# Patient Record
Sex: Male | Born: 1960 | Race: Black or African American | Hispanic: No | Smoking: Never smoker
Health system: Southern US, Community
[De-identification: ages and names within clinical notes are randomized; demographics above are authoritative.]

## PROBLEM LIST (undated history)

## (undated) DIAGNOSIS — N5082 Scrotal pain: Secondary | ICD-10-CM

## (undated) DIAGNOSIS — I891 Lymphangitis: Secondary | ICD-10-CM

## (undated) DIAGNOSIS — M109 Gout, unspecified: Secondary | ICD-10-CM

## (undated) DIAGNOSIS — R9431 Abnormal electrocardiogram [ECG] [EKG]: Secondary | ICD-10-CM

## (undated) DIAGNOSIS — N529 Male erectile dysfunction, unspecified: Secondary | ICD-10-CM

## (undated) DIAGNOSIS — R339 Retention of urine, unspecified: Secondary | ICD-10-CM

## (undated) DIAGNOSIS — I10 Essential (primary) hypertension: Secondary | ICD-10-CM

## (undated) DIAGNOSIS — E860 Dehydration: Secondary | ICD-10-CM

## (undated) DIAGNOSIS — E119 Type 2 diabetes mellitus without complications: Secondary | ICD-10-CM

## (undated) DIAGNOSIS — S63502A Unspecified sprain of left wrist, initial encounter: Secondary | ICD-10-CM

## (undated) DIAGNOSIS — R42 Dizziness and giddiness: Secondary | ICD-10-CM

## (undated) DIAGNOSIS — E785 Hyperlipidemia, unspecified: Secondary | ICD-10-CM

## (undated) HISTORY — DX: Type 2 diabetes mellitus without complications: E11.9

## (undated) HISTORY — PX: APPENDECTOMY: SHX54

## (undated) HISTORY — DX: Scrotal pain: N50.82

## (undated) HISTORY — DX: Abnormal electrocardiogram (ECG) (EKG): R94.31

## (undated) HISTORY — DX: Retention of urine, unspecified: R33.9

## (undated) HISTORY — DX: Unspecified sprain of left wrist, initial encounter: S63.502A

## (undated) HISTORY — DX: Hyperlipidemia, unspecified: E78.5

## (undated) HISTORY — DX: Dehydration: E86.0

## (undated) HISTORY — PX: COLONOSCOPY: SHX174

## (undated) HISTORY — DX: Lymphangitis: I89.1

## (undated) HISTORY — DX: Male erectile dysfunction, unspecified: N52.9

---

## 1988-12-22 HISTORY — PX: INGUINAL HERNIA REPAIR: SUR1180

## 2013-11-09 ENCOUNTER — Emergency Department (HOSPITAL_COMMUNITY)
Admission: EM | Admit: 2013-11-09 | Discharge: 2013-11-09 | Disposition: A | Discharge status: Home / Self Care | Payer: Self-pay | Attending: Emergency Medicine | Admitting: Emergency Medicine

## 2013-11-09 ENCOUNTER — Emergency Department (INDEPENDENT_AMBULATORY_CARE_PROVIDER_SITE_OTHER): Payer: Self-pay

## 2013-11-09 ENCOUNTER — Emergency Department (HOSPITAL_COMMUNITY)
Admission: EM | Admit: 2013-11-09 | Discharge: 2013-11-09 | Disposition: A | Discharge status: Nursing Home (Includes ICF) | Payer: Self-pay | Source: Home / Self Care | Attending: Emergency Medicine | Admitting: Emergency Medicine

## 2013-11-09 ENCOUNTER — Emergency Department (HOSPITAL_COMMUNITY): Payer: Self-pay

## 2013-11-09 ENCOUNTER — Encounter (HOSPITAL_COMMUNITY): Payer: Self-pay | Admitting: Emergency Medicine

## 2013-11-09 DIAGNOSIS — M542 Cervicalgia: Secondary | ICD-10-CM | POA: Insufficient documentation

## 2013-11-09 DIAGNOSIS — R42 Dizziness and giddiness: Secondary | ICD-10-CM

## 2013-11-09 DIAGNOSIS — I10 Essential (primary) hypertension: Secondary | ICD-10-CM | POA: Insufficient documentation

## 2013-11-09 DIAGNOSIS — M436 Torticollis: Secondary | ICD-10-CM

## 2013-11-09 DIAGNOSIS — M109 Gout, unspecified: Secondary | ICD-10-CM | POA: Insufficient documentation

## 2013-11-09 DIAGNOSIS — Z79899 Other long term (current) drug therapy: Secondary | ICD-10-CM | POA: Insufficient documentation

## 2013-11-09 HISTORY — DX: Essential (primary) hypertension: I10

## 2013-11-09 HISTORY — DX: Gout, unspecified: M10.9

## 2013-11-09 HISTORY — DX: Dizziness and giddiness: R42

## 2013-11-09 LAB — CBC WITH DIFFERENTIAL/PLATELET
BASOS ABS: 0 10*3/uL (ref 0.0–0.1)
BASOS PCT: 0 % (ref 0–1)
Basophils Absolute: 0 10*3/uL (ref 0.0–0.1)
Basophils Relative: 0 % (ref 0–1)
EOS ABS: 0.1 10*3/uL (ref 0.0–0.7)
EOS ABS: 0 10*3/uL (ref 0.0–0.7)
EOS PCT: 1 % (ref 0–5)
Eosinophils Relative: 0 % (ref 0–5)
HCT: 41.4 % (ref 39.0–52.0)
HEMATOCRIT: 39.6 % (ref 39.0–52.0)
Hemoglobin: 13.9 g/dL (ref 13.0–17.0)
Hemoglobin: 13.2 g/dL (ref 13.0–17.0)
Lymphocytes Relative: 24 % (ref 12–46)
Lymphocytes Relative: 17 % (ref 12–46)
Lymphs Abs: 2.5 10*3/uL (ref 0.7–4.0)
Lymphs Abs: 1.6 10*3/uL (ref 0.7–4.0)
MCH: 30 pg (ref 26.0–34.0)
MCH: 29.8 pg (ref 26.0–34.0)
MCHC: 33.6 g/dL (ref 30.0–36.0)
MCHC: 33.3 g/dL (ref 30.0–36.0)
MCV: 89.2 fL (ref 78.0–100.0)
MCV: 89.4 fL (ref 78.0–100.0)
MONO ABS: 0.6 10*3/uL (ref 0.1–1.0)
Monocytes Absolute: 0.6 10*3/uL (ref 0.1–1.0)
Monocytes Relative: 6 % (ref 3–12)
Monocytes Relative: 7 % (ref 3–12)
NEUTROS PCT: 76 % (ref 43–77)
Neutro Abs: 7.2 10*3/uL (ref 1.7–7.7)
Neutro Abs: 7.2 10*3/uL (ref 1.7–7.7)
Neutrophils Relative %: 69 % (ref 43–77)
PLATELETS: 319 10*3/uL (ref 150–400)
Platelets: 292 10*3/uL (ref 150–400)
RBC: 4.64 MIL/uL (ref 4.22–5.81)
RBC: 4.43 MIL/uL (ref 4.22–5.81)
RDW: 13.2 % (ref 11.5–15.5)
RDW: 13.3 % (ref 11.5–15.5)
WBC: 10.3 10*3/uL (ref 4.0–10.5)
WBC: 9.5 10*3/uL (ref 4.0–10.5)

## 2013-11-09 LAB — BASIC METABOLIC PANEL
Anion gap: 15 (ref 5–15)
BUN: 18 mg/dL (ref 6–23)
CHLORIDE: 102 meq/L (ref 96–112)
CO2: 25 mEq/L (ref 19–32)
CREATININE: 1.52 mg/dL — AB (ref 0.50–1.35)
Calcium: 9.5 mg/dL (ref 8.4–10.5)
GFR, EST AFRICAN AMERICAN: 59 mL/min — AB (ref >90)
GFR, EST NON AFRICAN AMERICAN: 51 mL/min — AB (ref >90)
Glucose, Bld: 113 mg/dL — ABNORMAL HIGH (ref 70–99)
Potassium: 4.4 mEq/L (ref 3.7–5.3)
Sodium: 142 mEq/L (ref 137–147)

## 2013-11-09 LAB — I-STAT TROPONIN, ED: Troponin i, poc: 0.01 ng/mL (ref 0.00–0.08)

## 2013-11-09 LAB — URIC ACID: URIC ACID, SERUM: 7.9 mg/dL — AB (ref 4.0–7.8)

## 2013-11-09 MED ORDER — SODIUM CHLORIDE 0.9 % IV SOLN
Freq: Once | INTRAVENOUS | Status: AC
  Administered 2013-11-09: 10:00:00 via INTRAVENOUS

## 2013-11-09 MED ORDER — DIAZEPAM 5 MG/ML IJ SOLN
5.0000 mg | Freq: Once | INTRAMUSCULAR | Status: AC
  Administered 2013-11-09: 5 mg via INTRAVENOUS
  Filled 2013-11-09: qty 2

## 2013-11-09 MED ORDER — DIAZEPAM 5 MG PO TABS: 5.0000 mg | ORAL_TABLET | Freq: Two times a day (BID) | ORAL | Status: DC

## 2013-11-09 MED ORDER — LISINOPRIL 20 MG PO TABS
20.0000 mg | ORAL_TABLET | Freq: Every day | ORAL | Status: DC
  Administered 2013-11-09: 20 mg via ORAL
  Filled 2013-11-09: qty 1

## 2013-11-09 MED ORDER — AMLODIPINE BESYLATE 5 MG PO TABS
10.0000 mg | ORAL_TABLET | Freq: Once | ORAL | Status: AC
  Administered 2013-11-09: 10 mg via ORAL
  Filled 2013-11-09: qty 2

## 2013-11-09 MED ORDER — KETOROLAC TROMETHAMINE 30 MG/ML IJ SOLN
30.0000 mg | Freq: Once | INTRAMUSCULAR | Status: AC
  Administered 2013-11-09: 30 mg via INTRAVENOUS
  Filled 2013-11-09: qty 1

## 2013-11-09 MED ORDER — OXYCODONE-ACETAMINOPHEN 5-325 MG PO TABS: 1.0000 | ORAL_TABLET | ORAL | Status: DC | PRN

## 2013-11-09 NOTE — Discharge Instructions (Signed)
Take the prescribed medication as directed. Follow-up with a primary care physician in the area-- resource guide attached to help with this. Return to the ED for new or worsening symptoms-- increasing pain, severe headache, high fever, chills, confusion, disorientation, changes in speech, difficulty ambulating, unilateral weakness, etc.   Emergency Department Resource Guide 1) Find a Doctor and Pay Out of Pocket Although you won't have to find out who is covered by your insurance plan, it is a good idea to ask around and get recommendations. You will then need to call the office and see if the doctor you have chosen will accept you as a new patient and what types of options they offer for patients who are self-pay. Some doctors offer discounts or will set up payment plans for their patients who do not have insurance, but you will need to ask so you aren't surprised when you get to your appointment.  2) Contact Your Local Health Department Not all health departments have doctors that can see patients for sick visits, but many do, so it is worth a call to see if yours does. If you don't know where your local health department is, you can check in your phone book. The CDC also has a tool to help you locate your state's health department, and many state websites also have listings of all of their local health departments.  3) Find a Walk-in Clinic If your illness is not likely to be very severe or complicated, you may want to try a walk in clinic. These are popping up all over the country in pharmacies, drugstores, and shopping centers. They're usually staffed by nurse practitioners or physician assistants that have been trained to treat common illnesses and complaints. They're usually fairly quick and inexpensive. However, if you have serious medical issues or chronic medical problems, these are probably not your best option.  No Primary Care Doctor: - Call Health Connect at  [phone] - they can help  you locate a primary care doctor that  accepts your insurance, provides certain services, etc. - Physician Referral Service- [phone]  Chronic Pain Problems: Organization         Address  Phone   Notes  [facility]  [phone] Patients need to be referred by their primary care doctor.   Medication Assistance: Organization         Address  Phone   Notes  [facility] [address] [place], [place] [address] [phone]) [phone] --Must be a resident of [facility] [place] -- Must have NO insurance coverage whatsoever (no Medicaid/ Medicare, etc.) -- The pt. MUST have a primary care doctor that directs their care regularly and follows them in the community   [facility]ist  [phone]   [employer]  [phone]    Agencies that provide inexpensive medical care: Organization         Address  Phone   Notes  [facility]  [phone]   [facility]    [phone]   [facility] [address] [place], [place] [address] [phone]) [phone]   [facility] [address] [place], [place] [phone]   [facility]    [phone]) [phone]   [facility]    [phone](336) [phone]   [facility]  [address], [place] Phone:  [phone], Fax:  [phone]) [phone] Hours of Operation:  9 am -  6 pm, M-F.  Also accepts Medicaid/Medicare and self-pay.  [facility]  [address], [place] Phone: [phone], Fax: [phone]. Hours of Operation:  8:30 am - 5:30 pm, M-F.  Also accepts Medicaid and self-pay.  [facility] [place] [address], [place] Phone: [phone]   [facility] [address] Natasha Bence [place], [place] [phone], Ext. [phone] Mondays & Thursdays: 7-9 AM.  First 15 patients are seen on a first come, first serve basis.    Medicaid-accepting [facility] Providers:  Organization         Address  Phone   Notes  [facility] [address], [place] [phone] Also accepts self-pay patients.  [facility] [address] Laurell Josephs [place], [place]  [phone]   [facility] [address], [place] [phone]   [facility] [address] [place] [phone]   Renaye Rakers [address], [place]   [phone] Only accepts [place] Access [place] patients after they have their name applied to their card.   Self-Pay (no insurance) in [facility]:  Organization         Address  Phone   Notes  Sickle Cell Patients, [facility] Internal Medicine [address] [place], [place] [phone]   [facility] [address] [place], [place] [phone]   [facility] [place]  [address], [place] [phone]   [facility]/Dr. Osei-Bonsu  [address], [place] or [address], [place] [phone] Phone number for both [place] and [place] locations is the same.  [facility] [address], [place] [phone]   [facility] [address], [place] or [address] [phone]   [facility] [address] [place] [phone], phone; [phone], fax Sees patients 1st and 3rd Saturday of every month.  Must not qualify for public or private insurance (i.e. Medicaid, Medicare, [place] Health Choice, Veterans' Benefits)  Household income should be no more than 200% of the poverty level The clinic cannot treat you if you are pregnant or think you are pregnant  Sexually transmitted diseases are not treated at the clinic.    Dental Care: Organization         Address  Phone  Notes  [facility]  [facility] [address] [place], [place] [phone] Accepts children up to age 87 who are enrolled in [place] or [place] Health Choice; pregnant women with a Medicaid card; and children who have applied for Medicaid or [place] Health Choice, but were declined, whose parents can pay a reduced fee at time of service.  [facility] Department of [facility]  [address], [place] [phone] Accepts children up to age 56 who are enrolled in [place] or [place] Health Choice; pregnant women with a Medicaid card; and children who have applied for Medicaid or [place] Health Choice, but were declined, whose parents can pay a reduced fee at time of service.  [facility]  [address] [place], [place] [phone] Patients are seen by appointment only. Walk-ins are not accepted. [facility] will see patients 74 years of age and  older. Monday - Tuesday (8am-5pm) Most Wednesdays (8:30-5pm) $30 per visit, cash only  [facility] Adult [employer] PROGRAM  [address], [facility] [phone] Patients are seen by appointment only. Walk-ins are not accepted. [facility] will see patients 58 years of age and older. One Wednesday Evening (Monthly: Volunteer Based).  $30 per visit, cash only  [employer]  [phone] for adults; Children under age 21, call Graduate Pediatric Dentistry at [phone]. Children aged 41-14, please call [phone] to request a pediatric application.  Dental services are provided in all areas of dental care including fillings, crowns and bridges, complete and partial dentures, implants, gum treatment, root canals, and extractions. Preventive care is also provided. Treatment is provided to both adults and children. Patients are selected via a lottery and there is often a waiting list.   [facility] [address], [place]  [phone] [email or web address]   [facility] [address] [place], [place] [phone], Ext. [phone] Second and Fourth Thursday of each month, opens at 6:30 AM; Clinic ends at 9 AM.  Patients are seen on a first-come first-served basis, and a limited number are seen during each clinic.   [facility]  [address] Ether Griffins [place], [place] [phone]   Eligibility Requirements You must have lived in [place], [place], or [place] counties for at least the last three months.   You cannot be eligible for state or federal sponsored [place], including [employer], [place], or [employer].   You generally cannot be eligible for healthcare insurance through your employer.    How to apply: Eligibility screenings are held every Tuesday and Wednesday afternoon from 1:00 pm until 4:00 pm. You do not need an appointment for the interview!  [facility] [address] [place], [place] [phone]   [facility] Health Department  [phone]   [facility] Health Department  [phone]   [facility] Health Department  [phone]    Behavioral Health Resources in the Community: Intensive Outpatient Programs Organization         Address  Phone  Notes  [facility] Services [address] [place], [place] [phone]   [facility] Outpatient [address] [place], [place] [phone]   [facility] [address] [place], [place]  [phone]   [facility] Mental Health [address],  [place], [place] [phone] or [phone]   Substance Abuse Resources Organization         Address  Phone  Notes  [facility] Drug Services  [phone]   [facility]  [phone]   [facility] [place]  [phone]   Floydene Flock  [phone]   Residential & Outpatient Substance Abuse Program  [phone]   Psychological Services Organization         Address  Phone  Notes  [facility]  [phone]   [facility] Services  [phone]   [facility] Mental Health [address], [place] [phone] or [phone]    Mobile Crisis Teams Organization         Address  Phone  Notes  [facility], Mobile Crisis Care Unit  [phone]   [facility]  [address] [place], [place] [phone]   [facility] [address] [place] [phone]    Self-Help/Support Groups Organization         Address  Phone             Notes  [facility] [place] - variety of support groups  336- I7437963 Call for more information  Narcotics Anonymous (NA), [facility] [address], [employer] [place]  2 meetings at this location   Statistician         Address  Phone  Notes  [facility] [address],    [place]  [phone]   [facility]  [address] [place] [address], [place], [place] [phone]   [facility] [address] [place], [place] [phone] Admissions: 8am-3pm M-F  [facility] [address],    [place], [place] [phone]   [facility] [address] [place], [place], [place] [phone]   [facility] [address]  [place], [place] [phone]   [facility] - Intensive Outpatient [address] Laurell Josephs [address], [place], [place] [phone]   [facility] ([facility] [address]  [place], [place] [phone] or [phone]   [facility] ([facility]) [address], [place], [place] [phone] Accepts Medicaid  [facility] [address]  [place] [phone] Substance Abuse/Addiction Treatment   [facility] Organization         Address  Phone  Notes  [facility]  [phone]   Angie Fava, PhD [address] Ervin Knack [place], [place]   (617) [phone] or  9298588434   [facility] Behavioral   [address] [place], [place] [phone]   [facility] [address] [place], [place] [phone] Insurance/Medicaid/sponsorship through [facility] [address]                                    [place], [place] [phone] Therapy/tele-psych/case  [facility] [address], [place] [phone]    Dr. Lolly Mustache  [phone]   [facility]  [employer] [facility] Dept. 1) [address] [place] 2) [address] [place] 3)  [address], [place] [phone]  [phone]   [facility] Child Abuse Hotline 580-562-8822 or [phone] (After Hours)

## 2013-11-09 NOTE — ED Provider Notes (Signed)
Medical screening examination/treatment/procedure(s) were performed by non-physician practitioner and as supervising physician I was immediately available for consultation/collaboration.   EKG Interpretation None      Pt sent for UC to r/o meningitis due to neck pain/stiffness. Pt has recently changed pillow an mattress, has had pain for about 1 week intermittently. He has BL stiffness in SCM's, non meningismus.  No fevers. Negative kernig's and Brudzinski's.  Pt does not clinically have concern for meningitis and does not need LP.   Shanna CiscoMegan E Docherty, MD 11/09/13 [phone]

## 2013-11-09 NOTE — ED Provider Notes (Signed)
Medical screening examination/treatment/procedure(s) were performed by resident physician or non-physician practitioner and as supervising physician I was immediately available for consultation/collaboration.  Randal BubaErin Lacosta Hargan, MD     Charm RingsErin J Jamy Whyte, MD 11/09/13 1556

## 2013-11-09 NOTE — ED Provider Notes (Signed)
CSN: [id number]     Arrival date & time 11/09/13  [id number] History   First MD Initiated Contact with Patient 11/09/13 0915     Chief Complaint  Patient presents with  . Torticollis  . Medication Refill   (Consider location/radiation/quality/duration/timing/severity/associated sxs/prior Treatment) HPI Comments: 53 year old male presents complaining of neck stiffness and pain, and also having run out of his blood pressure medications. He has had neck stiffness and pain for about 2 weeks. It has been constant, not worsening nor improving. He has pain with any attempted movement of his neck. Nothing makes it any better or worse. He does admit to getting a new bed and pillows about one month ago. He denies any trauma to his neck. He thinks that he is having gout in his neck, he has a long history of gout and in the past month has had a flare in his left elbow and right knee. He also admits to some very mild lightheadedness when he first sits up in bed. He denies any headache, back pain, nausea, vomiting, rash, fever, chest pain, shortness of breath, or any other symptoms.   53 year old male presents for evaluation  Past Medical History  Diagnosis Date  . Hypertension   . Gout    History reviewed. No pertinent past surgical history. No family history on file. History  Substance Use Topics  . Smoking status: Never Smoker   . Smokeless tobacco: Not on file  . Alcohol Use: No    Review of Systems  Musculoskeletal: Positive for neck pain and neck stiffness.  Neurological: Positive for dizziness.  All other systems reviewed and are negative.   Allergies  Review of patient's allergies indicates no known allergies.  Home Medications   Prior to Admission medications   Medication Sig Start Date End Date Taking? Authorizing Provider  amLODipine (NORVASC) 10 MG tablet Take 10 mg by mouth daily.   Yes Historical Provider, MD  lisinopril (PRINIVIL,ZESTRIL) 20 MG tablet Take 20 mg by mouth daily.    Yes Historical Provider, MD  tamsulosin (FLOMAX) 0.4 MG CAPS capsule Take 0.4 mg by mouth.   Yes Historical Provider, MD   Filed Vitals:   11/09/13 0901 11/09/13 0940  BP: 144/111 145/85  Pulse: 120 116  Temp: 99.2 F (37.3 C) 99 F (37.2 C)  TempSrc: Oral Oral  Resp: 18 16  Height: 5\' 10"  (1.778 m)   Weight: 230 lb (104.327 kg)   SpO2: 94% 95%     BP 145/85  Pulse 116  Temp(Src) 99 F (37.2 C) (Oral)  Resp 16  Ht 5\' 10"  (1.778 m)  Wt 230 lb (104.327 kg)  BMI 33.00 kg/m2  SpO2 95% Physical Exam  Nursing note and vitals reviewed. Constitutional: He is oriented to person, place, and time. He appears well-developed and well-nourished. No distress.  HENT:  Head: Normocephalic.  Neck: Trachea normal. No JVD present. Spinous process tenderness and muscular tenderness present. Rigidity present. Decreased range of motion (Entire range of motion between 15 and 60 flexion) present. No edema present. No Brudzinski's sign and no Kernig's sign noted.  Cardiovascular: Regular rhythm and normal heart sounds.  Tachycardia present.   Pulses:      Radial pulses are 1+ on the left side.  Pulmonary/Chest: Effort normal and breath sounds normal. No respiratory distress.  Neurological: He is alert and oriented to person, place, and time. Coordination normal.  Skin: Skin is warm and dry. No rash noted. He is not diaphoretic.  Psychiatric: He has a  normal mood and affect. Judgment normal.    ED Course  ED EKG  Date/Time: 11/09/2013 9:58 AM Performed by: Autumn Messing, H Authorized by: Autumn Messing, H Rhythm: sinus rhythm Rate: normal QRS axis: normal Conduction: conduction normal ST Segments: ST segments normal T Waves: T waves normal Other findings: prolonged QTc interval Clinical impression: abnormal ECG   (including critical care time) Labs Review Labs Reviewed  CBC WITH DIFFERENTIAL  URIC ACID    Imaging Review No results found.   MDM   1. Neck stiffness   2.  Dizziness    While attempting to get x-rays of the neck, patient became extremely lightheaded. Radial pulse was faint. He was laid down into November and continues to feel very faint with a thready pulse for a period of 5 minutes until the decision was made to transfer to the emergency department for further evaluation. An EKG that was obtained after this episode of near syncope revealed a heart rate of 78, we did not get an EKG while the patient was having a heart rate of 115 to 120. Given his neck stiffness with abnormal vital signs, this patient needs more workup, possibly needs to have meningitis excluded, and needs further cardiac monitoring to rule out intermittent dysrhythmia. He is being transferred to the emergency department via [facility]. IV started, started 1L bolus, cardiac monitoring. 2 L oxygen via nasal cannula for hypoxemia.      Graylon Good, PA-C 11/09/13 1052

## 2013-11-09 NOTE — ED Notes (Addendum)
During x-ray patient became dizzy and diaphoretic.  Provider notified.  Patient taken to PR2, placed in trendelenburg, VS and EKG were performed

## 2013-11-09 NOTE — ED Notes (Signed)
Report given to [facility]. 

## 2013-11-09 NOTE — ED Notes (Signed)
Per [facility]- [facility] for [facility] of neck pain and stiffness. Pt states that this has been ongoing for 2 weeks and denies any known injury. Pt is also out of BP medications for approx 2 weeks as well. [place] and has not been able to fill medication.

## 2013-11-09 NOTE — ED Provider Notes (Signed)
CSN: [id number]     Arrival date & time 11/09/13  1051 History   First MD Initiated Contact with Patient 11/09/13 1106     Chief Complaint  Patient presents with  . Torticollis     (Consider location/radiation/quality/duration/timing/severity/associated sxs/prior Treatment) The history is provided by the patient and medical records.   This is a 53 year old male with past medical history significant for hypertension, gout, presenting to the ED from urgent care for further evaluation of neck pain. Patient reports that this has been intermittent over the past week. He states pain is worse along his posterior neck, equally painful on both sides. He endorses an intermittent, low-grade headache over the past week as well that resolves with over-the-counter medications.  Denies current headache.  No fever or chills.  Denies any photophobia, phonophobia, aura, changes in speech, difficulty in relating, tinnitus, or confusion. Patient does admit to getting a new bed and pillows about one month ago since moving here from [place].  States he has hx of gout in his right foot and pain in his neck feels the same way.  Denies recent injuries, trauma, or falls. No prior neck injuries or surgeries.  No epidural injections.  Pt also notes he has been out of his blood pressure medications, amlodipine and lisinopril, for the past 2 weeks. States blood pressure is usually well controlled when he takes these medications.  While getting x-ray of [facility] spine at [facility] patient became dizzy while standing, but did not have full syncope.  He was sent to the ED for further evaluation.  VS stable on arrival.  Past Medical History  Diagnosis Date  . Hypertension   . Gout   . Dizziness    Past Surgical History  Procedure Laterality Date  . Appendectomy     No family history on file. History  Substance Use Topics  . Smoking status: Never Smoker   . Smokeless tobacco: Not on file  . Alcohol Use: Yes    Review of  Systems  Musculoskeletal: Positive for neck pain.  Neurological: Positive for dizziness.  All other systems reviewed and are negative.     Allergies  Review of patient's allergies indicates no known allergies.  Home Medications   Prior to Admission medications   Medication Sig Start Date End Date Taking? Authorizing Provider  amLODipine (NORVASC) 10 MG tablet Take 10 mg by mouth daily.   Yes Historical Provider, MD  lisinopril (PRINIVIL,ZESTRIL) 20 MG tablet Take 20 mg by mouth daily.   Yes Historical Provider, MD  tamsulosin (FLOMAX) 0.4 MG CAPS capsule Take 0.4 mg by mouth daily.    Yes Historical Provider, MD   BP 150/119  Pulse 105  Temp(Src) 97.7 F (36.5 C) (Oral)  Resp 22  SpO2 95%  Physical Exam  Nursing note and vitals reviewed. Constitutional: He is oriented to person, place, and time. He appears well-developed and well-nourished.  Non-toxic appearance. No distress.  Awake, alert, non-toxic appearing  HENT:  Head: Normocephalic and atraumatic.  Mouth/Throat: Oropharynx is clear and moist.  Eyes: Conjunctivae and EOM are normal. Pupils are equal, round, and reactive to light.  Neck: Trachea normal and phonation normal. Spinous process tenderness and muscular tenderness present. No rigidity. No Brudzinski's sign and no Kernig's sign noted.  No nuchal rigidity or meningeal signs; tenderness along cervical spine and paraspinal muscles with significant spasm present along SCM's bilaterally; fully able to place chin on chest  Cardiovascular: Normal rate, regular rhythm and normal heart sounds.  Pulmonary/Chest: Effort normal and breath sounds normal. No respiratory distress. He has no wheezes.  Abdominal: Soft. Bowel sounds are normal. There is no tenderness. There is no guarding.  Musculoskeletal: Normal range of motion. He exhibits no edema.  Neurological: He is alert and oriented to person, place, and time.  AAOx3, answering questions appropriately; equal strength UE  and LE bilaterally; CN grossly intact; moves all extremities appropriately without ataxia; no focal neuro deficits or facial asymmetry appreciated  Skin: Skin is warm and dry. He is not diaphoretic.  Psychiatric: He has a normal mood and affect.    ED Course  Procedures (including critical care time) Labs Review Labs Reviewed  BASIC METABOLIC PANEL - Abnormal; Notable for the following:    Glucose, Bld 113 (*)    Creatinine, Ser 1.52 (*)    GFR calc non Af Amer 51 (*)    GFR calc Af Amer 59 (*)    All other components within normal limits  CBC WITH DIFFERENTIAL  I-STAT TROPOININ, ED    Imaging Review Dg Cervical Spine 2 Or 3 Views  11/09/2013   CLINICAL DATA:  Neck pain for 2 weeks  EXAM: CERVICAL SPINE - 2-3 VIEW  COMPARISON:  None.  FINDINGS: The cervicothoracic junction is not optimally visualized due to obscuration by soft tissue structures. Alignment is normal. No precervical soft tissue widening. No fracture or dislocation identified. Neural foramina are patent.  IMPRESSION: No acute osseous abnormality of the cervical spine. The patient experienced diaphoresis and dizziness during the examination and was [facility] for further evaluation in stable condition.   Electronically Signed   By: Christiana PellantGretchen  Green M.D.   On: 11/09/2013 10:09     EKG Interpretation None      MDM   Final diagnoses:  Neck pain  Essential hypertension   53 year old male sent to the ED from [facility] for further evaluation of neck pain.  [facility], no syncope or falls.  On arrival to ED, pt states he is feeling fine but felt that he may have stood up too quickly which caused his dizziness.  He denies current headache, fever, sweats, chills, chest pain, SOB, palpitations, dizziness, visual disturbance, confusion, or weakness.  On exam, his neck feels tense with significant spasm present along his SCM muscles  bilaterally but he is able to fully place his chin on his chest and does not have any appreciable nuchal rigidity.  His neurologic exam is non-focal.  Have ordered IV toradol and valium.  Will obtain EKG, basic labs, and cxr.  C-spine films from UC reviewed-- no acute findings.  EKG sinus rhythm without ischemic change. Troponin is negative.  CXR is clear.  Labs with mild renal insufficiency, unknown baseline as no prior labs for comparison.  After medications, the patient feels that his neck pain has improved significantly. He is now able to range his neck more freely.  He remains afebrile and non-toxic appearing.  Neurologic exam remains intact.  At this time i have low suspicion for acute intracranial pathology including, but not limited to, TIA, stroke, ICH, SAH, or meningitis.  Will plan to discharge home with pain meds and supportive care.  Encouraged to monitor for worsening sx-- severe headache, intense pain, visual changes, weakness, high fever, chills, sweats, etc and will return to the ED if any of these sx occur.  Discussed plan with patient, he/she acknowledged understanding and agreed with plan  of care.  Return precautions given for new or worsening symptoms.  Case discussed with Dr. Micheline Maze who personally evaluated pt and agrees with assessment and plan of care.  Garlon Hatchet, PA-C 11/09/13 1511

## 2013-11-09 NOTE — ED Notes (Signed)
Pt c/o stiff neck x2 weeks Denies inj/trauma... Hx of gout Also needing refill on BP meds; BP today is 144/111 Last had BP meds yest Denies SOB, CP, weakness, v/n, diaphoresis Talking in complete sentences w/no signs of acute distress.

## 2013-11-11 NOTE — Discharge Planning (Signed)
[facility] Liaison was not able to see pt, [facility] orange card information will be sent to the address listed. °

## 2015-01-03 ENCOUNTER — Encounter: Payer: Self-pay | Admitting: Internal Medicine

## 2015-01-28 ENCOUNTER — Ambulatory Visit (AMBULATORY_SURGERY_CENTER): Payer: Self-pay

## 2015-01-28 VITALS — Ht 70.0 in | Wt 240.2 lb

## 2015-01-28 DIAGNOSIS — Z1211 Encounter for screening for malignant neoplasm of colon: Secondary | ICD-10-CM

## 2015-01-28 MED ORDER — SUPREP BOWEL PREP KIT 17.5-3.13-1.6 GM/177ML PO SOLN: 1.0000 | Freq: Once | ORAL | Status: DC

## 2015-01-28 NOTE — Progress Notes (Signed)
No allergies to eggs or soy No diet/weight loss meds No home oxygen No past problems to anesthesia  Refused emmi; wont watch it

## 2015-02-09 ENCOUNTER — Ambulatory Visit (AMBULATORY_SURGERY_CENTER): Payer: 59 | Admitting: Internal Medicine

## 2015-02-09 ENCOUNTER — Encounter: Payer: Self-pay | Admitting: Internal Medicine

## 2015-02-09 VITALS — BP 141/108 | HR 91 | Temp 97.8°F | Resp 30 | Wt 240.0 lb

## 2015-02-09 DIAGNOSIS — Z1211 Encounter for screening for malignant neoplasm of colon: Secondary | ICD-10-CM

## 2015-02-09 MED ORDER — SODIUM CHLORIDE 0.9 % IV SOLN: 500.0000 mL | INTRAVENOUS | Status: DC

## 2015-02-09 NOTE — Op Note (Signed)
[facility] [address] [place], [id number]   COLONOSCOPY PROCEDURE REPORT  PATIENT: Tony Dean, Tony Dean  MR#: [id number] BIRTHDATE: [birth year] , 54  yrs. old GENDER: male ENDOSCOPIST: Beverley FiedlerJay M Ceri Mayer, MD REFERRED AO:ZHYQMBY:Edwin Concepcion ElkAvbuere, M.D. PROCEDURE DATE:  02/09/2015 PROCEDURE:   Colonoscopy, screening First Screening Colonoscopy - Avg.  risk and is 50 yrs.  old or older Yes.  Prior Negative Screening - Now for repeat screening. N/A  History of Adenoma - Now for follow-up colonoscopy & has been > or = to 3 yrs.  N/A  Polyps removed today? No Recommend repeat exam, <10 yrs? Yes poor prep ASA CLASS:   Class II INDICATIONS:Screening for colonic neoplasia, Colorectal Neoplasm Risk Assessment for this procedure is average risk, and 1st colonoscopy. MEDICATIONS: Monitored anesthesia care and Propofol 250 mg IV  DESCRIPTION OF PROCEDURE:   After the risks benefits and alternatives of the procedure were thoroughly explained, informed consent was obtained.  The digital rectal exam revealed no rectal mass.   The LB [id number]  endoscope was introduced through the anus and advanced to the cecum, which was identified by both the appendix and ileocecal valve. No adverse events experienced. The quality of the prep was fair.  (Suprep was used)  The instrument was then slowly withdrawn as the colon was fully examined. Estimated blood loss is zero unless otherwise noted in this procedure report.   COLON FINDINGS: Fair bowel preparation requiring copious irrigation and lavage. Despite irrigation and lavage there was adherent stool which can limit detection of small lesions.  A normal appearing cecum, ileocecal valve, and appendiceal orifice were identified. The ascending, transverse, descending, sigmoid colon, and rectum appeared unremarkable.  Retroflexed views revealed internal hemorrhoids. The time to cecum = 1.8 Withdrawal time = 12.9   The scope was withdrawn and the  procedure completed.  COMPLICATIONS: There were no immediate complications.  ENDOSCOPIC IMPRESSION: Normal colonoscopy with fair preparation  RECOMMENDATIONS: Given bowel preparation today, limiting the detection of small lesions, would recommend repeat colonoscopy in 3 years versus Cologuard at that time  eSigned:  Beverley FiedlerJay M Cythnia Osmun, MD 02/09/2015 2:01 PM   cc: Fleet ContrasEdwin Avbuere, MD and The Patient

## 2015-02-09 NOTE — Patient Instructions (Signed)
Discharge instructions given. Normal exam. Resume previous medications. YOU HAD AN ENDOSCOPIC PROCEDURE TODAY AT THE [facility]:   Refer to the procedure report that was given to you for any specific questions about what was found during the examination.  If the procedure report does not answer your questions, please call your gastroenterologist to clarify.  If you requested that your care partner not be given the details of your procedure findings, then the procedure report has been included in a sealed envelope for you to review at your convenience later.  YOU SHOULD EXPECT: Some feelings of bloating in the abdomen. Passage of more gas than usual.  Walking can help get rid of the air that was put into your GI tract during the procedure and reduce the bloating. If you had a lower endoscopy (such as a colonoscopy or flexible sigmoidoscopy) you may notice spotting of blood in your stool or on the toilet paper. If you underwent a bowel prep for your procedure, you may not have a normal bowel movement for a few days.  Please Note:  You might notice some irritation and congestion in your nose or some drainage.  This is from the oxygen used during your procedure.  There is no need for concern and it should clear up in a day or so.  SYMPTOMS TO REPORT IMMEDIATELY:   Following lower endoscopy (colonoscopy or flexible sigmoidoscopy):  Excessive amounts of blood in the stool  Significant tenderness or worsening of abdominal pains  Swelling of the abdomen that is new, acute  Fever of 100F or higher   For urgent or emergent issues, a gastroenterologist can be reached at any hour by calling [phone].   DIET: Your first meal following the procedure should be a small meal and then it is ok to progress to your normal diet. Heavy or fried foods are harder to digest and may make you feel nauseous or bloated.  Likewise, meals heavy in dairy and vegetables can increase bloating.  Drink plenty  of fluids but you should avoid alcoholic beverages for 24 hours.  ACTIVITY:  You should plan to take it easy for the rest of today and you should NOT DRIVE or use heavy machinery until tomorrow (because of the sedation medicines used during the test).    FOLLOW UP: Our staff will call the number listed on your records the next business day following your procedure to check on you and address any questions or concerns that you may have regarding the information given to you following your procedure. If we do not reach you, we will leave a message.  However, if you are feeling well and you are not experiencing any problems, there is no need to return our call.  We will assume that you have returned to your regular daily activities without incident.  If any biopsies were taken you will be contacted by phone or by letter within the next 1-3 weeks.  Please call us at [phone] if you have not heard about the biopsies in 3 weeks.    SIGNATURES/CONFIDENTIALITY: You and/or your care partner have signed paperwork which will be entered into your electronic medical record.  These signatures attest to the fact that that the information above on your After Visit Summary has been reviewed and is understood.  Full responsibility of the confidentiality of this discharge information lies with you and/or your care-partner. 

## 2015-02-09 NOTE — Progress Notes (Signed)
A/ox3 pleased with MAC, report to Celia RN 

## 2015-02-10 ENCOUNTER — Telehealth: Payer: Self-pay | Admitting: *Deleted

## 2015-02-10 NOTE — Telephone Encounter (Signed)
#   left by pt for callback said this number can not be reached at this time

## 2015-10-19 ENCOUNTER — Emergency Department (HOSPITAL_COMMUNITY)
Admission: EM | Admit: 2015-10-19 | Discharge: 2015-10-19 | Disposition: A | Discharge status: Home / Self Care | Payer: 59 | Attending: Emergency Medicine | Admitting: Emergency Medicine

## 2015-10-19 ENCOUNTER — Encounter (HOSPITAL_COMMUNITY): Payer: Self-pay | Admitting: Emergency Medicine

## 2015-10-19 DIAGNOSIS — R000 Tachycardia, unspecified: Secondary | ICD-10-CM | POA: Insufficient documentation

## 2015-10-19 DIAGNOSIS — I10 Essential (primary) hypertension: Secondary | ICD-10-CM

## 2015-10-19 DIAGNOSIS — M10031 Idiopathic gout, right wrist: Secondary | ICD-10-CM | POA: Insufficient documentation

## 2015-10-19 DIAGNOSIS — M10071 Idiopathic gout, right ankle and foot: Secondary | ICD-10-CM | POA: Insufficient documentation

## 2015-10-19 DIAGNOSIS — M1009 Idiopathic gout, multiple sites: Secondary | ICD-10-CM

## 2015-10-19 DIAGNOSIS — Z79899 Other long term (current) drug therapy: Secondary | ICD-10-CM | POA: Insufficient documentation

## 2015-10-19 MED ORDER — PREDNISONE 20 MG PO TABS
60.0000 mg | ORAL_TABLET | Freq: Once | ORAL | Status: AC
  Administered 2015-10-19: 60 mg via ORAL
  Filled 2015-10-19: qty 3

## 2015-10-19 MED ORDER — IBUPROFEN 400 MG PO TABS: 400.0000 mg | ORAL_TABLET | Freq: Three times a day (TID) | ORAL | Status: DC | PRN

## 2015-10-19 MED ORDER — HYDROCODONE-ACETAMINOPHEN 5-325 MG PO TABS
1.0000 | ORAL_TABLET | Freq: Four times a day (QID) | ORAL | Status: DC | PRN
Start: 2015-10-19 — End: 2017-07-01

## 2015-10-19 MED ORDER — PREDNISONE 20 MG PO TABS: ORAL_TABLET | ORAL | Status: DC

## 2015-10-19 MED ORDER — HYDROCODONE-ACETAMINOPHEN 5-325 MG PO TABS
1.0000 | ORAL_TABLET | Freq: Once | ORAL | Status: AC
  Administered 2015-10-19: 1 via ORAL
  Filled 2015-10-19: qty 1

## 2015-10-19 NOTE — Discharge Instructions (Signed)
Ice and elevate ankle and wrist throughout the day, using ice pack for no more than 20 minutes every hour.  Alternate between ibuprofen and norco for pain relief. Do not drive or operate machinery with pain medication use. Take prednisone as directed, beginning tomorrow since you already got today's dose here in the ER; this will help with gout flare. AVOID ALCOHOL! Avoid red meat and seafood consumption. Follow up with your regular doctor in 1 week for recheck and ongoing management of your gout. Return to the ER for changes or worsening symptoms.   Gout Gout is when your joints become red, sore, and swell (inflamed). This is caused by the buildup of uric acid crystals in the joints. Uric acid is a chemical that is normally in the blood. If the level of uric acid gets too high in the blood, these crystals form in your joints and tissues. Over time, these crystals can form into masses near the joints and tissues. These masses can destroy bone and cause the bone to look misshapen (deformed). HOME CARE   Do not take aspirin for pain.  Only take medicine as told by your doctor.  Rest the joint as much as you can. When in bed, keep sheets and blankets off painful areas.  Keep the sore joints raised (elevated).  Put warm or cold packs on painful joints. Use of warm or cold packs depends on which works best for you.  Use crutches if the painful joint is in your leg.  Drink enough fluids to keep your pee (urine) clear or pale yellow. Limit alcohol, sugary drinks, and drinks with fructose in them.  Follow your diet instructions. Pay careful attention to how much protein you eat. Include fruits, vegetables, whole grains, and fat-free or low-fat milk products in your daily diet. Talk to your doctor or dietitian about the use of coffee, vitamin C, and cherries. These may help lower uric acid levels.  Keep a healthy body weight. GET HELP RIGHT AWAY IF:   You have watery poop (diarrhea), throw up  (vomit), or have any side effects from medicines.  You do not feel better in 24 hours, or you are getting worse.  Your joint becomes suddenly more tender, and you have chills or a fever. MAKE SURE YOU:   Understand these instructions.  Will watch your condition.  Will get help right away if you are not doing well or get worse.   This information is not intended to replace advice given to you by your health care provider. Make sure you discuss any questions you have with your health care provider.   Document Released: 01/17/2008 Document Revised: 04/30/2014 Document Reviewed: 11/21/2011 Elsevier Interactive Patient Education 2016 Elsevier Inc.  Low-Purine Diet Purines are compounds that affect the level of uric acid in your body. A low-purine diet is a diet that is low in purines. Eating a low-purine diet can prevent the level of uric acid in your body from getting too high and causing gout or kidney stones or both. WHAT DO I NEED TO KNOW ABOUT THIS DIET?  Choose low-purine foods. Examples of low-purine foods are listed in the next section.  Drink plenty of fluids, especially water. Fluids can help remove uric acid from your body. Try to drink 8-16 cups (1.9-3.8 L) a day.  Limit foods high in fat, especially saturated fat, as fat makes it harder for the body to get rid of uric acid. Foods high in saturated fat include pizza, cheese, ice cream, whole  milk, fried foods, and gravies. Choose foods that are lower in fat and lean sources of protein. Use olive oil when cooking as it contains healthy fats that are not high in saturated fat.  Limit alcohol. Alcohol interferes with the elimination of uric acid from your body. If you are having a gout attack, avoid all alcohol.  Keep in mind that different people's bodies react differently to different foods. You will probably learn over time which foods do or do not affect you. If you discover that a food tends to cause your gout to flare up,  avoid eating that food. You can more freely enjoy foods that do not cause problems. If you have any questions about a food item, talk to your dietitian or health care provider. WHICH FOODS ARE LOW, MODERATE, AND HIGH IN PURINES? The following is a list of foods that are low, moderate, and high in purines. You can eat any amount of the foods that are low in purines. You may be able to have small amounts of foods that are moderate in purines. Ask your health care provider how much of a food moderate in purines you can have. Avoid foods high in purines. Grains  Foods low in purines: Enriched white bread, pasta, rice, cake, cornbread, popcorn.  Foods moderate in purines: Whole-grain breads and cereals, wheat germ, bran, oatmeal. Uncooked oatmeal. Dry wheat bran or wheat germ.  Foods high in purines: Pancakes, [place] toast, biscuits, muffins. Vegetables  Foods low in purines: All vegetables, except those that are moderate in purines.  Foods moderate in purines: Asparagus, cauliflower, spinach, mushrooms, green peas. Fruits  All fruits are low in purines. Meats and other Protein Foods  Foods low in purines: Eggs, nuts, peanut butter.  Foods moderate in purines: 80-90% lean beef, lamb, veal, pork, poultry, fish, eggs, peanut butter, nuts. Crab, lobster, oysters, and shrimp. Cooked dried beans, peas, and lentils.  Foods high in purines: Anchovies, sardines, herring, mussels, tuna, codfish, scallops, trout, and haddock. Tomasa BlaseBacon. Organ meats (such as liver or kidney). Tripe. Game meat. Goose. Sweetbreads. Dairy  All dairy foods are low in purines. Low-fat and fat-free dairy products are best because they are low in saturated fat. Beverages  Drinks low in purines: Water, carbonated beverages, tea, coffee, cocoa.  Drinks moderate in purines: Soft drinks and other drinks sweetened with high-fructose corn syrup. Juices. To find whether a food or drink is sweetened with high-fructose corn syrup, look  at the ingredients list.  Drinks high in purines: Alcoholic beverages (such as beer). Condiments  Foods low in purines: Salt, herbs, olives, pickles, relishes, vinegar.  Foods moderate in purines: Butter, margarine, oils, mayonnaise. Fats and Oils  Foods low in purines: All types, except gravies and sauces made with meat.  Foods high in purines: Gravies and sauces made with meat. Other Foods  Foods low in purines: Sugars, sweets, gelatin. Cake. Soups made without meat.  Foods moderate in purines: Meat-based or fish-based soups, broths, or bouillons. Foods and drinks sweetened with high-fructose corn syrup.  Foods high in purines: High-fat desserts (such as ice cream, cookies, cakes, pies, doughnuts, and chocolate). Contact your dietitian for more information on foods that are not listed here.   This information is not intended to replace advice given to you by your health care provider. Make sure you discuss any questions you have with your health care provider.   Document Released: 08/04/2010 Document Revised: 04/14/2013 Document Reviewed: 03/16/2013 Elsevier Interactive Patient Education [employer]2016 Elsevier Inc.

## 2015-10-19 NOTE — ED Provider Notes (Signed)
CSN: [id number]     Arrival date & time 10/19/15  2106 History  By signing my name below, I, Placido SouLogan Joldersma, attest that this documentation has been prepared under the direction and in the presence of Tilley Faeth Camprubi-Soms, PA-C. Electronically Signed: Placido SouLogan Joldersma, ED Scribe. 10/19/2015. 9:48 PM.   Chief Complaint  Patient presents with  . Gout   Patient is a 55 y.o. male presenting with ankle pain. The history is provided by the patient and medical records. No language interpreter was used.  Ankle Pain Location:  Ankle Time since incident:  1 week Injury: no   Ankle location:  R ankle Pain details:    Quality:  Sharp   Radiates to:  Does not radiate   Severity:  Moderate   Onset quality:  Gradual   Duration:  1 week   Timing:  Intermittent   Progression:  Improving Chronicity:  Recurrent Prior injury to area:  No Relieved by:  Nothing Worsened by:  Bearing weight and activity Ineffective treatments:  Acetaminophen Associated symptoms: decreased ROM (due to pain in R wrist), stiffness and swelling   Associated symptoms: no fever, no muscle weakness, no numbness and no tingling   Risk factors: no concern for non-accidental trauma and no obesity     HPI Comments: Tony HusbandsKenneth Dean is a 55 y.o. male with a PMHx of HTN and gout, who presents to the Emergency Department complaining of gout flare in his R ankle and R wrist. States that his symptoms began after consuming ETOH 1wk ago, first in his R ankle, although that has improved, and then went into his R hand/wrist. States this feels like prior gout flares, and he has had gout issues in these locations in the past. Describes the R wrist/hand pain as gradually worsening, intermittent, sharp, non-radiating, 10/10, right wrist pain, worsens with movement, and unrelieved with tylenol. Associated symptoms include swelling of the R ankle and wrist, and limited ROM of the R wrist due to pain. Pt states his last gout flare up was 1 year ago,  went to an outside hospital for tx at that time, and is unsure of whether or not they rx him a medication for his symptoms. He is not on anything for his gout. Pt denies a PMHx of DM and confirms a PMHx of HTN noting that he has taken his rx medication  this morning, but can't recall the name of the medication since he states it is newly changed. He denies any known drug allergies. He denies joint warmth, redness, fevers, chills, CP, SOB, abd pain, n/v/d/c, dysuria, hematuria, weakness, numbness, tingling, or any other associated symptoms. Of note, Upon chart review pt on 11/09/2013 pt had uric acid levels performed which were elevated.    Past Medical History  Diagnosis Date  . Hypertension   . Gout   . Dizziness    Past Surgical History  Procedure Laterality Date  . Appendectomy    . Inguinal hernia repair  1990's    right   Family History  Problem Relation Age of Onset  . Colon cancer Neg Hx    Social History  Substance Use Topics  . Smoking status: Never Smoker   . Smokeless tobacco: Never Used  . Alcohol Use: 13.2 - 14.4 oz/week    14 Cans of beer, 8-10 Shots of liquor per week    Review of Systems  Constitutional: Negative for fever and chills.  Respiratory: Negative for shortness of breath.   Cardiovascular: Negative for chest pain.  Gastrointestinal: Negative for nausea, vomiting, abdominal pain, diarrhea and constipation.  Genitourinary: Negative for dysuria and hematuria.  Musculoskeletal: Positive for joint swelling, arthralgias and stiffness. Negative for myalgias.  Skin: Negative for color change and wound.  Allergic/Immunologic: Negative for immunocompromised state.  Neurological: Negative for weakness and numbness.  Psychiatric/Behavioral: Negative for confusion.  10 Systems reviewed and are negative for acute change except as noted in the HPI.   Allergies  Review of patient's allergies indicates no known allergies.  Home Medications   Prior to Admission  medications   Medication Sig Start Date End Date Taking? Authorizing Provider  lisinopril (PRINIVIL,ZESTRIL) 20 MG tablet Take 20 mg by mouth daily.    Historical Provider, MD  Multiple Vitamins-Minerals (MENS 50+ MULTI VITAMIN/MIN PO) Take by mouth.    Historical Provider, MD   BP 151/118 mmHg  Pulse 111  Temp(Src) 98 F (36.7 C)  Resp 24  Ht  (1.778 m)  Wt 240 lb (108.863 kg)  BMI 34.44 kg/m2  SpO2 97%    Physical Exam  Constitutional: He is oriented to person, place, and time. Vital signs are normal. He appears well-developed and well-nourished.  Non-toxic appearance. No distress.  Afebrile, nontoxic, NAD  HENT:  Head: Normocephalic and atraumatic.  Mouth/Throat: Mucous membranes are normal.  Eyes: Conjunctivae and EOM are normal. Right eye exhibits no discharge. Left eye exhibits no discharge.  Neck: Normal range of motion. Neck supple.  Cardiovascular: Intact distal pulses.  Tachycardia present.   Mild tachycardia noted in triage which improved during exam.   Pulmonary/Chest: Effort normal. No respiratory distress.  Abdominal: Normal appearance. He exhibits no distension.  Musculoskeletal:       Right wrist: He exhibits decreased range of motion (due to pain), tenderness and swelling. He exhibits no crepitus, no deformity and no laceration.       Right ankle: He exhibits swelling. He exhibits normal range of motion, no deformity, no laceration and normal pulse. No tenderness. Achilles tendon normal.  Right wrist with limited ROM due to pain although still able to passively flex and extend approximately 50%, with mild diffuse tenderness in the wrist and hand without any focal bony TTP, no erythema or warmth, no crepitus or deformity, with mild swelling to the wrist. Skin intact. Right ankle with FROM intact, no focal TTP, mildly swollen without erythema or warmth, no pedal edema, no crepitus or deformity.  Strength and sensation grossly intact in all extremities, distal  pulses intact.   Neurological: He is alert and oriented to person, place, and time. He has normal strength. No sensory deficit.  Skin: Skin is warm, dry and intact. No rash noted.  Psychiatric: He has a normal mood and affect.  Nursing note and vitals reviewed.   ED Course  Procedures  DIAGNOSTIC STUDIES: Oxygen Saturation is 97% on RA, normal by my interpretation.    COORDINATION OF CARE: 9:46 PM Discussed next steps with pt. Pt verbalized understanding and is agreeable with the plan.   Labs Review Labs Reviewed - No data to display  Imaging Review No results found.   EKG Interpretation None      MDM   Final diagnoses:  Acute idiopathic gout of multiple sites  HTN (hypertension), benign    55 y.o. male here with acute gout flare in R ankle and R wrist x1wk since consuming alcohol. States he doesn't usually take meds for gout, has documented elevated uric acid 30yrs ago so this is likely an accurate diagnosis. No trauma or  injury. No warmth/redness, doubt septic joint. Pt mildly tachycardic but this resolved during exam, likely pain related. Doubt need for imaging, no focal bony TTP, mostly just in R wrist diffusely throughout; some swelling noted in R wrist and R ankle. NVI with soft compartments. Chart review reveals only one lab draw in our system, from 10/2013, Cr was slightly elevated, so given this information will avoid anything that could be nephrotoxic since I don't know his current kidney function. Will use prednisone and gentle NSAIDs for gout flare. Norco rx for pain. Diet modifications discussed. Discussed RICE or warm compress for pain. F/up with PCP in 1wk for recheck and ongoing management. I explained the diagnosis and have given explicit precautions to return to the ER including for any other new or worsening symptoms. The patient understands and accepts the medical plan as it's been dictated and I have answered their questions. Discharge instructions concerning home  care and prescriptions have been given. The patient is STABLE and is discharged to home in good condition.   I personally performed the services described in this documentation, which was scribed in my presence. The recorded information has been reviewed and is accurate.  BP 151/118 mmHg  Pulse 111  Temp(Src) 98 F (36.7 C)  Resp 24  Ht 5\' 10"  (1.778 m)  Wt 108.863 kg  BMI 34.44 kg/m2  SpO2 97%  Meds ordered this encounter  Medications  . HYDROcodone-acetaminophen (NORCO/VICODIN) 5-325 MG per tablet 1 tablet    Sig:   . predniSONE (DELTASONE) tablet 60 mg    Sig:   . predniSONE (DELTASONE) 20 MG tablet    Sig: 3 tabs po daily x 4 days starting 10/20/15    Dispense:  12 tablet    Refill:  0    Order Specific Question:  Supervising Provider    Answer:  Eber HongMILLER, BRIAN [[id number]]  . HYDROcodone-acetaminophen (NORCO) 5-325 MG tablet    Sig: Take 1 tablet by mouth every 6 (six) hours as needed for severe pain.    Dispense:  8 tablet    Refill:  0    Order Specific Question:  Supervising Provider    Answer:  MILLER, BRIAN [[id number]]  . ibuprofen (ADVIL,MOTRIN) 400 MG tablet    Sig: Take 1 tablet (400 mg total) by mouth every 8 (eight) hours as needed for mild pain or moderate pain.    Dispense:  30 tablet    Refill:  0    Order Specific Question:  Supervising Provider    Answer:  Eber HongMILLER, BRIAN [[id number]]       Maxamillian Tienda Camprubi-Soms, PA-C 10/19/15 2157  Gwyneth SproutWhitney Plunkett, MD 10/21/15 1450

## 2015-10-19 NOTE — ED Notes (Signed)
Pt reports history of gout and states he feels he's having a "flare of his gout". Pt c/o of pain in right ankle and right hand.

## 2016-04-10 ENCOUNTER — Encounter (HOSPITAL_COMMUNITY): Payer: Self-pay

## 2016-04-10 ENCOUNTER — Ambulatory Visit (HOSPITAL_COMMUNITY)
Admission: EM | Admit: 2016-04-10 | Discharge: 2016-04-10 | Disposition: A | Discharge status: Other Acute Inpatient Hospital | Payer: 59

## 2016-04-10 ENCOUNTER — Emergency Department (HOSPITAL_COMMUNITY)
Admission: EM | Admit: 2016-04-10 | Discharge: 2016-04-10 | Disposition: A | Discharge status: Home / Self Care | Payer: 59 | Attending: Emergency Medicine | Admitting: Emergency Medicine

## 2016-04-10 DIAGNOSIS — Z79899 Other long term (current) drug therapy: Secondary | ICD-10-CM | POA: Insufficient documentation

## 2016-04-10 DIAGNOSIS — I10 Essential (primary) hypertension: Secondary | ICD-10-CM

## 2016-04-10 DIAGNOSIS — M25512 Pain in left shoulder: Secondary | ICD-10-CM

## 2016-04-10 DIAGNOSIS — M25511 Pain in right shoulder: Secondary | ICD-10-CM

## 2016-04-10 DIAGNOSIS — M62838 Other muscle spasm: Secondary | ICD-10-CM | POA: Insufficient documentation

## 2016-04-10 MED ORDER — DIAZEPAM 5 MG PO TABS
5.0000 mg | ORAL_TABLET | Freq: Once | ORAL | Status: AC
  Administered 2016-04-10: 5 mg via ORAL
  Filled 2016-04-10: qty 1

## 2016-04-10 MED ORDER — LISINOPRIL 20 MG PO TABS: 20.0000 mg | ORAL_TABLET | Freq: Once | ORAL | Status: DC

## 2016-04-10 MED ORDER — HYDROCODONE-ACETAMINOPHEN 5-325 MG PO TABS: 1.0000 | ORAL_TABLET | ORAL | 0 refills | Status: DC | PRN

## 2016-04-10 MED ORDER — LISINOPRIL 20 MG PO TABS
20.0000 mg | ORAL_TABLET | Freq: Once | ORAL | Status: AC
  Administered 2016-04-10: 20 mg via ORAL
  Filled 2016-04-10: qty 1

## 2016-04-10 MED ORDER — NAPROXEN 375 MG PO TABS: 375.0000 mg | ORAL_TABLET | Freq: Two times a day (BID) | ORAL | 0 refills | Status: AC | PRN

## 2016-04-10 MED ORDER — LISINOPRIL 20 MG PO TABS: 20.0000 mg | ORAL_TABLET | Freq: Every day | ORAL | 0 refills | Status: DC

## 2016-04-10 MED ORDER — CYCLOBENZAPRINE HCL 10 MG PO TABS: 10.0000 mg | ORAL_TABLET | Freq: Three times a day (TID) | ORAL | 0 refills | Status: DC | PRN

## 2016-04-10 MED ORDER — HYDROCODONE-ACETAMINOPHEN 5-325 MG PO TABS
1.0000 | ORAL_TABLET | Freq: Once | ORAL | Status: AC
  Administered 2016-04-10: 1 via ORAL
  Filled 2016-04-10: qty 1

## 2016-04-10 MED ORDER — KETOROLAC TROMETHAMINE 60 MG/2ML IM SOLN
60.0000 mg | Freq: Once | INTRAMUSCULAR | Status: AC
  Administered 2016-04-10: 60 mg via INTRAMUSCULAR
  Filled 2016-04-10: qty 2

## 2016-04-10 NOTE — ED Notes (Signed)
Dr. Penne LashIssacs and Shanda BumpsJessica - RN aware of pt's BP.

## 2016-04-10 NOTE — ED Provider Notes (Signed)
[facility]-EMERGENCY DEPT Provider Note   CSN: [id number] Arrival date & time: 04/10/16  1152     History   Chief Complaint Chief Complaint  Patient presents with  . Shoulder Pain  . Headache    HPI Tony Dean is a 55 y.o. male.  HPI 55 year old male with no significant past medical history beyond poorly controlled hypertension who presents with bilateral neck pain. The patient states that several days ago, he fell asleep on a couch. When he awoke, he had mild, aching, throbbing bilateral neck pain radiating up to the base of his skull. Over the last several days, the pain has persistently worsened. It seems to improve mildly when he is still at night but when he wakes up, he has extreme stiffness and pain. Describes the pain as an aching, stiffening, cramping sensation along the bilateral aspect of both of his neck with radiation to his back. Denies any associated chest pain or shortness of breath. No anterior neck pain. No vision changes. No recent falls. No upper or lower extremity numbness or weakness. No other medical complaints. Recent fevers or chills.  Past Medical History:  Diagnosis Date  . Dizziness   . Gout   . Hypertension     There are no active problems to display for this patient.   Past Surgical History:  Procedure Laterality Date  . APPENDECTOMY    . INGUINAL HERNIA REPAIR  1990's   right       Home Medications    Prior to Admission medications   Medication Sig Start Date End Date Taking? Authorizing Provider  cyclobenzaprine (FLEXERIL) 10 MG tablet Take 1 tablet (10 mg total) by mouth 3 (three) times daily as needed for muscle spasms. 04/10/16   Shaune Pollackameron Arnette Driggs, MD  HYDROcodone-acetaminophen (NORCO) 5-325 MG tablet Take 1 tablet by mouth every 6 (six) hours as needed for severe pain. 10/19/15   Mercedes Camprubi-Soms, PA-C  HYDROcodone-acetaminophen (NORCO/VICODIN) 5-325 MG tablet Take 1-2 tablets by mouth every 4 (four) hours as needed. 04/10/16    Shaune Pollackameron Zeek Rostron, MD  ibuprofen (ADVIL,MOTRIN) 400 MG tablet Take 1 tablet (400 mg total) by mouth every 8 (eight) hours as needed for mild pain or moderate pain. 10/19/15   Mercedes Camprubi-Soms, PA-C  lisinopril (PRINIVIL,ZESTRIL) 20 MG tablet Take 1 tablet (20 mg total) by mouth daily. 04/10/16   Shaune Pollackameron Katana Berthold, MD  Multiple Vitamins-Minerals (MENS 50+ MULTI VITAMIN/MIN PO) Take by mouth.    Historical Provider, MD  naproxen (NAPROSYN) 375 MG tablet Take 1 tablet (375 mg total) by mouth 2 (two) times daily as needed for moderate pain. 04/10/16 04/17/16  Shaune Pollackameron Maejor Erven, MD  predniSONE (DELTASONE) 20 MG tablet 3 tabs po daily x 4 days starting 10/20/15 10/19/15   Mercedes Camprubi-Soms, PA-C    Family History Family History  Problem Relation Age of Onset  . Colon cancer Neg Hx     Social History Social History  Substance Use Topics  . Smoking status: Never Smoker  . Smokeless tobacco: Never Used  . Alcohol use 13.2 - 14.4 oz/week    14 Cans of beer, 8 - 10 Shots of liquor per week     Allergies   Patient has no known allergies.   Review of Systems Review of Systems  Constitutional: Negative for chills, fatigue and fever.  HENT: Negative for congestion and rhinorrhea.   Eyes: Negative for visual disturbance.  Respiratory: Negative for cough, shortness of breath and wheezing.   Cardiovascular: Negative for chest pain and leg swelling.  Gastrointestinal: Negative for abdominal pain, diarrhea, nausea and vomiting.  Genitourinary: Negative for dysuria and flank pain.  Musculoskeletal: Positive for arthralgias, back pain, neck pain and neck stiffness.  Skin: Negative for rash and wound.  Allergic/Immunologic: Negative for immunocompromised state.  Neurological: Negative for syncope, weakness and headaches.  All other systems reviewed and are negative.    Physical Exam Updated Vital Signs BP (!) 183/136 (BP Location: Left Arm)   Pulse 113   Temp 97.4 F (36.3 C) (Oral)   Resp  18   Ht 5\' 10"  (1.778 m)   Wt 230 lb (104.3 kg)   SpO2 94%   BMI 33.00 kg/m   Physical Exam  Constitutional: He is oriented to person, place, and time. He appears well-developed and well-nourished. He appears distressed (Appears to be in pain).  HENT:  Head: Normocephalic and atraumatic.  Eyes: Conjunctivae are normal.  Neck: Neck supple.  Significant tenderness to palpation bilaterally over superior aspect of trapezius and insertion of sternocleidomastoid over the mastoid. There is no palpable bony deformity. There is no overlying erythema. No associated lymphadenopathy. Intermittent palpable spasms were appreciated. No carotid bruits. Phonation is normal.  Cardiovascular: Normal rate, regular rhythm and normal heart sounds.  Exam reveals no friction rub.   No murmur heard. Pulmonary/Chest: Effort normal and breath sounds normal. No respiratory distress. He has no wheezes. He has no rales.  Abdominal: He exhibits no distension.  Musculoskeletal: He exhibits no edema.  Neurological: He is alert and oriented to person, place, and time. He exhibits normal muscle tone.  Motor strength 5 out of 5 in bilateral upper and lower extremities. Reflexes 2+ and symmetric throughout. Normal sensation to light touch distally.  Skin: Skin is warm. Capillary refill takes less than 2 seconds.  Psychiatric: He has a normal mood and affect.  Nursing note and vitals reviewed.    ED Treatments / Results  Labs (all labs ordered are listed, but only abnormal results are displayed) Labs Reviewed - No data to display  EKG  EKG Interpretation None       Radiology No results found.  Procedures Procedures (including critical care time)  Medications Ordered in ED Medications  ketorolac (TORADOL) injection 60 mg (60 mg Intramuscular Given 04/10/16 1301)  diazepam (VALIUM) tablet 5 mg (5 mg Oral Given 04/10/16 1301)  HYDROcodone-acetaminophen (NORCO/VICODIN) 5-325 MG per tablet 1 tablet (1 tablet  Oral Given 04/10/16 1301)  lisinopril (PRINIVIL,ZESTRIL) tablet 20 mg (20 mg Oral Given 04/10/16 1348)     Initial Impression / Assessment and Plan / ED Course  I have reviewed the triage vital signs and the nursing notes.  Pertinent labs & imaging results that were available during my care of the patient were reviewed by me and considered in my medical decision making (see chart for details).  Clinical Course     56 year old male who presents with reproducible, bilateral neck pain after sleeping in a different position. On arrival, patient is notably tachycardic and hypertensive, likely due to pain and distress, although patient states this is his baseline and he is poorly adherent with his blood pressure medications. Per review of records, patient is consistently with heart rate 100 to 110s during all previous ED visits as well as markedly hypertensive in this range. He denies any vision changes, chest pain, shortness breath, or other evidence to suggest hypertensive urgency. Regarding his neck pain, the history and exam is highly consistent with musculoskeletal pain with trapezius spasm. He has no focal numbness, weakness, or  evidence to suggest cervical radiculopathy. He has no sore throat, normal phonation, no evidence to suggest RPA or anterior pharyngeal abnormality. No fever, well appearing and I do not suspect meningitis or encephalitis. He strongly denies any chest pain and I do not suspect is referred ACS. Will treat with muscle relaxants, NSAIDs, and brief course of analgesics. I have also refilled his blood pressure medications and encouraged him to follow up with PCP. I counseled him on the risks of continued nonadherence with this regimen and the risks of persistently elevated heart rate and blood pressure.  Final Clinical Impressions(s) / ED Diagnoses   Final diagnoses:  Acute pain of both shoulders  Trapezius muscle spasm  Essential hypertension    New Prescriptions Discharge  Medication List as of 04/10/2016  1:33 PM    START taking these medications   Details  cyclobenzaprine (FLEXERIL) 10 MG tablet Take 1 tablet (10 mg total) by mouth 3 (three) times daily as needed for muscle spasms., Starting Tue 04/10/2016, Print    !! HYDROcodone-acetaminophen (NORCO/VICODIN) 5-325 MG tablet Take 1-2 tablets by mouth every 4 (four) hours as needed., Starting Tue 04/10/2016, Print    naproxen (NAPROSYN) 375 MG tablet Take 1 tablet (375 mg total) by mouth 2 (two) times daily as needed for moderate pain., Starting Tue 04/10/2016, Until Tue 04/17/2016, Print     !! - Potential duplicate medications found. Please discuss with provider.       Shaune Pollackameron Andren Bethea, MD 04/10/16 [phone]

## 2016-04-10 NOTE — ED Triage Notes (Signed)
Per Pt, Pt is coming from home with complaints of left shoulder pain, neck pain, and headache that started three days ago. Pt reports no injury noted. Denies Hx of the same, rash, N/V/D, or cough. Reports some nasal congestion.

## 2017-07-01 ENCOUNTER — Encounter (INDEPENDENT_AMBULATORY_CARE_PROVIDER_SITE_OTHER): Payer: Self-pay | Admitting: Physician Assistant

## 2017-07-01 ENCOUNTER — Other Ambulatory Visit: Payer: Self-pay

## 2017-07-01 ENCOUNTER — Ambulatory Visit (INDEPENDENT_AMBULATORY_CARE_PROVIDER_SITE_OTHER): Payer: Self-pay | Admitting: Physician Assistant

## 2017-07-01 VITALS — BP 162/118 | HR 107 | Temp 97.9°F | Ht 70.47 in | Wt 232.8 lb

## 2017-07-01 DIAGNOSIS — R350 Frequency of micturition: Secondary | ICD-10-CM | POA: Diagnosis not present

## 2017-07-01 DIAGNOSIS — R7303 Prediabetes: Secondary | ICD-10-CM

## 2017-07-01 DIAGNOSIS — Z114 Encounter for screening for human immunodeficiency virus [HIV]: Secondary | ICD-10-CM

## 2017-07-01 DIAGNOSIS — I10 Essential (primary) hypertension: Secondary | ICD-10-CM

## 2017-07-01 DIAGNOSIS — Z131 Encounter for screening for diabetes mellitus: Secondary | ICD-10-CM | POA: Diagnosis not present

## 2017-07-01 DIAGNOSIS — Z1159 Encounter for screening for other viral diseases: Secondary | ICD-10-CM | POA: Diagnosis not present

## 2017-07-01 DIAGNOSIS — Z23 Encounter for immunization: Secondary | ICD-10-CM | POA: Diagnosis not present

## 2017-07-01 DIAGNOSIS — F1010 Alcohol abuse, uncomplicated: Secondary | ICD-10-CM

## 2017-07-01 LAB — POCT URINALYSIS DIPSTICK
Bilirubin, UA: NEGATIVE
Glucose, UA: NEGATIVE
KETONES UA: NEGATIVE
Leukocytes, UA: NEGATIVE
NITRITE UA: NEGATIVE
PROTEIN UA: 30
Spec Grav, UA: 1.025 (ref 1.010–1.025)
Urobilinogen, UA: 0.2 E.U./dL
pH, UA: 5 (ref 5.0–8.0)

## 2017-07-01 LAB — POCT GLYCOSYLATED HEMOGLOBIN (HGB A1C): Hemoglobin A1C: 6.4

## 2017-07-01 MED ORDER — LISINOPRIL 40 MG PO TABS: 40.0000 mg | ORAL_TABLET | Freq: Every day | ORAL | 1 refills | Status: DC

## 2017-07-01 MED ORDER — AMLODIPINE BESYLATE 10 MG PO TABS: 10.0000 mg | ORAL_TABLET | Freq: Every day | ORAL | 1 refills | Status: DC

## 2017-07-01 NOTE — Patient Instructions (Signed)

## 2017-07-01 NOTE — Progress Notes (Signed)
Subjective:  Patient ID: Tony Dean, male    DOB: 30-Dec-1960  Age: 57 y.o. MRN: [id number]  CC: Gout and HTN  HPI Tony Dean is a 57 y.o. male with a medical history of gout, alcohol abuse, and HTN presents as a new patient for management of gout and HTN. Says he has not had a gout flare since approximately "a couple of months to almost a year".  Gout usually affects talar region of the right ankle. Risk factors include drinking alcohol. Drinks 2-3 40 oz beers and some liquors almost on a daily basis. Says he likes to drink and does not think he wants to quit, "maybe I will slow down". Wants a pill for urinary frequency. Also has trouble with erections.      Has not taken anti-hypertensives for "some time". Endorses occasional headache, exertional dyspnea and PND "sometimes".  Does not endorse CP, palpitations, abdominal pain, f/c/n/v, rash, or GI sxs.        Outpatient Medications Prior to Visit  Medication Sig Dispense Refill  . cyclobenzaprine (FLEXERIL) 10 MG tablet Take 1 tablet (10 mg total) by mouth 3 (three) times daily as needed for muscle spasms. (Patient not taking: Reported on 07/01/2017) 30 tablet 0  . HYDROcodone-acetaminophen (NORCO) 5-325 MG tablet Take 1 tablet by mouth every 6 (six) hours as needed for severe pain. (Patient not taking: Reported on 07/01/2017) 8 tablet 0  . HYDROcodone-acetaminophen (NORCO/VICODIN) 5-325 MG tablet Take 1-2 tablets by mouth every 4 (four) hours as needed. (Patient not taking: Reported on 07/01/2017) 12 tablet 0  . ibuprofen (ADVIL,MOTRIN) 400 MG tablet Take 1 tablet (400 mg total) by mouth every 8 (eight) hours as needed for mild pain or moderate pain. (Patient not taking: Reported on 07/01/2017) 30 tablet 0  . lisinopril (PRINIVIL,ZESTRIL) 20 MG tablet Take 1 tablet (20 mg total) by mouth daily. (Patient not taking: Reported on 07/01/2017) 30 tablet 0  . Multiple Vitamins-Minerals (MENS 50+ MULTI VITAMIN/MIN PO) Take by mouth.    .  predniSONE (DELTASONE) 20 MG tablet 3 tabs po daily x 4 days starting 10/20/15 (Patient not taking: Reported on 07/01/2017) 12 tablet 0   No facility-administered medications prior to visit.      ROS Review of Systems  Constitutional: Negative for chills, fever and malaise/fatigue.  Eyes: Negative for blurred vision.  Respiratory: Negative for shortness of breath.   Cardiovascular: Negative for chest pain and palpitations.  Gastrointestinal: Negative for abdominal pain and nausea.  Genitourinary: Negative for dysuria and hematuria.  Musculoskeletal: Negative for joint pain and myalgias.  Skin: Negative for rash.  Neurological: Negative for tingling and headaches.  Psychiatric/Behavioral: Negative for depression. The patient is not nervous/anxious.     Objective:  BP (!) 174/127 (BP Location: Right Arm, Patient Position: Sitting, Cuff Size: Large)   Pulse (!) 107   Temp 97.9 F (36.6 C) (Oral)   Ht 5' 10.47" (1.79 m)   Wt 232 lb 12.8 oz (105.6 kg)   SpO2 93%   BMI 32.96 kg/m   BP/Weight 07/01/2017 04/10/2016 10/19/2015  Systolic BP 174 183 151  Diastolic BP 127 136 118  Wt. (Lbs) 232.8 230 240  BMI 32.96 33 34.44      Physical Exam  Constitutional: He is oriented to person, place, and time.  Well developed, well nourished, NAD, polite  HENT:  Head: Normocephalic and atraumatic.  Eyes: No scleral icterus.  Neck: Normal range of motion. Neck supple. No thyromegaly present.  Cardiovascular: Normal rate, regular  rhythm and normal heart sounds.  Pulmonary/Chest: Effort normal and breath sounds normal.  Abdominal: Soft. Bowel sounds are normal. There is no tenderness.  Musculoskeletal: He exhibits no edema.  Neurological: He is alert and oriented to person, place, and time.  Skin: Skin is warm and dry. No rash noted. No erythema. No pallor.  Psychiatric: He has a normal mood and affect. His behavior is normal. Thought content normal.  Vitals reviewed.    Assessment &  Plan:     1. Hypertension, unspecified type - CBC with Differential - Comprehensive metabolic panel - Lipid panel - TSH - Uric Acid - amLODipine (NORVASC) 10 MG tablet; Take 1 tablet (10 mg total) by mouth daily.  Dispense: 90 tablet; Refill: 1 - lisinopril (PRINIVIL,ZESTRIL) 40 MG tablet; Take 1 tablet (40 mg total) by mouth daily.  Dispense: 90 tablet; Refill: 1  2. Screening for diabetes mellitus - HgB A1c 6.4% - Will start on Metformin depending on renal clearance  3. Urinary frequency - Urinalysis Dipstick small blood  4. Alcohol abuse - Patient counseled on detriment of alcoholism on the body. - I have messaged Jasmine Lewis LCSW to contact patient in regards to alcoholism  5. Screening for HIV (human immunodeficiency virus) - HIV antibody  6. Encounter for hepatitis C screening test for low risk patient - Hepatitis c antibody (reflex)  7. Need for Tdap vaccination - Tdap vaccine greater than or equal to 7yo IM  8. Need for prophylactic vaccination and inoculation against influenza - Flu Vaccine QUAD 6+ mos PF IM (Fluarix Quad PF)  9. Prediabetes - 6.4% in clinic today  Meds ordered this encounter  Medications  . amLODipine (NORVASC) 10 MG tablet    Sig: Take 1 tablet (10 mg total) by mouth daily.    Dispense:  90 tablet    Refill:  1    Order Specific Question:   Supervising Provider    Answer:   Quentin Angst [id number]  . lisinopril (PRINIVIL,ZESTRIL) 40 MG tablet    Sig: Take 1 tablet (40 mg total) by mouth daily.    Dispense:  90 tablet    Refill:  1    Order Specific Question:   Supervising Provider    Answer:   Quentin Angst [[id number]]    Follow-up: 4 weeks   Loletta Specter PA

## 2017-07-02 ENCOUNTER — Telehealth (INDEPENDENT_AMBULATORY_CARE_PROVIDER_SITE_OTHER): Payer: Self-pay

## 2017-07-02 LAB — CBC WITH DIFFERENTIAL/PLATELET
BASOS: 0 %
Basophils Absolute: 0 10*3/uL (ref 0.0–0.2)
EOS (ABSOLUTE): 0.1 10*3/uL (ref 0.0–0.4)
EOS: 1 %
HEMATOCRIT: 39.4 % (ref 37.5–51.0)
HEMOGLOBIN: 13.3 g/dL (ref 13.0–17.7)
IMMATURE GRANS (ABS): 0 10*3/uL (ref 0.0–0.1)
IMMATURE GRANULOCYTES: 0 %
LYMPHS: 38 %
Lymphocytes Absolute: 1.8 10*3/uL (ref 0.7–3.1)
MCH: 30 pg (ref 26.6–33.0)
MCHC: 33.8 g/dL (ref 31.5–35.7)
MCV: 89 fL (ref 79–97)
MONOCYTES: 8 %
Monocytes Absolute: 0.4 10*3/uL (ref 0.1–0.9)
NEUTROS PCT: 53 %
Neutrophils Absolute: 2.5 10*3/uL (ref 1.4–7.0)
PLATELETS: 232 10*3/uL (ref 150–379)
RBC: 4.43 x10E6/uL (ref 4.14–5.80)
RDW: 13.9 % (ref 12.3–15.4)
WBC: 4.8 10*3/uL (ref 3.4–10.8)

## 2017-07-02 LAB — COMPREHENSIVE METABOLIC PANEL
A/G RATIO: 1.3 (ref 1.2–2.2)
ALBUMIN: 4.2 g/dL (ref 3.5–5.5)
ALT: 24 IU/L (ref 0–44)
AST: 23 IU/L (ref 0–40)
Alkaline Phosphatase: 102 IU/L (ref 39–117)
BUN/Creatinine Ratio: 11 (ref 9–20)
BUN: 18 mg/dL (ref 6–24)
Bilirubin Total: <0.2 mg/dL (ref 0.0–1.2)
CALCIUM: 8.6 mg/dL — AB (ref 8.7–10.2)
CO2: 22 mmol/L (ref 20–29)
Chloride: 105 mmol/L (ref 96–106)
Creatinine, Ser: 1.6 mg/dL — ABNORMAL HIGH (ref 0.76–1.27)
GFR, EST AFRICAN AMERICAN: 55 mL/min/{1.73_m2} — AB (ref >59)
GFR, EST NON AFRICAN AMERICAN: 47 mL/min/{1.73_m2} — AB (ref >59)
Globulin, Total: 3.3 g/dL (ref 1.5–4.5)
Glucose: 120 mg/dL — ABNORMAL HIGH (ref 65–99)
POTASSIUM: 4.2 mmol/L (ref 3.5–5.2)
Sodium: 143 mmol/L (ref 134–144)
TOTAL PROTEIN: 7.5 g/dL (ref 6.0–8.5)

## 2017-07-02 LAB — HIV ANTIBODY (ROUTINE TESTING W REFLEX): HIV Screen 4th Generation wRfx: NONREACTIVE

## 2017-07-02 LAB — LIPID PANEL
CHOL/HDL RATIO: 3 ratio (ref 0.0–5.0)
Cholesterol, Total: 210 mg/dL — ABNORMAL HIGH (ref 100–199)
HDL: 69 mg/dL (ref >39)
LDL Calculated: 108 mg/dL — ABNORMAL HIGH (ref 0–99)
Triglycerides: 163 mg/dL — ABNORMAL HIGH (ref 0–149)
VLDL Cholesterol Cal: 33 mg/dL (ref 5–40)

## 2017-07-02 LAB — TSH: TSH: 0.24 u[IU]/mL — AB (ref 0.450–4.500)

## 2017-07-02 LAB — HEPATITIS C ANTIBODY (REFLEX)

## 2017-07-02 LAB — URIC ACID: Uric Acid: 9.6 mg/dL — ABNORMAL HIGH (ref 3.7–8.6)

## 2017-07-02 LAB — HCV COMMENT:

## 2017-07-02 NOTE — Telephone Encounter (Signed)
Patient aware of all results and has scheduled appointment. Maryjean Mornempestt S Roberts, CMA

## 2017-07-02 NOTE — Telephone Encounter (Signed)
-----   Message from Loletta Specteroger David Gomez, PA-C sent at 07/02/2017  8:49 AM EDT ----- Irregular TSH, cholesterol elevated, uric acid elevated, and kidney function decreased. Pt should return within one week for explanation and treatment.

## 2017-07-03 ENCOUNTER — Telehealth: Payer: Self-pay | Admitting: Licensed Clinical Social Worker

## 2017-07-03 NOTE — Telephone Encounter (Signed)
[facility] attempted to contact pt to follow up on behavioral health needs. The number on file is not currently in service.

## 2017-07-04 NOTE — Telephone Encounter (Signed)
Noted. Contact info will be asked for on f/u appointment.

## 2017-07-10 ENCOUNTER — Ambulatory Visit (INDEPENDENT_AMBULATORY_CARE_PROVIDER_SITE_OTHER): Payer: BLUE CROSS/BLUE SHIELD | Admitting: Physician Assistant

## 2017-07-10 ENCOUNTER — Other Ambulatory Visit: Payer: Self-pay

## 2017-07-10 ENCOUNTER — Encounter (INDEPENDENT_AMBULATORY_CARE_PROVIDER_SITE_OTHER): Payer: Self-pay | Admitting: Physician Assistant

## 2017-07-10 VITALS — BP 159/98 | HR 86 | Temp 97.7°F | Wt 228.0 lb

## 2017-07-10 DIAGNOSIS — E790 Hyperuricemia without signs of inflammatory arthritis and tophaceous disease: Secondary | ICD-10-CM

## 2017-07-10 DIAGNOSIS — F1010 Alcohol abuse, uncomplicated: Secondary | ICD-10-CM

## 2017-07-10 DIAGNOSIS — I10 Essential (primary) hypertension: Secondary | ICD-10-CM | POA: Diagnosis not present

## 2017-07-10 DIAGNOSIS — R7989 Other specified abnormal findings of blood chemistry: Secondary | ICD-10-CM | POA: Diagnosis not present

## 2017-07-10 DIAGNOSIS — E7841 Elevated Lipoprotein(a): Secondary | ICD-10-CM | POA: Diagnosis not present

## 2017-07-10 MED ORDER — ACAMPROSATE CALCIUM 333 MG PO TBEC: 666.0000 mg | DELAYED_RELEASE_TABLET | Freq: Three times a day (TID) | ORAL | 0 refills | Status: AC

## 2017-07-10 MED ORDER — ATORVASTATIN CALCIUM 20 MG PO TABS: 20.0000 mg | ORAL_TABLET | Freq: Every day | ORAL | 3 refills | Status: DC

## 2017-07-10 MED ORDER — ALLOPURINOL 100 MG PO TABS: 100.0000 mg | ORAL_TABLET | Freq: Every day | ORAL | 6 refills | Status: DC

## 2017-07-10 NOTE — Progress Notes (Signed)
Subjective:  Patient ID: Tony Dean, male    DOB: [birth year]  Age: 57 y.o. MRN: [id number]  CC: results  HPI Tony Dean is a 57 y.o. male with a medical history of gout, alcohol abuse, and HTN presents to f/u on abnormal results. He had irregular TSH, LDL/trigs elevated, uric acid elevated, glucose elevated, and kidney function decreased. BP is also noted to be similar to the last visit nine days ago. Reports taking medications as directed. Does not consume salty foods and sleeps well. However, he reports heavy alcohol use at times. Drank one 40oz and one 25 oz beer last night. Does not endorse any symptoms or complaints.      Outpatient Medications Prior to Visit  Medication Sig Dispense Refill  . amLODipine (NORVASC) 10 MG tablet Take 1 tablet (10 mg total) by mouth daily. 90 tablet 1  . lisinopril (PRINIVIL,ZESTRIL) 40 MG tablet Take 1 tablet (40 mg total) by mouth daily. 90 tablet 1  . Multiple Vitamins-Minerals (MENS 50+ MULTI VITAMIN/MIN PO) Take by mouth.     No facility-administered medications prior to visit.      ROS Review of Systems  Constitutional: Negative for chills, fever and malaise/fatigue.  Eyes: Negative for blurred vision.  Respiratory: Negative for shortness of breath.   Cardiovascular: Negative for chest pain and palpitations.  Gastrointestinal: Negative for abdominal pain and nausea.  Genitourinary: Negative for dysuria and hematuria.  Musculoskeletal: Negative for joint pain and myalgias.  Skin: Negative for rash.  Neurological: Negative for tingling and headaches.  Psychiatric/Behavioral: Negative for depression. The patient is not nervous/anxious.     Objective:  BP (!) 161/104 (BP Location: Right Arm, Patient Position: Sitting, Cuff Size: Large)   Pulse 86   Temp 97.7 F (36.5 C) (Oral)   Wt 228 lb (103.4 kg)   SpO2 97%   BMI 32.28 kg/m   BP/Weight 07/10/2017 07/01/2017 04/10/2016  Systolic BP 161 162 183  Diastolic BP 104 118 136   Wt. (Lbs) 228 232.8 230  BMI 32.28 32.96 33      Physical Exam  Constitutional: He is oriented to person, place, and time.  Well developed, well nourished, NAD, polite  HENT:  Head: Normocephalic and atraumatic.  Eyes glazed over  Eyes: No scleral icterus.  Neck: Normal range of motion. Neck supple. No thyromegaly present.  Cardiovascular: Normal rate, regular rhythm and normal heart sounds.  Pulmonary/Chest: Effort normal and breath sounds normal.  Musculoskeletal: He exhibits no edema.  Neurological: He is alert and oriented to person, place, and time. No cranial nerve deficit. Coordination normal.  Skin: Skin is warm and dry. No rash noted. No erythema. No pallor.  Psychiatric: He has a normal mood and affect. His behavior is normal. Thought content normal.  Vitals reviewed.    Assessment & Plan:    1. Alcohol abuse - Problem with binge drinking. I have counseled patient on the detriments of alcohol use. I have also developed a strategy to gradually decrease the amount of alcohol he consumes. Pt in agreement and will implement advice given.  - Begin acamprosate (CAMPRAL) 333 MG tablet; Take 2 tablets (666 mg total) by mouth 3 (three) times daily with meals.  Dispense: 180 tablet; Refill: 0 - Drug Screen, Urine  2. Hypertension, unspecified type - Thyroid Panel With TSH  3. Abnormal TSH - Thyroid Panel With TSH  4. Elevated lipoprotein(a) - Begin atorvastatin (LIPITOR) 20 MG tablet; Take 1 tablet (20 mg total) by mouth daily.  Dispense: 90  tablet; Refill: 3  5. Hyperuricemia - Begin allopurinol (ZYLOPRIM) 100 MG tablet; Take 1 tablet (100 mg total) by mouth daily.  Dispense: 30 tablet; Refill: 6 - Creatinine clearance calculated at 75 ml/min with Cockcroft-Gault equation  6. Elevated serum creatinine  - Basic Metabolic Panel   Meds ordered this encounter  Medications  . acamprosate (CAMPRAL) 333 MG tablet    Sig: Take 2 tablets (666 mg total) by mouth 3 (three)  times daily with meals.    Dispense:  180 tablet    Refill:  0    Order Specific Question:   Supervising Provider    Answer:   Quentin AngstJEGEDE, OLUGBEMIGA E [id number]  . atorvastatin (LIPITOR) 20 MG tablet    Sig: Take 1 tablet (20 mg total) by mouth daily.    Dispense:  90 tablet    Refill:  3    Order Specific Question:   Supervising Provider    Answer:   Quentin AngstJEGEDE, OLUGBEMIGA E [id number]  . allopurinol (ZYLOPRIM) 100 MG tablet    Sig: Take 1 tablet (100 mg total) by mouth daily.    Dispense:  30 tablet    Refill:  6    Order Specific Question:   Supervising Provider    Answer:   Quentin AngstJEGEDE, OLUGBEMIGA E [id number]    Follow-up: Return in about 4 weeks (around 08/07/2017) for alcohol abuse.   Loletta Specteroger David Sherlie Boyum PA

## 2017-07-10 NOTE — Patient Instructions (Signed)

## 2017-07-11 ENCOUNTER — Telehealth (INDEPENDENT_AMBULATORY_CARE_PROVIDER_SITE_OTHER): Payer: Self-pay

## 2017-07-11 LAB — THYROID PANEL WITH TSH
FREE THYROXINE INDEX: 1.7 (ref 1.2–4.9)
T3 Uptake Ratio: 28 % (ref 24–39)
T4, Total: 5.9 ug/dL (ref 4.5–12.0)
TSH: 0.895 u[IU]/mL (ref 0.450–4.500)

## 2017-07-11 LAB — DRUG SCREEN, URINE
AMPHETAMINES, URINE: NEGATIVE ng/mL
Barbiturate screen, urine: NEGATIVE ng/mL
Benzodiazepine Quant, Ur: NEGATIVE ng/mL
CANNABINOID QUANT UR: NEGATIVE ng/mL
Cocaine (Metab.): NEGATIVE ng/mL
OPIATE QUANT UR: NEGATIVE ng/mL
PCP Quant, Ur: NEGATIVE ng/mL

## 2017-07-11 LAB — BASIC METABOLIC PANEL
BUN/Creatinine Ratio: 16 (ref 9–20)
BUN: 28 mg/dL — ABNORMAL HIGH (ref 6–24)
CALCIUM: 9.1 mg/dL (ref 8.7–10.2)
CHLORIDE: 102 mmol/L (ref 96–106)
CO2: 24 mmol/L (ref 20–29)
CREATININE: 1.79 mg/dL — AB (ref 0.76–1.27)
GFR calc non Af Amer: 41 mL/min/{1.73_m2} — ABNORMAL LOW (ref >59)
GFR, EST AFRICAN AMERICAN: 48 mL/min/{1.73_m2} — AB (ref >59)
GLUCOSE: 82 mg/dL (ref 65–99)
Potassium: 4.5 mmol/L (ref 3.5–5.2)
Sodium: 141 mmol/L (ref 134–144)

## 2017-07-11 NOTE — Telephone Encounter (Signed)
Patient is aware of normal thyroid and kidney filtration being more impaired then it was at last blood draw. Instructed patient to take anti-hypertensive medications as directed and to refrain from drinking alcohol. Maryjean Mornempestt S Roberts, CMA

## 2017-07-11 NOTE — Telephone Encounter (Signed)
-----   Message from Loletta Specteroger David Gomez, PA-C sent at 07/11/2017  8:22 AM EDT ----- Thyroid normal. Kidney filtration is a little more impaired since the last blood draw. Please advise him to take his anti-hypertensives and to stop drinking alcohol.

## 2017-07-12 ENCOUNTER — Telehealth (INDEPENDENT_AMBULATORY_CARE_PROVIDER_SITE_OTHER): Payer: Self-pay

## 2017-07-12 NOTE — Telephone Encounter (Signed)
Patient has a voicemail box that has not been set up yet. .Will attempt to call patient again at a later time. Maryjean Mornempestt S Roberts, CMA

## 2017-07-12 NOTE — Telephone Encounter (Signed)
-----   Message from Loletta Specteroger David Gomez, PA-C sent at 07/12/2017  9:02 AM EDT ----- Negative drug screen. We may consider another drug for alcohol abuse at his f/u appointment.

## 2017-07-29 ENCOUNTER — Ambulatory Visit (INDEPENDENT_AMBULATORY_CARE_PROVIDER_SITE_OTHER): Payer: 59 | Admitting: Physician Assistant

## 2017-08-20 ENCOUNTER — Other Ambulatory Visit: Payer: Self-pay

## 2017-08-20 ENCOUNTER — Encounter (INDEPENDENT_AMBULATORY_CARE_PROVIDER_SITE_OTHER): Payer: Self-pay | Admitting: Physician Assistant

## 2017-08-20 ENCOUNTER — Ambulatory Visit (INDEPENDENT_AMBULATORY_CARE_PROVIDER_SITE_OTHER): Payer: BLUE CROSS/BLUE SHIELD | Admitting: Physician Assistant

## 2017-08-20 VITALS — BP 159/103 | HR 83 | Temp 98.0°F | Ht 70.0 in | Wt 225.0 lb

## 2017-08-20 DIAGNOSIS — E790 Hyperuricemia without signs of inflammatory arthritis and tophaceous disease: Secondary | ICD-10-CM

## 2017-08-20 DIAGNOSIS — T887XXA Unspecified adverse effect of drug or medicament, initial encounter: Secondary | ICD-10-CM | POA: Diagnosis not present

## 2017-08-20 DIAGNOSIS — F1010 Alcohol abuse, uncomplicated: Secondary | ICD-10-CM

## 2017-08-20 DIAGNOSIS — I10 Essential (primary) hypertension: Secondary | ICD-10-CM

## 2017-08-20 MED ORDER — LOSARTAN POTASSIUM 100 MG PO TABS: 100.0000 mg | ORAL_TABLET | Freq: Every day | ORAL | 5 refills | Status: DC

## 2017-08-20 MED ORDER — CARVEDILOL 6.25 MG PO TABS: 6.2500 mg | ORAL_TABLET | Freq: Two times a day (BID) | ORAL | 5 refills | Status: DC

## 2017-08-20 MED ORDER — NALTREXONE HCL 50 MG PO TABS: 50.0000 mg | ORAL_TABLET | Freq: Every day | ORAL | 1 refills | Status: DC

## 2017-08-20 NOTE — Progress Notes (Signed)
Subjective:  Patient ID: Tony Dean, male    DOB: [birth year]  Age: 57 y.o. MRN: [id number]  CC: f/u HTN and alcohol abuse  HPI Perez Dirico a 57 y.o.malewith a medical history of gout, alcohol abuse, and HTN presents to f/u on HTN, hyperuricemia, and alcohol abuse. Last BP 159/98 mmHg. BP 159/103 mmHg today. Takes medication daily as directed. Feels Lisinopril is causing a cough. No other allergic/anaphylactic symptoms to report.     Patient states he is taking acamprosate TID as directed. Feels less urge to drink but admits to drinking occasionally during the week. "Maybe about a 25 oz beer once in a while". Does not endorse CP, palpitations, SOB, HA, abdominal pain, rash, f/c/n/v, or GI/GU sxs.     Outpatient Medications Prior to Visit  Medication Sig Dispense Refill  . allopurinol (ZYLOPRIM) 100 MG tablet Take 1 tablet (100 mg total) by mouth daily. 30 tablet 6  . amLODipine (NORVASC) 10 MG tablet Take 1 tablet (10 mg total) by mouth daily. 90 tablet 1  . atorvastatin (LIPITOR) 20 MG tablet Take 1 tablet (20 mg total) by mouth daily. 90 tablet 3  . lisinopril (PRINIVIL,ZESTRIL) 40 MG tablet Take 1 tablet (40 mg total) by mouth daily. 90 tablet 1  . Multiple Vitamins-Minerals (MENS 50+ MULTI VITAMIN/MIN PO) Take by mouth.     No facility-administered medications prior to visit.      ROS Review of Systems  Constitutional: Negative for chills, fever and malaise/fatigue.  Eyes: Negative for blurred vision.  Respiratory: Negative for shortness of breath.   Cardiovascular: Negative for chest pain and palpitations.  Gastrointestinal: Negative for abdominal pain and nausea.  Genitourinary: Negative for dysuria and hematuria.  Musculoskeletal: Negative for joint pain and myalgias.  Skin: Negative for rash.  Neurological: Negative for tingling and headaches.  Psychiatric/Behavioral: Negative for depression. The patient is not nervous/anxious.     Objective:  BP (!)  159/103 (BP Location: Right Arm, Patient Position: Sitting, Cuff Size: Large)   Pulse 83   Temp 98 F (36.7 C) (Oral)   Ht  (1.778 m)   Wt 225 lb (102.1 kg)   SpO2 95%   BMI 32.28 kg/m   BP/Weight 08/20/2017 07/10/2017 07/01/2017  Systolic BP 159 159 162  Diastolic BP 103 98 118  Wt. (Lbs) 225 228 232.8  BMI 32.28 32.28 32.96      Physical Exam  Constitutional: He is oriented to person, place, and time.  Well developed, well nourished, NAD, polite  HENT:  Head: Normocephalic and atraumatic.  Eyes: No scleral icterus.  Neck: Normal range of motion. Neck supple. No thyromegaly present.  Cardiovascular: Normal rate, regular rhythm and normal heart sounds.  Trace pitting edema of the lower extremity bilaterally  Pulmonary/Chest: Effort normal and breath sounds normal.  Abdominal: Soft. Bowel sounds are normal. There is no tenderness.  Musculoskeletal: He exhibits no edema.  Neurological: He is alert and oriented to person, place, and time.  Skin: Skin is warm and dry. No rash noted. No erythema. No pallor.  Psychiatric: He has a normal mood and affect. His behavior is normal. Thought content normal.  Vitals reviewed.    Assessment & Plan:    1. Hypertension, unspecified type - Stop Lisinopril due to cough - Continue Amlodipine 10 mg - Begin losartan (COZAAR) 100 MG tablet; Take 1 tablet (100 mg total) by mouth daily.  Dispense: 30 tablet; Refill: 5 - Begin carvedilol (COREG) 6.25 MG tablet; Take 1 tablet (6.25 mg  total) by mouth 2 (two) times daily with a meal.  Dispense: 60 tablet; Refill: 5  2. Side effect of medication - Stop Lisinopril  3. Hyperuricemia - Draw uric acid level and BMP in one month - Continue on allopurinol  4. Alcohol abuse - Begin naltrexone (DEPADE) 50 MG tablet; Take 1 tablet (50 mg total) by mouth daily.  Dispense: 30 tablet; Refill: 1 - Stop Acamprosate as this is only minimally effective    Meds ordered this encounter  Medications   . losartan (COZAAR) 100 MG tablet    Sig: Take 1 tablet (100 mg total) by mouth daily.    Dispense:  30 tablet    Refill:  5    Order Specific Question:   Supervising Provider    Answer:   Quentin Angst [id number]  . carvedilol (COREG) 6.25 MG tablet    Sig: Take 1 tablet (6.25 mg total) by mouth 2 (two) times daily with a meal.    Dispense:  60 tablet    Refill:  5    Order Specific Question:   Supervising Provider    Answer:   Quentin Angst [id number]  . naltrexone (DEPADE) 50 MG tablet    Sig: Take 1 tablet (50 mg total) by mouth daily.    Dispense:  30 tablet    Refill:  1    Order Specific Question:   Supervising Provider    Answer:   Quentin Angst [id number]    Follow-up: Return in about 1 month (around 09/17/2017) for alcohol abuse and HTN.   Loletta Specter PA

## 2017-08-20 NOTE — Patient Instructions (Signed)
Alcohol Abuse and Nutrition Alcohol abuse is any pattern of alcohol consumption that harms your health, relationships, or work. Alcohol abuse can affect how your body breaks down and absorbs nutrients from food by causing your liver to work abnormally. Additionally, many people who abuse alcohol do not eat enough carbohydrates, protein, fat, vitamins, and minerals. This can cause poor nutrition (malnutrition) and a lack of nutrients (nutrient deficiencies), which can lead to further complications. Nutrients that are commonly lacking (deficient) among people who abuse alcohol include:  Vitamins. ? Vitamin A. This is stored in your liver. It is important for your vision, metabolism, and ability to fight off infections (immunity). ? B vitamins. These include vitamins such as folate, thiamin, and niacin. These are important in new cell growth and maintenance. ? Vitamin C. This plays an important role in iron absorption, wound healing, and immunity. ? Vitamin D. This is produced by your liver, but you can also get vitamin D from food. Vitamin D is necessary for your body to absorb and use calcium.  Minerals. ? Calcium. This is important for your bones and your heart and blood vessel (cardiovascular) function. ? Iron. This is important for blood, muscle, and nervous system functioning. ? Magnesium. This plays an important role in muscle and nerve function, and it helps to control blood sugar and blood pressure. ? Zinc. This is important for the normal function of your nervous system and digestive system (gastrointestinal tract).  Nutrition is an essential component of therapy for alcohol abuse. Your health care provider or dietitian will work with you to design a plan that can help restore nutrients to your body and prevent potential complications. What is my plan? Your dietitian may develop a specific diet plan that is based on your condition and any other complications you may have. A diet plan will  commonly include:  A balanced diet. ? Grains: 6-8 oz per day. ? Vegetables: 2-3 cups per day. ? Fruits: 1-2 cups per day. ? Meat and other protein: 5-6 oz per day. ? Dairy: 2-3 cups per day.  Vitamin and mineral supplements.  What do I need to know about alcohol and nutrition?  Consume foods that are high in antioxidants, such as grapes, berries, nuts, green tea, and dark green and orange vegetables. This can help to counteract some of the stress that is placed on your liver by consuming alcohol.  Avoid food and drinks that are high in fat and sugar. Foods such as sugared soft drinks, salty snack foods, and candy contain empty calories. This means that they lack important nutrients such as protein, fiber, and vitamins.  Eat frequent meals and snacks. Try to eat 5-6 small meals each day.  Eat a variety of fresh fruits and vegetables each day. This will help you get plenty of water, fiber, and vitamins in your diet.  Drink plenty of water and other clear fluids. Try to drink at least 48-64 oz (1.5-2 L) of water per day.  If you are a vegetarian, eat a variety of protein-rich foods. Pair whole grains with plant-based proteins at meals and snacks to obtain the greatest nutrient benefit from your food. For example, eat rice with beans, put peanut butter on whole-grain toast, or eat oatmeal with sunflower seeds.  Soak beans and whole grains overnight before cooking. This can help your body to absorb the nutrients more easily.  Include foods fortified with vitamins and minerals in your diet. Commonly fortified foods include milk, orange juice, cereal, and bread.    If you are malnourished, your dietitian may recommend a high-protein, high-calorie diet. This may include: ? 2,000-3,000 calories (kilocalories) per day. ? 70-100 grams of protein per day.  Your health care provider may recommend a complete nutritional supplement beverage. This can help to restore calories, protein, and vitamins to  your body. Depending on your condition, you may be advised to consume this instead of or in addition to meals.  Limit your intake of caffeine. Replace drinks like coffee and black tea with decaffeinated coffee and herbal tea.  Eat a variety of foods that are high in omega fatty acids. These include fish, nuts and seeds, and soybeans. These foods may help your liver to recover and may also stabilize your mood.  Certain medicines may cause changes in your appetite, taste, and weight. Work with your health care provider and dietitian to make any adjustments to your medicines and diet plan.  Include other healthy lifestyle choices in your daily routine. ? Be physically active. ? Get enough sleep. ? Spend time doing activities that you enjoy.  If you are unable to take in enough food and calories by mouth, your health care provider may recommend a feeding tube. This is a tube that passes through your nose and throat, directly into your stomach. Nutritional supplement beverages can be given to you through the feeding tube to help you get the nutrients you need.  Take vitamin or mineral supplements as recommended by your health care provider. What foods can I eat? Grains Enriched pasta. Enriched rice. Fortified whole-grain bread. Fortified whole-grain cereal. Barley. Brown rice. Quinoa. Millet. Vegetables All fresh, frozen, and canned vegetables. Spinach. Kale. Artichoke. Carrots. Winter squash and pumpkin. Sweet potatoes. Broccoli. Cabbage. Cucumbers. Tomatoes. Sweet peppers. Green beans. Peas. Corn. Fruits All fresh and frozen fruits. Berries. Grapes. Mango. Papaya. Guava. Cherries. Apples. Bananas. Peaches. Plums. Pineapple. Watermelon. Cantaloupe. Oranges. Avocado. Meats and Other Protein Sources Beef liver. Lean beef. Pork. Fresh and canned chicken. Fresh fish. Oysters. Sardines. Canned tuna. Shrimp. Eggs with yolks. Nuts and seeds. Peanut butter. Beans and lentils. Soybeans.  Tofu. Dairy Whole, low-fat, and nonfat milk. Whole, low-fat, and nonfat yogurt. Cottage cheese. Sour cream. Hard and soft cheeses. Beverages Water. Herbal tea. Decaffeinated coffee. Decaffeinated green tea. 100% fruit juice. 100% vegetable juice. Instant breakfast shakes. Condiments Ketchup. Mayonnaise. Mustard. Salad dressing. Barbecue sauce. Sweets and Desserts Sugar-free ice cream. Sugar-free pudding. Sugar-free gelatin. Fats and Oils Butter. Vegetable oil, flaxseed oil, olive oil, and walnut oil. Other Complete nutrition shakes. Protein bars. Sugar-free gum. The items listed above may not be a complete list of recommended foods or beverages. Contact your dietitian for more options. What foods are not recommended? Grains Sugar-sweetened breakfast cereals. Flavored instant oatmeal. Fried breads. Vegetables Breaded or deep-fried vegetables. Fruits Dried fruit with added sugar. Candied fruit. Canned fruit in syrup. Meats and Other Protein Sources Breaded or deep-fried meats. Dairy Flavored milks. Fried cheese curds or fried cheese sticks. Beverages Alcohol. Sugar-sweetened soft drinks. Sugar-sweetened tea. Caffeinated coffee and tea. Condiments Sugar. Honey. Agave nectar. Molasses. Sweets and Desserts Chocolate. Cake. Cookies. Candy. Other Potato chips. Pretzels. Salted nuts. Candied nuts. The items listed above may not be a complete list of foods and beverages to avoid. Contact your dietitian for more information. This information is not intended to replace advice given to you by your health care provider. Make sure you discuss any questions you have with your health care provider. Document Released: 02/01/2005 Document Revised: 08/17/2015 Document Reviewed: 11/10/2013 [employer] Interactive Patient Education    2018 [employer]  

## 2017-09-25 ENCOUNTER — Ambulatory Visit (INDEPENDENT_AMBULATORY_CARE_PROVIDER_SITE_OTHER): Payer: BLUE CROSS/BLUE SHIELD | Admitting: Physician Assistant

## 2017-11-29 ENCOUNTER — Encounter: Payer: Self-pay | Admitting: Internal Medicine

## 2018-01-22 ENCOUNTER — Encounter: Payer: Self-pay | Admitting: Internal Medicine

## 2018-10-22 ENCOUNTER — Encounter (INDEPENDENT_AMBULATORY_CARE_PROVIDER_SITE_OTHER): Payer: Self-pay | Admitting: Primary Care

## 2018-10-22 ENCOUNTER — Other Ambulatory Visit: Payer: Self-pay

## 2018-10-22 ENCOUNTER — Ambulatory Visit (INDEPENDENT_AMBULATORY_CARE_PROVIDER_SITE_OTHER): Payer: Self-pay | Admitting: Primary Care

## 2018-10-22 VITALS — BP 154/107 | HR 104 | Temp 98.3°F | Wt 234.0 lb

## 2018-10-22 DIAGNOSIS — E7841 Elevated Lipoprotein(a): Secondary | ICD-10-CM

## 2018-10-22 DIAGNOSIS — E790 Hyperuricemia without signs of inflammatory arthritis and tophaceous disease: Secondary | ICD-10-CM

## 2018-10-22 DIAGNOSIS — E119 Type 2 diabetes mellitus without complications: Secondary | ICD-10-CM

## 2018-10-22 DIAGNOSIS — Z0000 Encounter for general adult medical examination without abnormal findings: Secondary | ICD-10-CM

## 2018-10-22 DIAGNOSIS — Z1211 Encounter for screening for malignant neoplasm of colon: Secondary | ICD-10-CM

## 2018-10-22 DIAGNOSIS — Z6833 Body mass index (BMI) 33.0-33.9, adult: Secondary | ICD-10-CM

## 2018-10-22 DIAGNOSIS — I10 Essential (primary) hypertension: Secondary | ICD-10-CM

## 2018-10-22 DIAGNOSIS — H61893 Other specified disorders of external ear, bilateral: Secondary | ICD-10-CM

## 2018-10-22 LAB — POCT GLYCOSYLATED HEMOGLOBIN (HGB A1C): Hemoglobin A1C: 6.8 % — AB (ref 4.0–5.6)

## 2018-10-22 LAB — GLUCOSE, POCT (MANUAL RESULT ENTRY): POC Glucose: 168 mg/dl — AB (ref 70–99)

## 2018-10-22 MED ORDER — ALLOPURINOL 100 MG PO TABS: 100.0000 mg | ORAL_TABLET | Freq: Every day | ORAL | 3 refills | Status: DC

## 2018-10-22 MED ORDER — AMLODIPINE BESYLATE 10 MG PO TABS: 10.0000 mg | ORAL_TABLET | Freq: Every day | ORAL | 0 refills | Status: DC

## 2018-10-22 MED ORDER — CLONIDINE HCL 0.1 MG PO TABS
0.2000 mg | ORAL_TABLET | Freq: Once | ORAL | Status: AC
  Administered 2018-10-22: 0.2 mg via ORAL

## 2018-10-22 MED ORDER — LOSARTAN POTASSIUM 100 MG PO TABS: 100.0000 mg | ORAL_TABLET | Freq: Every day | ORAL | 0 refills | Status: DC

## 2018-10-22 MED ORDER — CARVEDILOL 6.25 MG PO TABS: 6.2500 mg | ORAL_TABLET | Freq: Two times a day (BID) | ORAL | 3 refills | Status: DC

## 2018-10-22 MED ORDER — ATORVASTATIN CALCIUM 20 MG PO TABS: 20.0000 mg | ORAL_TABLET | Freq: Every day | ORAL | 0 refills | Status: DC

## 2018-10-22 MED FILL — LOSARTAN POTASSIUM 100 MG T: 100 | 30 days supply | Qty: 30 | Fill #0

## 2018-10-22 MED FILL — ATORVASTATIN 20 MG TABLET: 20 | 30 days supply | Qty: 30 | Fill #0

## 2018-10-22 MED FILL — ALLOPURINOL 100 MG TABLET: 100 | 30 days supply | Qty: 30 | Fill #0

## 2018-10-22 MED FILL — AMLODIPINE BESYLATE 10 MG T: 10 | 30 days supply | Qty: 30 | Fill #0

## 2018-10-22 MED FILL — CARVEDILOL 6.25 MG TABLET: 6.25 | 30 days supply | Qty: 60 | Fill #0

## 2018-10-22 NOTE — Patient Instructions (Signed)
Managing Your Hypertension Hypertension is commonly called high blood pressure. This is when the force of your blood pressing against the walls of your arteries is too strong. Arteries are blood vessels that carry blood from your heart throughout your body. Hypertension forces the heart to work harder to pump blood, and may cause the arteries to become narrow or stiff. Having untreated or uncontrolled hypertension can cause heart attack, stroke, kidney disease, and other problems. What are blood pressure readings? A blood pressure reading consists of a higher number over a lower number. Ideally, your blood pressure should be below 120/80. The first ("top") number is called the systolic pressure. It is a measure of the pressure in your arteries as your heart beats. The second ("bottom") number is called the diastolic pressure. It is a measure of the pressure in your arteries as the heart relaxes. What does my blood pressure reading mean? Blood pressure is classified into four stages. Based on your blood pressure reading, your health care provider may use the following stages to determine what type of treatment you need, if any. Systolic pressure and diastolic pressure are measured in a unit called mm Hg. Normal  Systolic pressure: below 120.  Diastolic pressure: below 80. Elevated  Systolic pressure: 120-129.  Diastolic pressure: below 80. Hypertension stage 1  Systolic pressure: 130-139.  Diastolic pressure: 80-89. Hypertension stage 2  Systolic pressure: 140 or above.  Diastolic pressure: 90 or above. What health risks are associated with hypertension? Managing your hypertension is an important responsibility. Uncontrolled hypertension can lead to:  A heart attack.  A stroke.  A weakened blood vessel (aneurysm).  Heart failure.  Kidney damage.  Eye damage.  Metabolic syndrome.  Memory and concentration problems. What changes can I make to manage my  hypertension? Hypertension can be managed by making lifestyle changes and possibly by taking medicines. Your health care provider will help you make a plan to bring your blood pressure within a normal range. Eating and drinking   Eat a diet that is high in fiber and potassium, and low in salt (sodium), added sugar, and fat. An example eating plan is called the DASH (Dietary Approaches to Stop Hypertension) diet. To eat this way: ? Eat plenty of fresh fruits and vegetables. Try to fill half of your plate at each meal with fruits and vegetables. ? Eat whole grains, such as whole wheat pasta, brown rice, or whole grain bread. Fill about one quarter of your plate with whole grains. ? Eat low-fat diary products. ? Avoid fatty cuts of meat, processed or cured meats, and poultry with skin. Fill about one quarter of your plate with lean proteins such as fish, chicken without skin, beans, eggs, and tofu. ? Avoid premade and processed foods. These tend to be higher in sodium, added sugar, and fat.  Reduce your daily sodium intake. Most people with hypertension should eat less than 1,500 mg of sodium a day.  Limit alcohol intake to no more than 1 drink a day for nonpregnant women and 2 drinks a day for men. One drink equals 12 oz of beer, 5 oz of wine, or 1 oz of hard liquor. Lifestyle  Work with your health care provider to maintain a healthy body weight, or to lose weight. Ask what an ideal weight is for you.  Get at least 30 minutes of exercise that causes your heart to beat faster (aerobic exercise) most days of the week. Activities may include walking, swimming, or biking.  Include exercise   to strengthen your muscles (resistance exercise), such as weight lifting, as part of your weekly exercise routine. Try to do these types of exercises for 30 minutes at least 3 days a week.  Do not use any products that contain nicotine or tobacco, such as cigarettes and e-cigarettes. If you need help quitting,  ask your health care provider.  Control any long-term (chronic) conditions you have, such as high cholesterol or diabetes. Monitoring  Monitor your blood pressure at home as told by your health care provider. Your personal target blood pressure may vary depending on your medical conditions, your age, and other factors.  Have your blood pressure checked regularly, as often as told by your health care provider. Working with your health care provider  Review all the medicines you take with your health care provider because there may be side effects or interactions.  Talk with your health care provider about your diet, exercise habits, and other lifestyle factors that may be contributing to hypertension.  Visit your health care provider regularly. Your health care provider can help you create and adjust your plan for managing hypertension. Will I need medicine to control my blood pressure? Your health care provider may prescribe medicine if lifestyle changes are not enough to get your blood pressure under control, and if:  Your systolic blood pressure is 130 or higher.  Your diastolic blood pressure is 80 or higher. Take medicines only as told by your health care provider. Follow the directions carefully. Blood pressure medicines must be taken as prescribed. The medicine does not work as well when you skip doses. Skipping doses also puts you at risk for problems. Contact a health care provider if:  You think you are having a reaction to medicines you have taken.  You have repeated (recurrent) headaches.  You feel dizzy.  You have swelling in your ankles.  You have trouble with your vision. Get help right away if:  You develop a severe headache or confusion.  You have unusual weakness or numbness, or you feel faint.  You have severe pain in your chest or abdomen.  You vomit repeatedly.  You have trouble breathing. Summary  Hypertension is when the force of blood pumping  through your arteries is too strong. If this condition is not controlled, it may put you at risk for serious complications.  Your personal target blood pressure may vary depending on your medical conditions, your age, and other factors. For most people, a normal blood pressure is less than 120/80.  Hypertension is managed by lifestyle changes, medicines, or both. Lifestyle changes include weight loss, eating a healthy, low-sodium diet, exercising more, and limiting alcohol. This information is not intended to replace advice given to you by your health care provider. Make sure you discuss any questions you have with your health care provider. Document Released: 01/02/2012 Document Revised: 08/01/2018 Document Reviewed: 03/07/2016 Elsevier Patient Education  2020 [employer]  

## 2018-10-22 NOTE — Progress Notes (Signed)
Established Patient Office Visit  Subjective:  Patient ID: Tony Dean, male    DOB: [birth year]  Age: 58 y.o. MRN: [id number]  CC:  Chief Complaint  Patient presents with  . Annual Exam  . Hypertension    HPI Tony Dean presents for management chronic disease last visit 08/20/2017. Presented today with a elevated Bp elevated 213/154 recheck 177/134 he has not had blood pressure medication 1 month ago. Denies shortness of breath, headaches, chest pain or lower extremity edema. PMI: listed below   Past Medical History:  Diagnosis Date  . Dizziness   . Gout   . Hypertension     Past Surgical History:  Procedure Laterality Date  . APPENDECTOMY    . INGUINAL HERNIA REPAIR  1990's   right    Family History  Problem Relation Age of Onset  . Colon cancer Neg Hx     Social History   Socioeconomic History  . Marital status: Legally Separated    Spouse name: Not on file  . Number of children: Not on file  . Years of education: Not on file  . Highest education level: Not on file  Occupational History  . Not on file  Social Needs  . Financial resource strain: Not on file  . Food insecurity    Worry: Not on file    Inability: Not on file  . Transportation needs    Medical: Not on file    Non-medical: Not on file  Tobacco Use  . Smoking status: Never Smoker  . Smokeless tobacco: Never Used  Substance and Sexual Activity  . Alcohol use: Yes    Alcohol/week: 22.0 - 24.0 standard drinks    Types: 14 Cans of beer, 8 - 10 Shots of liquor per week  . Drug use: No  . Sexual activity: Not on file  Lifestyle  . Physical activity    Days per week: Not on file    Minutes per session: Not on file  . Stress: Not on file  Relationships  . Social Herbalist on phone: Not on file    Gets together: Not on file    Attends religious service: Not on file    Active member of club or organization: Not on file    Attends meetings of clubs or organizations: Not  on file    Relationship status: Not on file  . Intimate partner violence    Fear of current or ex partner: Not on file    Emotionally abused: Not on file    Physically abused: Not on file    Forced sexual activity: Not on file  Other Topics Concern  . Not on file  Social History Narrative  . Not on file    Outpatient Medications Prior to Visit  Medication Sig Dispense Refill  . Multiple Vitamins-Minerals (MENS 50+ MULTI VITAMIN/MIN PO) Take by mouth.    . naltrexone (DEPADE) 50 MG tablet Take 1 tablet (50 mg total) by mouth daily. (Patient not taking: Reported on 10/22/2018) 30 tablet 1  . allopurinol (ZYLOPRIM) 100 MG tablet Take 1 tablet (100 mg total) by mouth daily. (Patient not taking: Reported on 10/22/2018) 30 tablet 6  . amLODipine (NORVASC) 10 MG tablet Take 1 tablet (10 mg total) by mouth daily. (Patient not taking: Reported on 10/22/2018) 90 tablet 1  . atorvastatin (LIPITOR) 20 MG tablet Take 1 tablet (20 mg total) by mouth daily. (Patient not taking: Reported on 10/22/2018) 90 tablet 3  . carvedilol (  COREG) 6.25 MG tablet Take 1 tablet (6.25 mg total) by mouth 2 (two) times daily with a meal. (Patient not taking: Reported on 10/22/2018) 60 tablet 5  . losartan (COZAAR) 100 MG tablet Take 1 tablet (100 mg total) by mouth daily. (Patient not taking: Reported on 10/22/2018) 30 tablet 5   No facility-administered medications prior to visit.     Allergies  Allergen Reactions  . Lisinopril Cough    ROS Review of Systems  Respiratory: Positive for shortness of breath.   Gastrointestinal: Positive for constipation, nausea and vomiting.  Musculoskeletal:       Bilateral leg pain  Neurological: Positive for headaches.  All other systems reviewed and are negative.     Objective:    Physical Exam  Constitutional: He is oriented to person, place, and time. He appears well-developed.  HENT:  Head: Normocephalic and atraumatic.  Bilateral cerumen impaction unable to visualize TM   Eyes: Pupils are equal, round, and reactive to light. EOM are normal.  Neck: Normal range of motion. Neck supple.  Cardiovascular: Normal rate and regular rhythm.  Pulmonary/Chest: Effort normal and breath sounds normal.  Abdominal: Soft. Bowel sounds are normal. He exhibits distension.  Musculoskeletal: Normal range of motion.  Neurological: He is oriented to person, place, and time.  Skin: Skin is warm and dry.  Psychiatric: He has a normal mood and affect.  Sensory exam of the foot is normal, tested with the monofilament. Good pulses, no lesions or ulcers, good peripheral pulses.  BP (!) 177/134 (BP Location: Left Arm, Patient Position: Sitting, Cuff Size: Normal)   Pulse (!) 107   Temp 98.3 F (36.8 C) (Oral)   Wt 234 lb (106.1 kg)   SpO2 95%   BMI 33.58 kg/m  Wt Readings from Last 3 Encounters:  10/22/18 234 lb (106.1 kg)  08/20/17 225 lb (102.1 kg)  07/10/17 228 lb (103.4 kg)     Health Maintenance Due  Topic Date Due  . COLONOSCOPY  02/08/2018    There are no preventive care reminders to display for this patient.  Lab Results  Component Value Date   TSH 0.895 07/10/2017   Lab Results  Component Value Date   WBC 4.8 07/01/2017   HGB 13.3 07/01/2017   HCT 39.4 07/01/2017   MCV 89 07/01/2017   PLT 232 07/01/2017   Lab Results  Component Value Date   NA 141 07/10/2017   K 4.5 07/10/2017   CO2 24 07/10/2017   GLUCOSE 82 07/10/2017   BUN 28 (H) 07/10/2017   CREATININE 1.79 (H) 07/10/2017   BILITOT <0.2 07/01/2017   ALKPHOS 102 07/01/2017   AST 23 07/01/2017   ALT 24 07/01/2017   PROT 7.5 07/01/2017   ALBUMIN 4.2 07/01/2017   CALCIUM 9.1 07/10/2017   ANIONGAP 15 11/09/2013   Lab Results  Component Value Date   CHOL 210 (H) 07/01/2017   Lab Results  Component Value Date   HDL 69 07/01/2017   Lab Results  Component Value Date   LDLCALC 108 (H) 07/01/2017   Lab Results  Component Value Date   TRIG 163 (H) 07/01/2017   Lab Results   Component Value Date   CHOLHDL 3.0 07/01/2017   Lab Results  Component Value Date   HGBA1C 6.8 (A) 10/22/2018      Assessment & Plan:   Problem List Items Addressed This Visit    None    Visit Diagnoses    Type 2 diabetes mellitus without complication, without long-term  current use of insulin (HCC)    -  Primary   Relevant Medications   losartan (COZAAR) 100 MG tablet   atorvastatin (LIPITOR) 20 MG tablet   Other Relevant Orders   Glucose (CBG) (Completed)   HgB A1c (Completed)   Microalbumin, urine   CBC with Differential   CMP14+EGFR   Hemoglobin A1c   Special screening for malignant neoplasms, colon       Relevant Orders   Fecal occult blood, imunochemical   Annual physical exam       Relevant Orders   CBC with Differential   CMP14+EGFR   Hemoglobin A1c   Lipid Panel   Hypertension, unspecified type       Relevant Medications   cloNIDine (CATAPRES) tablet 0.2 mg (Completed)   amLODipine (NORVASC) 10 MG tablet   losartan (COZAAR) 100 MG tablet   carvedilol (COREG) 6.25 MG tablet   atorvastatin (LIPITOR) 20 MG tablet   Other Relevant Orders   CMP14+EGFR   Elevated lipoprotein(a)       Relevant Medications   atorvastatin (LIPITOR) 20 MG tablet   Other Relevant Orders   Lipid Panel   Hyperuricemia       Relevant Medications   allopurinol (ZYLOPRIM) 100 MG tablet   Other Relevant Orders   CMP14+EGFR   Colon cancer screening       Relevant Orders   Fecal occult blood, imunochemical   BMI 33.0-33.9,adult       Relevant Orders   CBC with Differential   CMP14+EGFR   Irritation of external ear canal, bilateral       Relevant Orders   Lipid Panel   Ear Lavage      Meds ordered this encounter  Medications  . cloNIDine (CATAPRES) tablet 0.2 mg  . amLODipine (NORVASC) 10 MG tablet    Sig: Take 1 tablet (10 mg total) by mouth daily.    Dispense:  90 tablet    Refill:  0  . losartan (COZAAR) 100 MG tablet    Sig: Take 1 tablet (100 mg total) by mouth  daily.    Dispense:  90 tablet    Refill:  0  . carvedilol (COREG) 6.25 MG tablet    Sig: Take 1 tablet (6.25 mg total) by mouth 2 (two) times daily with a meal.    Dispense:  60 tablet    Refill:  3  . atorvastatin (LIPITOR) 20 MG tablet    Sig: Take 1 tablet (20 mg total) by mouth daily.    Dispense:  90 tablet    Refill:  0  . allopurinol (ZYLOPRIM) 100 MG tablet    Sig: Take 1 tablet (100 mg total) by mouth daily.    Dispense:  30 tablet    Refill:  3   Tony Dean was seen today for annual exam and hypertension.  Diagnoses and all orders for this visit:  Type 2 diabetes mellitus without complication, without long-term current use of insulin (HCC) -     Glucose (CBG) -     HgB A1c -     Cancel: Fecal occult blood, imunochemical([facility]); Future -     Microalbumin, urine -     CBC with Differential -     CMP14+EGFR -     Hemoglobin A1c  Special screening for malignant neoplasms, colon -     Cancel: Fecal occult blood, imunochemical([facility]); Future -     Fecal occult blood, imunochemical; Future  Annual physical exam -     CBC with  Differential -     CMP14+EGFR -     Hemoglobin A1c -     Lipid Panel  Hypertension, unspecified type -     cloNIDine (CATAPRES) tablet 0.2 mg -     amLODipine (NORVASC) 10 MG tablet; Take 1 tablet (10 mg total) by mouth daily. -     losartan (COZAAR) 100 MG tablet; Take 1 tablet (100 mg total) by mouth daily. -     carvedilol (COREG) 6.25 MG tablet; Take 1 tablet (6.25 mg total) by mouth 2 (two) times daily with a meal. -     CMP14+EGFR  Elevated lipoprotein(a) -     atorvastatin (LIPITOR) 20 MG tablet; Take 1 tablet (20 mg total) by mouth daily. -     Lipid Panel  Hyperuricemia -     allopurinol (ZYLOPRIM) 100 MG tablet; Take 1 tablet (100 mg total) by mouth daily. -     CMP14+EGFR  Colon cancer screening -     Fecal occult blood, imunochemical; Future  BMI 33.0-33.9,adult -     CBC with Differential -      CMP14+EGFR  Irritation of external ear canal, bilateral -     Lipid Panel -     Ear Lavage   Follow-up: Return in about 4 weeks (around 11/19/2018) for Blood pressure check refilled medication re-eval .    Kerin Perna, NP

## 2018-10-23 LAB — CMP14+EGFR
ALT: 19 IU/L (ref 0–44)
AST: 20 IU/L (ref 0–40)
Albumin/Globulin Ratio: 1.3 (ref 1.2–2.2)
Albumin: 4.4 g/dL (ref 3.8–4.9)
Alkaline Phosphatase: 77 IU/L (ref 39–117)
BUN/Creatinine Ratio: 12 (ref 9–20)
BUN: 26 mg/dL — ABNORMAL HIGH (ref 6–24)
Bilirubin Total: 1 mg/dL (ref 0.0–1.2)
CO2: 24 mmol/L (ref 20–29)
Calcium: 10 mg/dL (ref 8.7–10.2)
Chloride: 103 mmol/L (ref 96–106)
Creatinine, Ser: 2.12 mg/dL — ABNORMAL HIGH (ref 0.76–1.27)
GFR calc Af Amer: 39 mL/min/{1.73_m2} — ABNORMAL LOW (ref >59)
GFR calc non Af Amer: 34 mL/min/{1.73_m2} — ABNORMAL LOW (ref >59)
Globulin, Total: 3.5 g/dL (ref 1.5–4.5)
Glucose: 178 mg/dL — ABNORMAL HIGH (ref 65–99)
Potassium: 4.2 mmol/L (ref 3.5–5.2)
Sodium: 145 mmol/L — ABNORMAL HIGH (ref 134–144)
Total Protein: 7.9 g/dL (ref 6.0–8.5)

## 2018-10-23 LAB — CBC WITH DIFFERENTIAL/PLATELET
Basophils Absolute: 0.1 10*3/uL (ref 0.0–0.2)
Basos: 1 %
EOS (ABSOLUTE): 0.2 10*3/uL (ref 0.0–0.4)
Eos: 2 %
Hematocrit: 43.7 % (ref 37.5–51.0)
Hemoglobin: 14.6 g/dL (ref 13.0–17.7)
Immature Grans (Abs): 0.1 10*3/uL (ref 0.0–0.1)
Immature Granulocytes: 1 %
Lymphocytes Absolute: 1.9 10*3/uL (ref 0.7–3.1)
Lymphs: 20 %
MCH: 31.7 pg (ref 26.6–33.0)
MCHC: 33.4 g/dL (ref 31.5–35.7)
MCV: 95 fL (ref 79–97)
Monocytes Absolute: 0.8 10*3/uL (ref 0.1–0.9)
Monocytes: 9 %
Neutrophils Absolute: 6.2 10*3/uL (ref 1.4–7.0)
Neutrophils: 67 %
Platelets: 217 10*3/uL (ref 150–450)
RBC: 4.61 x10E6/uL (ref 4.14–5.80)
RDW: 13.2 % (ref 11.6–15.4)
WBC: 9.1 10*3/uL (ref 3.4–10.8)

## 2018-10-23 LAB — LIPID PANEL
Chol/HDL Ratio: 4 ratio (ref 0.0–5.0)
Cholesterol, Total: 240 mg/dL — ABNORMAL HIGH (ref 100–199)
HDL: 60 mg/dL (ref >39)
LDL Calculated: 149 mg/dL — ABNORMAL HIGH (ref 0–99)
Triglycerides: 153 mg/dL — ABNORMAL HIGH (ref 0–149)
VLDL Cholesterol Cal: 31 mg/dL (ref 5–40)

## 2018-10-23 LAB — HEMOGLOBIN A1C
Est. average glucose Bld gHb Est-mCnc: 148 mg/dL
Hgb A1c MFr Bld: 6.8 % — ABNORMAL HIGH (ref 4.8–5.6)

## 2018-10-23 LAB — MICROALBUMIN, URINE: Microalbumin, Urine: 482.9 ug/mL

## 2018-10-24 LAB — FECAL OCCULT BLOOD, IMMUNOCHEMICAL: Fecal Occult Bld: NEGATIVE

## 2018-10-26 ENCOUNTER — Other Ambulatory Visit (INDEPENDENT_AMBULATORY_CARE_PROVIDER_SITE_OTHER): Payer: Self-pay | Admitting: Primary Care

## 2018-10-26 DIAGNOSIS — N182 Chronic kidney disease, stage 2 (mild): Secondary | ICD-10-CM

## 2018-10-26 MED ORDER — METFORMIN HCL 1000 MG PO TABS: 1000.0000 mg | ORAL_TABLET | Freq: Two times a day (BID) | ORAL | 0 refills | Status: DC

## 2018-10-27 MED FILL — metFORMIN HCL 1000 MG TABS: 1000 | 30 days supply | Qty: 60 | Fill #0

## 2018-10-31 ENCOUNTER — Telehealth (INDEPENDENT_AMBULATORY_CARE_PROVIDER_SITE_OTHER): Payer: Self-pay

## 2018-10-31 NOTE — Telephone Encounter (Signed)
Patient is aware of results per PCP. To ensure understanding patient asked to have results explained to his mother. Patients mother is aware of results as well. They both expressed understanding. Nat Christen, CMA

## 2018-10-31 NOTE — Telephone Encounter (Signed)
-----   Message from Kerin Perna, NP sent at 10/26/2018 12:47 AM EDT ----- Chronic kidney failure will need to be referred to nephrologist as patient have day applied for [facility] assistance.  A1c 6.8 new diagnosis of diabetes started at thousand milligrams of metformin advised patient to take one half after breakfast and after dinner if tolerated without symptoms of diarrhea the second week take 1000 mg after breakfast and dinner.  Cholesterol also elevated recently refilled his cholesterol medication advised to decrease fatty foods in diet examples are liver organ meat fried foods avocados nuts eggs and cheese.  Take cholesterol medication every night as directed

## 2018-11-19 ENCOUNTER — Other Ambulatory Visit: Payer: Self-pay

## 2018-11-19 ENCOUNTER — Ambulatory Visit (INDEPENDENT_AMBULATORY_CARE_PROVIDER_SITE_OTHER): Payer: Self-pay | Admitting: Primary Care

## 2018-11-19 ENCOUNTER — Encounter (INDEPENDENT_AMBULATORY_CARE_PROVIDER_SITE_OTHER): Payer: Self-pay | Admitting: Primary Care

## 2018-11-19 VITALS — BP 100/72 | HR 64 | Temp 97.3°F | Ht 70.0 in | Wt 222.6 lb

## 2018-11-19 DIAGNOSIS — F1020 Alcohol dependence, uncomplicated: Secondary | ICD-10-CM

## 2018-11-19 DIAGNOSIS — N521 Erectile dysfunction due to diseases classified elsewhere: Secondary | ICD-10-CM

## 2018-11-19 DIAGNOSIS — E119 Type 2 diabetes mellitus without complications: Secondary | ICD-10-CM

## 2018-11-19 DIAGNOSIS — I10 Essential (primary) hypertension: Secondary | ICD-10-CM

## 2018-11-19 LAB — GLUCOSE, POCT (MANUAL RESULT ENTRY): POC Glucose: 176 mg/dl — AB (ref 70–99)

## 2018-11-19 MED ORDER — SILDENAFIL CITRATE 100 MG PO TABS: 50.0000 mg | ORAL_TABLET | Freq: Every day | ORAL | 3 refills | Status: DC | PRN

## 2018-11-19 MED FILL — SILDENAFIL CITRATE 100 MG T: 100 | 30 days supply | Qty: 5 | Fill #0

## 2018-11-19 NOTE — Patient Instructions (Signed)
Diabetes Mellitus and Nutrition, Adult When you have diabetes (diabetes mellitus), it is very important to have healthy eating habits because your blood sugar (glucose) levels are greatly affected by what you eat and drink. Eating healthy foods in the appropriate amounts, at about the same times every day, can help you:  Control your blood glucose.  Lower your risk of heart disease.  Improve your blood pressure.  Reach or maintain a healthy weight. Every person with diabetes is different, and each person has different needs for a meal plan. Your health care provider may recommend that you work with a diet and nutrition specialist (dietitian) to make a meal plan that is best for you. Your meal plan may vary depending on factors such as:  The calories you need.  The medicines you take.  Your weight.  Your blood glucose, blood pressure, and cholesterol levels.  Your activity level.  Other health conditions you have, such as heart or kidney disease. How do carbohydrates affect me? Carbohydrates, also called carbs, affect your blood glucose level more than any other type of food. Eating carbs naturally raises the amount of glucose in your blood. Carb counting is a method for keeping track of how many carbs you eat. Counting carbs is important to keep your blood glucose at a healthy level, especially if you use insulin or take certain oral diabetes medicines. It is important to know how many carbs you can safely have in each meal. This is different for every person. Your dietitian can help you calculate how many carbs you should have at each meal and for each snack. Foods that contain carbs include:  Bread, cereal, rice, pasta, and crackers.  Potatoes and corn.  Peas, beans, and lentils.  Milk and yogurt.  Fruit and juice.  Desserts, such as cakes, cookies, ice cream, and candy. How does alcohol affect me? Alcohol can cause a sudden decrease in blood glucose (hypoglycemia),  especially if you use insulin or take certain oral diabetes medicines. Hypoglycemia can be a life-threatening condition. Symptoms of hypoglycemia (sleepiness, dizziness, and confusion) are similar to symptoms of having too much alcohol. If your health care provider says that alcohol is safe for you, follow these guidelines:  Limit alcohol intake to no more than 1 drink per day for nonpregnant women and 2 drinks per day for men. One drink equals 12 oz of beer, 5 oz of wine, or 1 oz of hard liquor.  Do not drink on an empty stomach.  Keep yourself hydrated with water, diet soda, or unsweetened iced tea.  Keep in mind that regular soda, juice, and other mixers may contain a lot of sugar and must be counted as carbs. What are tips for following this plan?  Reading food labels  Start by checking the serving size on the "Nutrition Facts" label of packaged foods and drinks. The amount of calories, carbs, fats, and other nutrients listed on the label is based on one serving of the item. Many items contain more than one serving per package.  Check the total grams (g) of carbs in one serving. You can calculate the number of servings of carbs in one serving by dividing the total carbs by 15. For example, if a food has 30 g of total carbs, it would be equal to 2 servings of carbs.  Check the number of grams (g) of saturated and trans fats in one serving. Choose foods that have low or no amount of these fats.  Check the number of  milligrams (mg) of salt (sodium) in one serving. Most people should limit total sodium intake to less than 2,300 mg per day.  Always check the nutrition information of foods labeled as "low-fat" or "nonfat". These foods may be higher in added sugar or refined carbs and should be avoided.  Talk to your dietitian to identify your daily goals for nutrients listed on the label. Shopping  Avoid buying canned, premade, or processed foods. These foods tend to be high in fat, sodium,  and added sugar.  Shop around the outside edge of the grocery store. This includes fresh fruits and vegetables, bulk grains, fresh meats, and fresh dairy. Cooking  Use low-heat cooking methods, such as baking, instead of high-heat cooking methods like deep frying.  Cook using healthy oils, such as olive, canola, or sunflower oil.  Avoid cooking with butter, cream, or high-fat meats. Meal planning  Eat meals and snacks regularly, preferably at the same times every day. Avoid going long periods of time without eating.  Eat foods high in fiber, such as fresh fruits, vegetables, beans, and whole grains. Talk to your dietitian about how many servings of carbs you can eat at each meal.  Eat 4-6 ounces (oz) of lean protein each day, such as lean meat, chicken, fish, eggs, or tofu. One oz of lean protein is equal to: ? 1 oz of meat, chicken, or fish. ? 1 egg. ?  cup of tofu.  Eat some foods each day that contain healthy fats, such as avocado, nuts, seeds, and fish. Lifestyle  Check your blood glucose regularly.  Exercise regularly as told by your health care provider. This may include: ? 150 minutes of moderate-intensity or vigorous-intensity exercise each week. This could be brisk walking, biking, or water aerobics. ? Stretching and doing strength exercises, such as yoga or weightlifting, at least 2 times a week.  Take medicines as told by your health care provider.  Do not use any products that contain nicotine or tobacco, such as cigarettes and e-cigarettes. If you need help quitting, ask your health care provider.  Work with a Social worker or diabetes educator to identify strategies to manage stress and any emotional and social challenges. Questions to ask a health care provider  Do I need to meet with a diabetes educator?  Do I need to meet with a dietitian?  What number can I call if I have questions?  When are the best times to check my blood glucose? Where to find more  information:  American Diabetes Association: diabetes.org  Academy of Nutrition and Dietetics: www.eatright.[employer] of Diabetes and Digestive and Kidney Diseases (NIH): [employer].dk Summary  A healthy meal plan will help you control your blood glucose and maintain a healthy lifestyle.  Working with a diet and nutrition specialist (dietitian) can help you make a meal plan that is best for you.  Keep in mind that carbohydrates (carbs) and alcohol have immediate effects on your blood glucose levels. It is important to count carbs and to use alcohol carefully. This information is not intended to replace advice given to you by your health care provider. Make sure you discuss any questions you have with your health care provider. Document Released: 01/04/2005 Document Revised: 03/22/2017 Document Reviewed: 05/14/2016 Elsevier Patient Education  2020 [employer]. Alcohol Abuse and Dependence Information, Adult Alcohol is a widely available drug. People drink alcohol in different amounts. People who drink alcohol very often and in large amounts often have problems during and after drinking. They  may develop what is called an alcohol use disorder. There are two main types of alcohol use disorders:  Alcohol abuse. This is when you use alcohol too much or too often. You may use alcohol to make yourself feel happy or to reduce stress. You may have a hard time setting a limit on the amount you drink.  Alcohol dependence. This is when you use alcohol consistently for a period of time, and your body changes as a result. This can make it hard to stop drinking because you may start to feel sick or feel different when you do not use alcohol. These symptoms are known as withdrawal. How can alcohol abuse and dependence affect me? Alcohol abuse and dependence can have a negative effect on your life. Drinking too much can lead to addiction. You may feel like you need alcohol to function  normally. You may drink alcohol before work in the morning, during the day, or as soon as you get home from work in the evening. These actions can result in:  Poor work performance.  Job loss.  Financial problems.  Car crashes or criminal charges from driving after drinking alcohol.  Problems in your relationships with friends and family.  Losing the trust and respect of coworkers, friends, and family. Drinking heavily over a long period of time can permanently damage your body and brain, and can cause lifelong health issues, such as:  Damage to your liver or pancreas.  Heart problems, high blood pressure, or stroke.  Certain cancers.  Decreased ability to fight infections.  Brain or nerve damage.  Depression.  Early (premature) death. If you are careless or you crave alcohol, it is easy to drink more than your body can handle (overdose). Alcohol overdose is a serious situation that requires hospitalization. It may lead to permanent injuries or death. What can increase my risk?  Having a family history of alcohol abuse.  Having depression or other mental health conditions.  Beginning to drink at an early age.  Binge drinking often.  Experiencing trauma, stress, and an unstable home life during childhood.  Spending time with people who drink often. What actions can I take to prevent or manage alcohol abuse and dependence?  Do not drink alcohol if: ? Your health care provider tells you not to drink. ? You are pregnant, may be pregnant, or are planning to become pregnant.  If you drink alcohol: ? Limit how much you use to:  0-1 drink a day for women.  0-2 drinks a day for men. ? Be aware of how much alcohol is in your drink. In the [place] one drink equals one 12 oz bottle of beer (355 mL), one 5 oz glass of wine (148 mL), or one 1 oz glass of hard liquor (44 mL).  Stop drinking if you have been drinking too much. This can be very hard to do if you are used to  abusing alcohol. If you begin to have withdrawal symptoms, talk with your health care provider or a person that you trust. These symptoms may include anxiety, shaky hands, headache, nausea, sweating, or not being able to sleep.  Choose to drink nonalcoholic beverages in social gatherings and places where there may be alcohol. Activity  Spend more time on activities that you enjoy that do not involve alcohol, like hobbies or exercise.  Find healthy ways to cope with stress, such as exercise, meditation, or spending time with people you care about. General information  Talk to your family, coworkers,  and friends about supporting you in your efforts to stop drinking. If they drink, ask them not to drink around you. Spend more time with people who do not drink alcohol.  If you think that you have an alcohol dependency problem: ? Tell friends or family about your concerns. ? Talk with your health care provider or another health professional about where to get help. ? Work with a Paramedictherapist and a Network engineerchemical dependency counselor. ? Consider joining a support group for people who struggle with alcohol abuse and dependence. Where to find support   Your health care provider.  SMART Recovery: www.smartrecovery.org Therapy and support groups  Local treatment centers or chemical dependency counselors.  Local AA groups in your community: SalaryStart.tnwww.aa.org Where to find more information  Centers for Disease Control and Prevention: FootballExhibition.com.brwww.cdc.gov  [employer] Institute on Alcohol Abuse and Alcoholism: BasicStudents.dkwww.niaaa.nih.gov  Alcoholics Anonymous (AA): SalaryStart.tnwww.aa.org Contact a health care provider if:  You drank more or for longer than you intended on more than one occasion.  You tried to stop drinking or to cut back on how much you drink, but you were not able to.  You often drink to the point of vomiting or passing out.  You want to drink so badly that you cannot think about anything else.  You have problems in  your life due to drinking, but you continue to drink.  You keep drinking even though you feel anxious, depressed, or have experienced memory loss.  You have stopped doing the things you used to enjoy in order to drink.  You have to drink more than you used to in order to get the effect you want.  You experience anxiety, sweating, nausea, shakiness, and trouble sleeping when you try to stop drinking. Get help right away if:  You have thoughts about hurting yourself or others.  You have serious withdrawal symptoms, including: ? Confusion. ? Racing heart. ? High blood pressure. ? Fever. If you ever feel like you may hurt yourself or others, or have thoughts about taking your own life, get help right away. You can go to your nearest emergency department or call:  Your local emergency services (911 in the [place]  A suicide crisis helpline, such as the National Suicide Prevention Lifeline at 210-329-44211-445 681 7980. This is open 24 hours a day. Summary  Alcohol abuse and dependence can have a negative effect on your life. Drinking too much or too often can lead to addiction.  If you drink alcohol, limit how much you use.  If you are having trouble keeping your drinking under control, find ways to change your behavior. Hobbies, calming activities, exercise, or support groups can help.  If you feel you need help with changing your drinking habits, talk with your health care provider, a good friend, or a therapist, or go to an AA group. This information is not intended to replace advice given to you by your health care provider. Make sure you discuss any questions you have with your health care provider. Document Released: 04/03/2016 Document Revised: 07/29/2018 Document Reviewed: 06/17/2018 Elsevier Patient Education  2020 [employer]

## 2018-11-19 NOTE — Progress Notes (Signed)
Established Patient Office Visit  Subjective:  Patient ID: Tony Dean, male    DOB: Aug 19, 1960  Age: 58 y.o. MRN: [id number]  CC:  Chief Complaint  Patient presents with  . Follow-up    HTN    HPI Tony HusbandsKenneth Wohlford presents for blood pressure follow up started on new mdication previous visit. Patient  and Clinical research associatewriter are excited about his last drink was 1 month ago. Denies shortness of breath, headaches, chest pain or lower extremity edema. He expressed he was having  a problem with his "manly parts not working".  Past Medical History:  Diagnosis Date  . Dizziness   . Gout   . Hypertension     Past Surgical History:  Procedure Laterality Date  . APPENDECTOMY    . INGUINAL HERNIA REPAIR  1990's   right    Family History  Problem Relation Age of Onset  . Colon cancer Neg Hx     Social History   Socioeconomic History  . Marital status: Legally Separated    Spouse name: Not on file  . Number of children: Not on file  . Years of education: Not on file  . Highest education level: Not on file  Occupational History  . Not on file  Social Needs  . Financial resource strain: Not on file  . Food insecurity    Worry: Not on file    Inability: Not on file  . Transportation needs    Medical: Not on file    Non-medical: Not on file  Tobacco Use  . Smoking status: Never Smoker  . Smokeless tobacco: Never Used  Substance and Sexual Activity  . Alcohol use: Yes    Alcohol/week: 22.0 - 24.0 standard drinks    Types: 14 Cans of beer, 8 - 10 Shots of liquor per week  . Drug use: No  . Sexual activity: Not on file  Lifestyle  . Physical activity    Days per week: Not on file    Minutes per session: Not on file  . Stress: Not on file  Relationships  . Social Musicianconnections    Talks on phone: Not on file    Gets together: Not on file    Attends religious service: Not on file    Active member of club or organization: Not on file    Attends meetings of clubs or  organizations: Not on file    Relationship status: Not on file  . Intimate partner violence    Fear of current or ex partner: Not on file    Emotionally abused: Not on file    Physically abused: Not on file    Forced sexual activity: Not on file  Other Topics Concern  . Not on file  Social History Narrative  . Not on file    Outpatient Medications Prior to Visit  Medication Sig Dispense Refill  . allopurinol (ZYLOPRIM) 100 MG tablet Take 1 tablet (100 mg total) by mouth daily. 30 tablet 3  . amLODipine (NORVASC) 10 MG tablet Take 1 tablet (10 mg total) by mouth daily. 90 tablet 0  . atorvastatin (LIPITOR) 20 MG tablet Take 1 tablet (20 mg total) by mouth daily. 90 tablet 0  . carvedilol (COREG) 6.25 MG tablet Take 1 tablet (6.25 mg total) by mouth 2 (two) times daily with a meal. 60 tablet 3  . losartan (COZAAR) 100 MG tablet Take 1 tablet (100 mg total) by mouth daily. 90 tablet 0  . metFORMIN (GLUCOPHAGE) 1000 MG tablet Take  1 tablet (1,000 mg total) by mouth 2 (two) times daily with a meal. 180 tablet 0  . Multiple Vitamins-Minerals (MENS 50+ MULTI VITAMIN/MIN PO) Take by mouth.    . naltrexone (DEPADE) 50 MG tablet Take 1 tablet (50 mg total) by mouth daily. 30 tablet 1   No facility-administered medications prior to visit.     Allergies  Allergen Reactions  . Lisinopril Cough    ROS Review of Systems  Genitourinary:       Erectile dysfunction   Musculoskeletal: Positive for arthralgias.       Shoulder and arms  All other systems reviewed and are negative.     Objective:    Physical Exam  Constitutional: He is oriented to person, place, and time. He appears well-developed and well-nourished.  Neck: Normal range of motion.  Cardiovascular: Normal rate and regular rhythm.  Pulmonary/Chest: Effort normal and breath sounds normal.  Abdominal: Soft. Bowel sounds are normal. He exhibits distension.  Musculoskeletal:     Comments: Chronic pain in shoulders and arms  worked as Retail buyer  Neurological: He is oriented to person, place, and time.  Skin: Skin is warm.  Psychiatric: He has a normal mood and affect.    BP 100/72 (BP Location: Left Arm, Patient Position: Sitting, Cuff Size: Normal)   Pulse 64   Temp (!) 97.3 F (36.3 C) (Tympanic)   Ht 5\' 10"  (1.778 m)   Wt 222 lb 9.6 oz (101 kg)   SpO2 93%   BMI 31.94 kg/m  Wt Readings from Last 3 Encounters:  11/19/18 222 lb 9.6 oz (101 kg)  10/22/18 234 lb (106.1 kg)  08/20/17 225 lb (102.1 kg)     Health Maintenance Due  Topic Date Due  . COLONOSCOPY  02/08/2018    There are no preventive care reminders to display for this patient.  Lab Results  Component Value Date   TSH 0.895 07/10/2017   Lab Results  Component Value Date   WBC 9.1 10/22/2018   HGB 14.6 10/22/2018   HCT 43.7 10/22/2018   MCV 95 10/22/2018   PLT 217 10/22/2018   Lab Results  Component Value Date   NA 145 (H) 10/22/2018   K 4.2 10/22/2018   CO2 24 10/22/2018   GLUCOSE 178 (H) 10/22/2018   BUN 26 (H) 10/22/2018   CREATININE 2.12 (H) 10/22/2018   BILITOT 1.0 10/22/2018   ALKPHOS 77 10/22/2018   AST 20 10/22/2018   ALT 19 10/22/2018   PROT 7.9 10/22/2018   ALBUMIN 4.4 10/22/2018   CALCIUM 10.0 10/22/2018   ANIONGAP 15 11/09/2013   Lab Results  Component Value Date   CHOL 240 (H) 10/22/2018   Lab Results  Component Value Date   HDL 60 10/22/2018   Lab Results  Component Value Date   LDLCALC 149 (H) 10/22/2018   Lab Results  Component Value Date   TRIG 153 (H) 10/22/2018   Lab Results  Component Value Date   CHOLHDL 4.0 10/22/2018   Lab Results  Component Value Date   HGBA1C 6.8 (H) 10/22/2018      Assessment & Plan:   Jamus was seen today for follow-up.  Diagnoses and all orders for this visit:  Type 2 diabetes mellitus without complication, without long-term current use of insulin (HCC) -     Glucose (CBG) 176 A1C 6.8 Foods that are high in carbohydrates are the following  rice, potatoes, breads, sugars, and pastas.  Reduction in the intake (eating) will assist in lowering  your blood sugars.  Hypertension, unspecified type Patient Blood pressure is unremarkable with  medication prescribe amlodipine, losartan and coreg. Continue to take medication as prescribe and , low-sodium, DASH diet, medication compliance. Discussed in detail appropriate food consumption for HTN.  Erectile disorder due to medical condition in male This is a new problem. We discussed different medications for ED including Viagra, Levitra, and Cialis. We also discussed the risks of such medications, including priapism, monocular vision loss, and hypotension with nitrates. All questions were answered. The patient opted for:  [x]   Trial of Viagra; no contraindications.  Alcohlism [facility](HCC)  Alcohol is empty calories that are with elevated sugar can cause elevated blood sugars and decrease desires to eat proper dietary meal plain. Increase proteins and eliminate alcohol. Alcohol can not stop abruptly this may/ can cause withdrawals. Options are alcoholic anynomous or [facility] to manage alcohol withdrawals withhout having withdrawals recommended to contact them. This is a great accomplishment to not have Koolaid (his tag for alcohol) in a month to continue to be successful recommended [facility].  Meds ordered this encounter  Medications  . sildenafil (VIAGRA) 100 MG tablet    Sig: Take 0.5 tablets (50 mg total) by mouth daily as needed for erectile dysfunction.    Dispense:  5 tablet    Refill:  3    Follow-up: Return in about 3 months (around 02/19/2019) for Blood pressure and alcoholism.Grayce Sessions.     P , NP

## 2018-11-25 MED FILL — CARVEDILOL 6.25 MG TABLET: 6.25 | 30 days supply | Qty: 60 | Fill #1

## 2018-11-28 MED FILL — SILDENAFIL CITRATE 100 MG T: 100 | 30 days supply | Qty: 5 | Fill #0

## 2018-12-01 ENCOUNTER — Other Ambulatory Visit: Payer: Self-pay | Admitting: Internal Medicine

## 2018-12-01 ENCOUNTER — Other Ambulatory Visit: Payer: Self-pay | Admitting: Nurse Practitioner

## 2018-12-01 DIAGNOSIS — N183 Chronic kidney disease, stage 3 unspecified: Secondary | ICD-10-CM

## 2018-12-03 ENCOUNTER — Other Ambulatory Visit: Payer: Self-pay

## 2018-12-03 MED FILL — ATORVASTATIN 20 MG TABLET: 20 | 30 days supply | Qty: 30 | Fill #1

## 2018-12-03 MED FILL — ALLOPURINOL 100 MG TABLET: 100 | 30 days supply | Qty: 30 | Fill #1

## 2018-12-03 MED FILL — metFORMIN HCL 1000 MG TABS: 1000 | 30 days supply | Qty: 60 | Fill #1

## 2018-12-03 MED FILL — AMLODIPINE BESYLATE 10 MG T: 10 | 30 days supply | Qty: 30 | Fill #1

## 2018-12-10 ENCOUNTER — Other Ambulatory Visit: Payer: Self-pay

## 2018-12-17 MED FILL — LOSARTAN POTASSIUM 100 MG T: 100 | 30 days supply | Qty: 30 | Fill #1

## 2018-12-30 MED FILL — LOSARTAN POTASSIUM 25 MG TA: 25 | 30 days supply | Qty: 30 | Fill #0

## 2019-01-07 ENCOUNTER — Ambulatory Visit
Admission: RE | Admit: 2019-01-07 | Discharge: 2019-01-07 | Disposition: A | Discharge status: Home / Self Care | Payer: No Typology Code available for payment source | Source: Ambulatory Visit | Attending: Internal Medicine | Admitting: Internal Medicine

## 2019-01-07 DIAGNOSIS — N183 Chronic kidney disease, stage 3 unspecified: Secondary | ICD-10-CM

## 2019-01-19 ENCOUNTER — Ambulatory Visit (INDEPENDENT_AMBULATORY_CARE_PROVIDER_SITE_OTHER): Payer: Self-pay | Admitting: Primary Care

## 2019-01-19 ENCOUNTER — Encounter (INDEPENDENT_AMBULATORY_CARE_PROVIDER_SITE_OTHER): Payer: Self-pay | Admitting: Primary Care

## 2019-01-19 ENCOUNTER — Other Ambulatory Visit: Payer: Self-pay

## 2019-01-19 DIAGNOSIS — E119 Type 2 diabetes mellitus without complications: Secondary | ICD-10-CM

## 2019-01-19 DIAGNOSIS — E7841 Elevated Lipoprotein(a): Secondary | ICD-10-CM

## 2019-01-19 DIAGNOSIS — E790 Hyperuricemia without signs of inflammatory arthritis and tophaceous disease: Secondary | ICD-10-CM

## 2019-01-19 DIAGNOSIS — I10 Essential (primary) hypertension: Secondary | ICD-10-CM

## 2019-01-19 DIAGNOSIS — M62838 Other muscle spasm: Secondary | ICD-10-CM

## 2019-01-19 MED ORDER — LOSARTAN POTASSIUM 100 MG PO TABS: 100.0000 mg | ORAL_TABLET | Freq: Every day | ORAL | 0 refills | Status: DC

## 2019-01-19 MED ORDER — CYCLOBENZAPRINE HCL 10 MG PO TABS: 10.0000 mg | ORAL_TABLET | Freq: Three times a day (TID) | ORAL | 0 refills | Status: DC | PRN

## 2019-01-19 MED ORDER — ALLOPURINOL 100 MG PO TABS: 100.0000 mg | ORAL_TABLET | Freq: Every day | ORAL | 3 refills | Status: DC

## 2019-01-19 MED ORDER — ATORVASTATIN CALCIUM 20 MG PO TABS: 20.0000 mg | ORAL_TABLET | Freq: Every day | ORAL | 0 refills | Status: DC

## 2019-01-19 MED ORDER — AMLODIPINE BESYLATE 10 MG PO TABS: 10.0000 mg | ORAL_TABLET | Freq: Every day | ORAL | 0 refills | Status: DC

## 2019-01-19 MED ORDER — METFORMIN HCL 1000 MG PO TABS: 1000.0000 mg | ORAL_TABLET | Freq: Two times a day (BID) | ORAL | 0 refills | Status: DC

## 2019-01-19 MED ORDER — CARVEDILOL 6.25 MG PO TABS: 6.2500 mg | ORAL_TABLET | Freq: Two times a day (BID) | ORAL | 3 refills | Status: DC

## 2019-01-19 MED FILL — LOSARTAN POTASSIUM 100 MG T: 100 | 30 days supply | Qty: 30 | Fill #0

## 2019-01-19 MED FILL — CARVEDILOL 6.25 MG TABLET: 6.25 | 30 days supply | Qty: 60 | Fill #0

## 2019-01-19 MED FILL — AMLODIPINE BESYLATE 10 MG T: 10 | 30 days supply | Qty: 30 | Fill #0

## 2019-01-19 MED FILL — ALLOPURINOL 100 MG TABLET: 100 | 30 days supply | Qty: 30 | Fill #0

## 2019-01-19 MED FILL — ATORVASTATIN CALCIUM 20 MG: 20 | 30 days supply | Qty: 30 | Fill #0

## 2019-01-19 MED FILL — CYCLOBENZAPRINE 10 MG TAB: 10 | 10 days supply | Qty: 30 | Fill #0

## 2019-01-19 MED FILL — metFORMIN HCL 1000 MG TABS: 1000 | 30 days supply | Qty: 60 | Fill #0

## 2019-01-19 NOTE — Progress Notes (Signed)
Virtual Visit via Telephone Note  I connected with Tony Dean on 01/19/19 at  9:10 AM EDT by telephone and verified that I am speaking with the correct person using two identifiers.   I discussed the limitations, risks, security and privacy concerns of performing an evaluation and management service by telephone and the availability of in person appointments. I also discussed with the patient that there may be a patient responsible charge related to this service. The patient expressed understanding and agreed to proceed.   History of Present Illness: [name] is having a tele visit for what he feels is muscles spasm only on the right side starting at his neck and migrates down his arm. Asked if the area between his neck and shoulder was swollen or tender and painful no. He is a diabetic than described pens and needles that is more like neuropathy. Patient is adamant about having muscle spasm.    Past Medical History:  Diagnosis Date  . Dizziness   . Gout   . Hypertension    Observations/Objective: Review of Systems  Musculoskeletal: Positive for neck pain.       Spasm from neck to arm  All other systems reviewed and are negative.   Assessment and Plan: Farrell was seen today for spasms.  Diagnoses and all orders for this visit:  Type 2 diabetes mellitus without complication, without long-term current use of insulin (HCC)  Hypertension, unspecified type -     amLODipine (NORVASC) 10 MG tablet; Take 1 tablet (10 mg total) by mouth daily. -     losartan (COZAAR) 100 MG tablet; Take 1 tablet (100 mg total) by mouth daily. -     carvedilol (COREG) 6.25 MG tablet; Take 1 tablet (6.25 mg total) by mouth 2 (two) times daily with a meal.  Hyperuricemia Gout is an inflammatory arthritis related to a hyperuricemia.  Risk factors greater than 40, male gender, increase in purines red meats and seafoods, alcohol smoking, and obesity greater than 30 -     allopurinol (ZYLOPRIM)  100 MG tablet; Take 1 tablet (100 mg total) by mouth daily.  Elevated lipoprotein(a) Encourage to decrease your fatty foods, red meat, cheese, milk and increase fiber like whole grains and veggies. You can also add a fiber supplement like Metamucil or Benefiber.  -     atorvastatin (LIPITOR) 20 MG tablet; Take 1 tablet (20 mg total) by mouth daily.  Muscle spasm Paresthesia pricking, tingling, and numbness in any section of the body.It is an anomalous condition characterized by sensations of itching, burning, tingling, prickling, or numbness. Paresthesia may also be described as skin-crawling or pins-and-needles sensations. cyclobenzaprine (FLEXERIL) 10 MG tablet; Take 1 tablet (10 mg total) by mouth 3 (three) times daily as needed for muscle spasms.   Other orders -     metFORMIN (GLUCOPHAGE) 1000 MG tablet; Take 1 tablet (1,000 mg total) by mouth 2 (two) times daily with a meal. -     cyclobenzaprine (FLEXERIL) 10 MG tablet; Take 1 tablet (10 mg total) by mouth 3 (three) times daily as needed for muscle spasms.    Follow Up Instructions:    I discussed the assessment and treatment plan with the patient. The patient was provided an opportunity to ask questions and all were answered. The patient agreed with the plan and demonstrated an understanding of the instructions.   The patient was advised to call back or seek an in-person evaluation if the symptoms worsen or if the condition fails to  improve as anticipated.  I provided 20 minutes of non-face-to-face time during this encounter.   Grayce Sessions, NP

## 2019-02-19 ENCOUNTER — Ambulatory Visit (INDEPENDENT_AMBULATORY_CARE_PROVIDER_SITE_OTHER): Payer: Self-pay | Admitting: Primary Care

## 2019-02-19 ENCOUNTER — Other Ambulatory Visit: Payer: Self-pay

## 2019-02-19 ENCOUNTER — Encounter (INDEPENDENT_AMBULATORY_CARE_PROVIDER_SITE_OTHER): Payer: Self-pay | Admitting: Primary Care

## 2019-02-19 VITALS — BP 150/97 | HR 44 | Temp 97.1°F | Ht 70.0 in | Wt 214.8 lb

## 2019-02-19 DIAGNOSIS — E790 Hyperuricemia without signs of inflammatory arthritis and tophaceous disease: Secondary | ICD-10-CM

## 2019-02-19 DIAGNOSIS — Z125 Encounter for screening for malignant neoplasm of prostate: Secondary | ICD-10-CM

## 2019-02-19 DIAGNOSIS — I10 Essential (primary) hypertension: Secondary | ICD-10-CM

## 2019-02-19 DIAGNOSIS — E7841 Elevated Lipoprotein(a): Secondary | ICD-10-CM

## 2019-02-19 MED ORDER — ALLOPURINOL 100 MG PO TABS: 100.0000 mg | ORAL_TABLET | Freq: Every day | ORAL | 3 refills | Status: DC

## 2019-02-19 MED ORDER — AMLODIPINE BESYLATE 10 MG PO TABS: 10.0000 mg | ORAL_TABLET | Freq: Every day | ORAL | 0 refills | Status: DC

## 2019-02-19 MED ORDER — CARVEDILOL 6.25 MG PO TABS: 6.2500 mg | ORAL_TABLET | Freq: Two times a day (BID) | ORAL | 3 refills | Status: DC

## 2019-02-19 MED ORDER — LOSARTAN POTASSIUM 100 MG PO TABS: 100.0000 mg | ORAL_TABLET | Freq: Every day | ORAL | 0 refills | Status: DC

## 2019-02-19 MED ORDER — ATORVASTATIN CALCIUM 20 MG PO TABS: 20.0000 mg | ORAL_TABLET | Freq: Every day | ORAL | 0 refills | Status: DC

## 2019-02-19 MED ORDER — MENS 50+ MULTI VITAMIN/MIN PO TABS: 1.0000 | ORAL_TABLET | Freq: Every day | ORAL | 11 refills | Status: DC

## 2019-02-19 MED FILL — LOSARTAN POTASSIUM 100 MG T: 100 | 30 days supply | Qty: 30 | Fill #0

## 2019-02-19 MED FILL — ALLOPURINOL 100 MG TABLET: 100 | 30 days supply | Qty: 30 | Fill #0

## 2019-02-19 MED FILL — CARVEDILOL 6.25 MG TABLET: 6.25 | 30 days supply | Qty: 60 | Fill #0

## 2019-02-19 MED FILL — AMLODIPINE BESYLATE 10 MG T: 10 | 30 days supply | Qty: 30 | Fill #0

## 2019-02-19 MED FILL — ATORVASTATIN CALCIUM 20 MG: 20 | 30 days supply | Qty: 30 | Fill #0

## 2019-02-19 NOTE — Progress Notes (Signed)
Established Patient Office Visit  Subjective:  Patient ID: Tony Dean, male    DOB: May 29, 1960  Age: 58 y.o. MRN: [id number]  CC:  Chief Complaint  Patient presents with  . Hypertension    needs refills   . Alcohol Intoxication    HPI Tony Dean presents for management of hypertension - he denies shortness of breath, headaches, chest pain or lower extremity edema. He states he is out of Bp medication for several days.  Past Medical History:  Diagnosis Date  . Dizziness   . Gout   . Hypertension     Past Surgical History:  Procedure Laterality Date  . APPENDECTOMY    . INGUINAL HERNIA REPAIR  1990's   right    Family History  Problem Relation Age of Onset  . Colon cancer Neg Hx     Social History   Socioeconomic History  . Marital status: Legally Separated    Spouse name: Not on file  . Number of children: Not on file  . Years of education: Not on file  . Highest education level: Not on file  Occupational History  . Not on file  Social Needs  . Financial resource strain: Not on file  . Food insecurity    Worry: Not on file    Inability: Not on file  . Transportation needs    Medical: Not on file    Non-medical: Not on file  Tobacco Use  . Smoking status: Never Smoker  . Smokeless tobacco: Never Used  Substance and Sexual Activity  . Alcohol use: Yes    Alcohol/week: 22.0 - 24.0 standard drinks    Types: 14 Cans of beer, 8 - 10 Shots of liquor per week  . Drug use: No  . Sexual activity: Not on file  Lifestyle  . Physical activity    Days per week: Not on file    Minutes per session: Not on file  . Stress: Not on file  Relationships  . Social Musicianconnections    Talks on phone: Not on file    Gets together: Not on file    Attends religious service: Not on file    Active member of club or organization: Not on file    Attends meetings of clubs or organizations: Not on file    Relationship status: Not on file  . Intimate partner violence     Fear of current or ex partner: Not on file    Emotionally abused: Not on file    Physically abused: Not on file    Forced sexual activity: Not on file  Other Topics Concern  . Not on file  Social History Narrative  . Not on file    Outpatient Medications Prior to Visit  Medication Sig Dispense Refill  . cyclobenzaprine (FLEXERIL) 10 MG tablet Take 1 tablet (10 mg total) by mouth 3 (three) times daily as needed for muscle spasms. 30 tablet 0  . metFORMIN (GLUCOPHAGE) 1000 MG tablet Take 1 tablet (1,000 mg total) by mouth 2 (two) times daily with a meal. 180 tablet 0  . sildenafil (VIAGRA) 100 MG tablet Take 0.5 tablets (50 mg total) by mouth daily as needed for erectile dysfunction. 5 tablet 3  . allopurinol (ZYLOPRIM) 100 MG tablet Take 1 tablet (100 mg total) by mouth daily. 30 tablet 3  . amLODipine (NORVASC) 10 MG tablet Take 1 tablet (10 mg total) by mouth daily. 90 tablet 0  . atorvastatin (LIPITOR) 20 MG tablet Take 1 tablet (20  mg total) by mouth daily. 90 tablet 0  . carvedilol (COREG) 6.25 MG tablet Take 1 tablet (6.25 mg total) by mouth 2 (two) times daily with a meal. 60 tablet 3  . losartan (COZAAR) 100 MG tablet Take 1 tablet (100 mg total) by mouth daily. 90 tablet 0  . Multiple Vitamins-Minerals (MENS 50+ MULTI VITAMIN/MIN PO) Take by mouth.     No facility-administered medications prior to visit.     Allergies  Allergen Reactions  . Lisinopril Cough    ROS Review of Systems  Endocrine: Positive for polyphagia.  Genitourinary: Positive for frequency.       Nocturia  All other systems reviewed and are negative.     Objective:    Physical Exam  Constitutional: He appears well-developed and well-nourished.  HENT:  Head: Normocephalic.  Neck: Neck supple.  Cardiovascular: Normal rate and regular rhythm.  Pulmonary/Chest: Effort normal and breath sounds normal.  Abdominal: Soft. Bowel sounds are normal. He exhibits distension.  Psychiatric: He has a  normal mood and affect.    BP (!) 150/97 (BP Location: Left Arm, Patient Position: Sitting, Cuff Size: Normal)   Pulse (!) 44   Temp (!) 97.1 F (36.2 C) (Temporal)   Ht 5\' 10"  (1.778 m)   Wt 214 lb 12.8 oz (97.4 kg)   SpO2 95%   BMI 30.82 kg/m  Wt Readings from Last 3 Encounters:  02/19/19 214 lb 12.8 oz (97.4 kg)  11/19/18 222 lb 9.6 oz (101 kg)  10/22/18 234 lb (106.1 kg)     Health Maintenance Due  Topic Date Due  . COLONOSCOPY  02/08/2018    There are no preventive care reminders to display for this patient.  Lab Results  Component Value Date   TSH 0.895 07/10/2017   Lab Results  Component Value Date   WBC 9.1 10/22/2018   HGB 14.6 10/22/2018   HCT 43.7 10/22/2018   MCV 95 10/22/2018   PLT 217 10/22/2018   Lab Results  Component Value Date   NA 145 (H) 10/22/2018   K 4.2 10/22/2018   CO2 24 10/22/2018   GLUCOSE 178 (H) 10/22/2018   BUN 26 (H) 10/22/2018   CREATININE 2.12 (H) 10/22/2018   BILITOT 1.0 10/22/2018   ALKPHOS 77 10/22/2018   AST 20 10/22/2018   ALT 19 10/22/2018   PROT 7.9 10/22/2018   ALBUMIN 4.4 10/22/2018   CALCIUM 10.0 10/22/2018   ANIONGAP 15 11/09/2013   Lab Results  Component Value Date   CHOL 240 (H) 10/22/2018   Lab Results  Component Value Date   HDL 60 10/22/2018   Lab Results  Component Value Date   LDLCALC 149 (H) 10/22/2018   Lab Results  Component Value Date   TRIG 153 (H) 10/22/2018   Lab Results  Component Value Date   CHOLHDL 4.0 10/22/2018   Lab Results  Component Value Date   HGBA1C 6.8 (H) 10/22/2018      Assessment & Plan:  Tony Dean was seen today for hypertension and alcohol intoxication.  Diagnoses and all orders for this visit:  Hypertension, unspecified type Bp is not at goal he has been out of medication for a couple of day unable to quantify 1,2, or 3. Counseled on blood pressure goal of less than 130/80, low-sodium diet decrease in sodium -salt, fermented and can foods, medication  compliance, 150 minutes of moderate intensity exercise per week.  Discussed medication compliance, adverse effects. -     amLODipine (NORVASC) 10 MG tablet;  Take 1 tablet (10 mg total) by mouth daily. -     losartan (COZAAR) 100 MG tablet; Take 1 tablet (100 mg total) by mouth daily. -     carvedilol (COREG) 6.25 MG tablet; Take 1 tablet (6.25 mg total) by mouth 2 (two) times daily with a meal.  Hyperuricemia Controlled on allopurinol will check uric acid. Denies any gout attacks. -     allopurinol (ZYLOPRIM) 100 MG tablet; Take 1 tablet (100 mg total) by mouth daily.  Elevated lipoprotein(a)  Counseled on decreasing  fatty foods, red meat, cheese, milk and increase fiber like whole grains and veggies. You can also add a fiber supplement like Metamucil or Benefiber.  -     atorvastatin (LIPITOR) 20 MG tablet; Take 1 tablet (20 mg total) by mouth daily.  Other orders -     Multiple Vitamins-Minerals (MENS 50+ MULTI VITAMIN/MIN) TABS; Take 1 tablet by mouth daily.   Meds ordered this encounter  Medications  . DISCONTD: Multiple Vitamins-Minerals (MENS 50+ MULTI VITAMIN/MIN) TABS    Sig: Take 1 tablet by mouth daily.    Dispense:  30 tablet    Refill:  11  . amLODipine (NORVASC) 10 MG tablet    Sig: Take 1 tablet (10 mg total) by mouth daily.    Dispense:  90 tablet    Refill:  0  . allopurinol (ZYLOPRIM) 100 MG tablet    Sig: Take 1 tablet (100 mg total) by mouth daily.    Dispense:  30 tablet    Refill:  3  . losartan (COZAAR) 100 MG tablet    Sig: Take 1 tablet (100 mg total) by mouth daily.    Dispense:  90 tablet    Refill:  0  . atorvastatin (LIPITOR) 20 MG tablet    Sig: Take 1 tablet (20 mg total) by mouth daily.    Dispense:  90 tablet    Refill:  0  . carvedilol (COREG) 6.25 MG tablet    Sig: Take 1 tablet (6.25 mg total) by mouth 2 (two) times daily with a meal.    Dispense:  60 tablet    Refill:  3    Follow-up: Return in 3 months (on 05/22/2019).    Kerin Perna, NP

## 2019-02-19 NOTE — Patient Instructions (Signed)

## 2019-02-20 LAB — CBC WITH DIFFERENTIAL/PLATELET
Basophils Absolute: 0 10*3/uL (ref 0.0–0.2)
Basos: 0 %
EOS (ABSOLUTE): 0.1 10*3/uL (ref 0.0–0.4)
Eos: 1 %
Hematocrit: 34.7 % — ABNORMAL LOW (ref 37.5–51.0)
Hemoglobin: 11.4 g/dL — ABNORMAL LOW (ref 13.0–17.7)
Immature Grans (Abs): 0 10*3/uL (ref 0.0–0.1)
Immature Granulocytes: 0 %
Lymphocytes Absolute: 2.3 10*3/uL (ref 0.7–3.1)
Lymphs: 33 %
MCH: 28.4 pg (ref 26.6–33.0)
MCHC: 32.9 g/dL (ref 31.5–35.7)
MCV: 86 fL (ref 79–97)
Monocytes Absolute: 0.6 10*3/uL (ref 0.1–0.9)
Monocytes: 8 %
Neutrophils Absolute: 4.2 10*3/uL (ref 1.4–7.0)
Neutrophils: 58 %
Platelets: 288 10*3/uL (ref 150–450)
RBC: 4.02 x10E6/uL — ABNORMAL LOW (ref 4.14–5.80)
RDW: 17.3 % — ABNORMAL HIGH (ref 11.6–15.4)
WBC: 7.2 10*3/uL (ref 3.4–10.8)

## 2019-02-20 LAB — CMP14+EGFR
ALT: 30 IU/L (ref 0–44)
AST: 21 IU/L (ref 0–40)
Albumin/Globulin Ratio: 1.1 — ABNORMAL LOW (ref 1.2–2.2)
Albumin: 4.1 g/dL (ref 3.8–4.9)
Alkaline Phosphatase: 95 IU/L (ref 39–117)
BUN/Creatinine Ratio: 12 (ref 9–20)
BUN: 21 mg/dL (ref 6–24)
Bilirubin Total: 0.6 mg/dL (ref 0.0–1.2)
CO2: 23 mmol/L (ref 20–29)
Calcium: 9.5 mg/dL (ref 8.7–10.2)
Chloride: 104 mmol/L (ref 96–106)
Creatinine, Ser: 1.76 mg/dL — ABNORMAL HIGH (ref 0.76–1.27)
GFR calc Af Amer: 48 mL/min/{1.73_m2} — ABNORMAL LOW (ref >59)
GFR calc non Af Amer: 42 mL/min/{1.73_m2} — ABNORMAL LOW (ref >59)
Globulin, Total: 3.8 g/dL (ref 1.5–4.5)
Glucose: 112 mg/dL — ABNORMAL HIGH (ref 65–99)
Potassium: 4.2 mmol/L (ref 3.5–5.2)
Sodium: 142 mmol/L (ref 134–144)
Total Protein: 7.9 g/dL (ref 6.0–8.5)

## 2019-02-20 LAB — URIC ACID: Uric Acid: 8.2 mg/dL (ref 3.7–8.6)

## 2019-02-20 LAB — LIPID PANEL
Chol/HDL Ratio: 3 ratio (ref 0.0–5.0)
Cholesterol, Total: 191 mg/dL (ref 100–199)
HDL: 63 mg/dL (ref >39)
LDL Chol Calc (NIH): 113 mg/dL — ABNORMAL HIGH (ref 0–99)
Triglycerides: 84 mg/dL (ref 0–149)
VLDL Cholesterol Cal: 15 mg/dL (ref 5–40)

## 2019-02-20 LAB — PSA: Prostate Specific Ag, Serum: 0.8 ng/mL (ref 0.0–4.0)

## 2019-02-22 ENCOUNTER — Other Ambulatory Visit (INDEPENDENT_AMBULATORY_CARE_PROVIDER_SITE_OTHER): Payer: Self-pay | Admitting: Primary Care

## 2019-02-22 MED ORDER — IRON (FERROUS SULFATE) 325 (65 FE) MG PO TABS: 325.0000 mg | ORAL_TABLET | Freq: Every day | ORAL | 11 refills | Status: DC

## 2019-02-22 MED ORDER — SENNA 8.6 MG PO TABS: 1.0000 | ORAL_TABLET | Freq: Every day | ORAL | 0 refills | Status: DC

## 2019-02-23 MED FILL — FERROUS SULFATE 325 MG TAB: 325 (65 FE) | 30 days supply | Qty: 30 | Fill #0

## 2019-05-21 ENCOUNTER — Ambulatory Visit (INDEPENDENT_AMBULATORY_CARE_PROVIDER_SITE_OTHER): Payer: Self-pay | Admitting: Primary Care

## 2019-05-28 ENCOUNTER — Ambulatory Visit (INDEPENDENT_AMBULATORY_CARE_PROVIDER_SITE_OTHER): Payer: Self-pay | Admitting: Primary Care

## 2019-05-28 ENCOUNTER — Other Ambulatory Visit: Payer: Self-pay

## 2019-05-28 ENCOUNTER — Encounter (INDEPENDENT_AMBULATORY_CARE_PROVIDER_SITE_OTHER): Payer: Self-pay | Admitting: Primary Care

## 2019-05-28 VITALS — BP 203/103 | HR 105 | Temp 97.2°F | Ht 70.0 in | Wt 231.0 lb

## 2019-05-28 DIAGNOSIS — E7841 Elevated Lipoprotein(a): Secondary | ICD-10-CM

## 2019-05-28 DIAGNOSIS — M5440 Lumbago with sciatica, unspecified side: Secondary | ICD-10-CM

## 2019-05-28 DIAGNOSIS — E790 Hyperuricemia without signs of inflammatory arthritis and tophaceous disease: Secondary | ICD-10-CM

## 2019-05-28 DIAGNOSIS — I10 Essential (primary) hypertension: Secondary | ICD-10-CM

## 2019-05-28 DIAGNOSIS — E119 Type 2 diabetes mellitus without complications: Secondary | ICD-10-CM

## 2019-05-28 LAB — POCT GLYCOSYLATED HEMOGLOBIN (HGB A1C): Hemoglobin A1C: 6.9 % — AB (ref 4.0–5.6)

## 2019-05-28 LAB — GLUCOSE, POCT (MANUAL RESULT ENTRY): POC Glucose: 167 mg/dl — AB (ref 70–99)

## 2019-05-28 MED ORDER — ATORVASTATIN CALCIUM 20 MG PO TABS: 20.0000 mg | ORAL_TABLET | Freq: Every day | ORAL | 1 refills | Status: DC

## 2019-05-28 MED ORDER — CLONIDINE HCL 0.1 MG PO TABS
0.2000 mg | ORAL_TABLET | Freq: Once | ORAL | Status: AC
  Administered 2019-05-28: 11:00:00 0.2 mg via ORAL

## 2019-05-28 MED ORDER — METFORMIN HCL 1000 MG PO TABS: 1000.0000 mg | ORAL_TABLET | Freq: Two times a day (BID) | ORAL | 1 refills | Status: DC

## 2019-05-28 MED ORDER — LOSARTAN POTASSIUM 100 MG PO TABS: 100.0000 mg | ORAL_TABLET | Freq: Every day | ORAL | 1 refills | Status: DC

## 2019-05-28 MED ORDER — SENNA 8.6 MG PO TABS: 1.0000 | ORAL_TABLET | Freq: Every day | ORAL | 0 refills | Status: DC

## 2019-05-28 MED ORDER — ALLOPURINOL 100 MG PO TABS: 100.0000 mg | ORAL_TABLET | Freq: Every day | ORAL | 1 refills | Status: DC

## 2019-05-28 MED ORDER — CARVEDILOL 6.25 MG PO TABS: 6.2500 mg | ORAL_TABLET | Freq: Two times a day (BID) | ORAL | 1 refills | Status: DC

## 2019-05-28 MED ORDER — AMLODIPINE BESYLATE 10 MG PO TABS: 10.0000 mg | ORAL_TABLET | Freq: Every day | ORAL | 1 refills | Status: DC

## 2019-05-28 MED FILL — AMLODIPINE BESYLATE 10 MG T: 10 | 30 days supply | Qty: 30 | Fill #0

## 2019-05-28 MED FILL — CARVEDILOL 6.25 MG TABLET: 6.25 | 30 days supply | Qty: 60 | Fill #0

## 2019-05-28 MED FILL — metFORMIN HCL 1000 MG TABS: 1000 | 30 days supply | Qty: 60 | Fill #0

## 2019-05-28 MED FILL — ATORVASTATIN CALCIUM 20 MG: 20 | 30 days supply | Qty: 30 | Fill #0

## 2019-05-28 MED FILL — LOSARTAN POTASSIUM 100 MG T: 100 | 30 days supply | Qty: 30 | Fill #0

## 2019-05-28 MED FILL — ALLOPURINOL 100 MG TABLET: 100 | 30 days supply | Qty: 30 | Fill #0

## 2019-05-28 NOTE — Patient Instructions (Signed)

## 2019-05-28 NOTE — Progress Notes (Signed)
Established Patient Office Visit  Subjective:  Patient ID: Tony Dean, male    DOB: Sep 15, 1960  Age: 59 y.o. MRN: [id number]  CC:  Chief Complaint  Patient presents with  . Follow-up    DM/HTN  . Medication Refill    HPI Jian Hodgman presents for management of hypertension he has been out of medication for a couple of days reviewing review last prescribed 02/19/2019 90 day supply with no refills . Presents with 184/133 treat with clonidine .2mg  in office . He denies shortness of breath, headaches, chest pain or lower extremity edema. He does complain of back pain chronic lower back hurts more with walking.  Past Medical History:  Diagnosis Date  . Dizziness   . Gout   . Hypertension     Past Surgical History:  Procedure Laterality Date  . APPENDECTOMY    . INGUINAL HERNIA REPAIR  1990's   right    Family History  Problem Relation Age of Onset  . Colon cancer Neg Hx     Social History   Socioeconomic History  . Marital status: Legally Separated    Spouse name: Not on file  . Number of children: Not on file  . Years of education: Not on file  . Highest education level: Not on file  Occupational History  . Not on file  Tobacco Use  . Smoking status: Never Smoker  . Smokeless tobacco: Never Used  Substance and Sexual Activity  . Alcohol use: Yes    Alcohol/week: 22.0 - 24.0 standard drinks    Types: 14 Cans of beer, 8 - 10 Shots of liquor per week  . Drug use: No  . Sexual activity: Not on file  Other Topics Concern  . Not on file  Social History Narrative  . Not on file   Social Determinants of Health   Financial Resource Strain:   . Difficulty of Paying Living Expenses: Not on file  Food Insecurity:   . Worried About in the Last Year: Not on file  . Ran Out of Food in the Last Year: Not on file  Transportation Needs:   . Lack of Transportation (Medical): Not on file  . Lack of Transportation (Non-Medical): Not on file   Physical Activity:   . Days of Exercise per Week: Not on file  . Minutes of Exercise per Session: Not on file  Stress:   . Feeling of Stress : Not on file  Social Connections:   . Frequency of Communication with Friends and Family: Not on file  . Frequency of Social Gatherings with Friends and Family: Not on file  . Attends Religious Services: Not on file  . Active Member of Clubs or Organizations: Not on file  . Attends Programme researcher, broadcasting/film/video Meetings: Not on file  . Marital Status: Not on file  Intimate Partner Violence:   . Fear of Current or Ex-Partner: Not on file  . Emotionally Abused: Not on file  . Physically Abused: Not on file  . Sexually Abused: Not on file    Outpatient Medications Prior to Visit  Medication Sig Dispense Refill  . cyclobenzaprine (FLEXERIL) 10 MG tablet Take 1 tablet (10 mg total) by mouth 3 (three) times daily as needed for muscle spasms. 30 tablet 0  . Iron, Ferrous Sulfate, 325 (65 Fe) MG TABS Take 325 mg by mouth daily. 30 tablet 11  . allopurinol (ZYLOPRIM) 100 MG tablet Take 1 tablet (100 mg total) by mouth daily. 30  tablet 3  . amLODipine (NORVASC) 10 MG tablet Take 1 tablet (10 mg total) by mouth daily. 90 tablet 0  . atorvastatin (LIPITOR) 20 MG tablet Take 1 tablet (20 mg total) by mouth daily. 90 tablet 0  . carvedilol (COREG) 6.25 MG tablet Take 1 tablet (6.25 mg total) by mouth 2 (two) times daily with a meal. 60 tablet 3  . losartan (COZAAR) 100 MG tablet Take 1 tablet (100 mg total) by mouth daily. 90 tablet 0  . metFORMIN (GLUCOPHAGE) 1000 MG tablet Take 1 tablet (1,000 mg total) by mouth 2 (two) times daily with a meal. 180 tablet 0  . senna (SENOKOT) 8.6 MG TABS tablet Take 1 tablet (8.6 mg total) by mouth daily. 120 tablet 0  . sildenafil (VIAGRA) 100 MG tablet Take 0.5 tablets (50 mg total) by mouth daily as needed for erectile dysfunction. (Patient not taking: Reported on 05/28/2019) 5 tablet 3   No facility-administered medications  prior to visit.    Allergies  Allergen Reactions  . Lisinopril Cough    ROS Review of Systems  Musculoskeletal: Positive for back pain.  All other systems reviewed and are negative.     Objective:    Physical Exam  Constitutional: He is oriented to person, place, and time. He appears well-developed and well-nourished.  HENT:  Head: Normocephalic.  Eyes: Pupils are equal, round, and reactive to light. EOM are normal.  Cardiovascular: Normal rate and regular rhythm.  Pulmonary/Chest: Effort normal and breath sounds normal.  Abdominal: Soft. Bowel sounds are normal.  Musculoskeletal:        General: Normal range of motion.     Cervical back: Normal range of motion and neck supple.  Neurological: He is oriented to person, place, and time. He has normal reflexes.  Skin: Skin is warm and dry.  Psychiatric: He has a normal mood and affect. His behavior is normal. Judgment and thought content normal.    BP (!) 184/133 (BP Location: Right Arm, Patient Position: Sitting, Cuff Size: Large)   Pulse (!) 111   Temp (!) 97.2 F (36.2 C) (Temporal)   Ht 5\' 10"  (1.778 m)   Wt 231 lb (104.8 kg)   SpO2 92%   BMI 33.15 kg/m  Wt Readings from Last 3 Encounters:  05/28/19 231 lb (104.8 kg)  02/19/19 214 lb 12.8 oz (97.4 kg)  11/19/18 222 lb 9.6 oz (101 kg)     There are no preventive care reminders to display for this patient.  There are no preventive care reminders to display for this patient.  Lab Results  Component Value Date   TSH 0.895 07/10/2017   Lab Results  Component Value Date   WBC 7.2 02/19/2019   HGB 11.4 (L) 02/19/2019   HCT 34.7 (L) 02/19/2019   MCV 86 02/19/2019   PLT 288 02/19/2019   Lab Results  Component Value Date   NA 142 02/19/2019   K 4.2 02/19/2019   CO2 23 02/19/2019   GLUCOSE 112 (H) 02/19/2019   BUN 21 02/19/2019   CREATININE 1.76 (H) 02/19/2019   BILITOT 0.6 02/19/2019   ALKPHOS 95 02/19/2019   AST 21 02/19/2019   ALT 30 02/19/2019    PROT 7.9 02/19/2019   ALBUMIN 4.1 02/19/2019   CALCIUM 9.5 02/19/2019   ANIONGAP 15 11/09/2013   Lab Results  Component Value Date   CHOL 191 02/19/2019   Lab Results  Component Value Date   HDL 63 02/19/2019   Lab Results  Component  Value Date   LDLCALC 113 (H) 02/19/2019   Lab Results  Component Value Date   TRIG 84 02/19/2019   Lab Results  Component Value Date   CHOLHDL 3.0 02/19/2019   Lab Results  Component Value Date   HGBA1C 6.9 (A) 05/28/2019      Assessment & Plan:  Nile was seen today for follow-up and medication refill.  Diagnoses and all orders for this visit:  Type 2 diabetes mellitus without complication, without long-term current use of insulin (HCC) -     HgB A1c -     Glucose (CBG)  Hypertension, unspecified type Counseled on blood pressure goal of less than 130/80, low-sodium, DASH diet, medication compliance, 150 minutes of moderate intensity exercise per week. Discussed medication compliance, risk of heart attack and stroke , AAM and obesed -     cloNIDine (CATAPRES) tablet 0.2 mg -     amLODipine (NORVASC) 10 MG tablet; Take 1 tablet (10 mg total) by mouth daily. -     losartan (COZAAR) 100 MG tablet; Take 1 tablet (100 mg total) by mouth daily. -     carvedilol (COREG) 6.25 MG tablet; Take 1 tablet (6.25 mg total) by mouth 2 (two) times daily with a meal.  Hyperuricemia -     allopurinol (ZYLOPRIM) 100 MG tablet; Take 1 tablet (100 mg total) by mouth daily.  Elevated lipoprotein(a) Cholesterol that is not managed properly can lead to heart attack and stroke.  Decrease your fatty foods, red meat, cheese, milk and increase fiber like whole grains and veggies.  Continue statin f/u repeat lipids -     atorvastatin (LIPITOR) 20 MG tablet; Take 1 tablet (20 mg total) by mouth daily.  Bilateral low back pain with sciatica, sciatica laterality unspecified, unspecified chronicity BACK PAIN  Location: middle of back Quality  Throbbing pain  pull muscle pain 7/ Onset: {2 months Worse with: walking      Better with: sitting/lying dow Radiation: no Trauma: no Best sitting/standing/leaning forward: sitting down leaning forward Red [place]ags Fecal/urinary incontinence: no  Numbness/Weakness:middle of back  Fever/chills/sweats: no  Night pain: no/morning Unexplained weight loss: no No relief with bedrest: relief with bedrest  Other orders -     senna (SENOKOT) 8.6 MG TABS tablet; Take 1 tablet (8.6 mg total) by mouth daily. -     metFORMIN (GLUCOPHAGE) 1000 MG tablet; Take 1 tablet (1,000 mg total) by mouth 2 (two) times daily with a meal.    Meds ordered this encounter  Medications  . cloNIDine (CATAPRES) tablet 0.2 mg  . senna (SENOKOT) 8.6 MG TABS tablet    Sig: Take 1 tablet (8.6 mg total) by mouth daily.    Dispense:  120 tablet    Refill:  0  . metFORMIN (GLUCOPHAGE) 1000 MG tablet    Sig: Take 1 tablet (1,000 mg total) by mouth 2 (two) times daily with a meal.    Dispense:  180 tablet    Refill:  1  . amLODipine (NORVASC) 10 MG tablet    Sig: Take 1 tablet (10 mg total) by mouth daily.    Dispense:  90 tablet    Refill:  1  . losartan (COZAAR) 100 MG tablet    Sig: Take 1 tablet (100 mg total) by mouth daily.    Dispense:  90 tablet    Refill:  1  . allopurinol (ZYLOPRIM) 100 MG tablet    Sig: Take 1 tablet (100 mg total) by mouth daily.  Dispense:  90 tablet    Refill:  1  . atorvastatin (LIPITOR) 20 MG tablet    Sig: Take 1 tablet (20 mg total) by mouth daily.    Dispense:  90 tablet    Refill:  1  . carvedilol (COREG) 6.25 MG tablet    Sig: Take 1 tablet (6.25 mg total) by mouth 2 (two) times daily with a meal.    Dispense:  180 tablet    Refill:  1    Follow-up: Return in about 6 weeks (around 07/09/2019) for in person BP check .    Kerin Perna, NP

## 2019-07-09 ENCOUNTER — Ambulatory Visit (INDEPENDENT_AMBULATORY_CARE_PROVIDER_SITE_OTHER): Payer: Self-pay | Admitting: Primary Care

## 2019-07-10 ENCOUNTER — Encounter (INDEPENDENT_AMBULATORY_CARE_PROVIDER_SITE_OTHER): Payer: Self-pay | Admitting: Family Medicine

## 2019-07-10 ENCOUNTER — Ambulatory Visit (INDEPENDENT_AMBULATORY_CARE_PROVIDER_SITE_OTHER): Payer: Self-pay | Admitting: Family Medicine

## 2019-07-10 ENCOUNTER — Other Ambulatory Visit: Payer: Self-pay

## 2019-07-10 VITALS — BP 160/110 | HR 77 | Temp 95.5°F | Ht 70.0 in | Wt 224.8 lb

## 2019-07-10 DIAGNOSIS — E119 Type 2 diabetes mellitus without complications: Secondary | ICD-10-CM

## 2019-07-10 DIAGNOSIS — I160 Hypertensive urgency: Secondary | ICD-10-CM

## 2019-07-10 DIAGNOSIS — Z9114 Patient's other noncompliance with medication regimen: Secondary | ICD-10-CM

## 2019-07-10 LAB — POCT CBG (FASTING - GLUCOSE)-MANUAL ENTRY: Glucose Fasting, POC: 159 mg/dL — AB (ref 70–99)

## 2019-07-10 MED ORDER — CLONIDINE HCL 0.1 MG PO TABS
0.1000 mg | ORAL_TABLET | Freq: Once | ORAL | Status: AC
  Administered 2019-07-10: 12:00:00 0.1 mg via ORAL

## 2019-07-10 NOTE — Progress Notes (Signed)
Subjective:  Patient ID: Tony Dean, male    DOB: [birth year]  Age: 59 y.o. MRN: [id number]  CC: Blood Pressure Check   HPI Jameis Newsham is a 59 year old male with a history of Hypertension, Type 2 Diabetes Mellitus (A1c 6.9) here for a follow up of Hypertension. His BP is severely elevated and he is yet to take his antihypertensive. States he did not want to take it on an empty stomach as he is yet to have breakfast. He is not compliant with a low sodium diet and does not exercise regularly.  Past Medical History:  Diagnosis Date  . Dizziness   . Gout   . Hypertension     Past Surgical History:  Procedure Laterality Date  . APPENDECTOMY    . INGUINAL HERNIA REPAIR  1990's   right    Family History  Problem Relation Age of Onset  . Colon cancer Neg Hx     Allergies  Allergen Reactions  . Lisinopril Cough    Outpatient Medications Prior to Visit  Medication Sig Dispense Refill  . allopurinol (ZYLOPRIM) 100 MG tablet Take 1 tablet (100 mg total) by mouth daily. 90 tablet 1  . amLODipine (NORVASC) 10 MG tablet Take 1 tablet (10 mg total) by mouth daily. 90 tablet 1  . atorvastatin (LIPITOR) 20 MG tablet Take 1 tablet (20 mg total) by mouth daily. 90 tablet 1  . carvedilol (COREG) 6.25 MG tablet Take 1 tablet (6.25 mg total) by mouth 2 (two) times daily with a meal. 180 tablet 1  . Iron, Ferrous Sulfate, 325 (65 Fe) MG TABS Take 325 mg by mouth daily. 30 tablet 11  . losartan (COZAAR) 100 MG tablet Take 1 tablet (100 mg total) by mouth daily. 90 tablet 1  . metFORMIN (GLUCOPHAGE) 1000 MG tablet Take 1 tablet (1,000 mg total) by mouth 2 (two) times daily with a meal. 180 tablet 1  . senna (SENOKOT) 8.6 MG TABS tablet Take 1 tablet (8.6 mg total) by mouth daily. 120 tablet 0  . sildenafil (VIAGRA) 100 MG tablet Take 0.5 tablets (50 mg total) by mouth daily as needed for erectile dysfunction. (Patient not taking: Reported on 07/10/2019) 5 tablet 3  . cyclobenzaprine  (FLEXERIL) 10 MG tablet Take 1 tablet (10 mg total) by mouth 3 (three) times daily as needed for muscle spasms. 30 tablet 0   No facility-administered medications prior to visit.     ROS Review of Systems  Constitutional: Negative for activity change and appetite change.  HENT: Negative for sinus pressure and sore throat.   Eyes: Negative for visual disturbance.  Respiratory: Negative for cough, chest tightness and shortness of breath.   Cardiovascular: Negative for chest pain and leg swelling.  Gastrointestinal: Negative for abdominal distention, abdominal pain, constipation and diarrhea.  Endocrine: Negative.   Genitourinary: Negative for dysuria.  Musculoskeletal: Negative for joint swelling and myalgias.  Skin: Negative for rash.  Allergic/Immunologic: Negative.   Neurological: Negative for weakness, light-headedness and numbness.  Psychiatric/Behavioral: Negative for dysphoric mood and suicidal ideas.    Objective:  BP (!) 160/110 (BP Location: Left Arm, Patient Position: Sitting, Cuff Size: Large)   Pulse 77   Temp (!) 95.5 F (35.3 C) (Temporal)   Ht '5\' 10"'$  (1.778 m)   Wt 224 lb 12.8 oz (102 kg)   SpO2 92%   BMI 32.26 kg/m   BP/Weight 07/10/2019 05/28/2019 40/98/1191  Systolic BP 478 295 621  Diastolic BP 308 657 97  Wt. (  Lbs) 224.8 231 214.8  BMI 32.26 33.15 30.82      Physical Exam Constitutional:      Appearance: He is well-developed.  Neck:     Vascular: No JVD.  Cardiovascular:     Rate and Rhythm: Normal rate.     Heart sounds: Normal heart sounds. No murmur.  Pulmonary:     Effort: Pulmonary effort is normal.     Breath sounds: Normal breath sounds. No wheezing or rales.  Chest:     Chest wall: No tenderness.  Abdominal:     General: Bowel sounds are normal. There is no distension.     Palpations: Abdomen is soft. There is no mass.     Tenderness: There is no abdominal tenderness.  Musculoskeletal:        General: Normal range of motion.      Right lower leg: No edema.     Left lower leg: No edema.  Neurological:     Mental Status: He is alert and oriented to person, place, and time.  Psychiatric:        Mood and Affect: Mood normal.     CMP Latest Ref Rng & Units 02/19/2019 10/22/2018 07/10/2017  Glucose 65 - 99 mg/dL 112(H) 178(H) 82  BUN 6 - 24 mg/dL 21 26(H) 28(H)  Creatinine 0.76 - 1.27 mg/dL 1.76(H) 2.12(H) 1.79(H)  Sodium 134 - 144 mmol/L 142 145(H) 141  Potassium 3.5 - 5.2 mmol/L 4.2 4.2 4.5  Chloride 96 - 106 mmol/L 104 103 102  CO2 20 - 29 mmol/L '23 24 24  '$ Calcium 8.7 - 10.2 mg/dL 9.5 10.0 9.1  Total Protein 6.0 - 8.5 g/dL 7.9 7.9 -  Total Bilirubin 0.0 - 1.2 mg/dL 0.6 1.0 -  Alkaline Phos 39 - 117 IU/L 95 77 -  AST 0 - 40 IU/L 21 20 -  ALT 0 - 44 IU/L 30 19 -    Lipid Panel     Component Value Date/Time   CHOL 191 02/19/2019 1011   TRIG 84 02/19/2019 1011   HDL 63 02/19/2019 1011   CHOLHDL 3.0 02/19/2019 1011   LDLCALC 113 (H) 02/19/2019 1011    CBC    Component Value Date/Time   WBC 7.2 02/19/2019 1011   WBC 9.5 11/09/2013 1145   RBC 4.02 (L) 02/19/2019 1011   RBC 4.43 11/09/2013 1145   HGB 11.4 (L) 02/19/2019 1011   HCT 34.7 (L) 02/19/2019 1011   PLT 288 02/19/2019 1011   MCV 86 02/19/2019 1011   MCH 28.4 02/19/2019 1011   MCH 29.8 11/09/2013 1145   MCHC 32.9 02/19/2019 1011   MCHC 33.3 11/09/2013 1145   RDW 17.3 (H) 02/19/2019 1011   LYMPHSABS 2.3 02/19/2019 1011   MONOABS 0.6 11/09/2013 1145   EOSABS 0.1 02/19/2019 1011   BASOSABS 0.0 02/19/2019 1011    Lab Results  Component Value Date   HGBA1C 6.9 (A) 05/28/2019    Assessment & Plan:  1. Type 2 diabetes mellitus without complication, without long-term current use of insulin (HCC) Controlled with A1c of 6.9 Continue Metformin Counseled on Diabetic diet, my plate method, 921 minutes of moderate intensity exercise/week Blood sugar logs with fasting goals of 80-120 mg/dl, random of less than 180 and in the event of sugars less  than 60 mg/dl or greater than 400 mg/dl encouraged to notify the clinic. Advised on the need for annual eye exams, annual foot exams, Pneumonia vaccine. - Glucose (CBG), Fasting  2. Hypertensive urgency Clonidine 0.'1mg'$  administered  Advised to comply with antihypertensives and implications of non compliance discussed He iwll return at next visit for BP evaluation and has been advised to take his medication prior to appointment No regimen changes today. Counseled on blood pressure goal of less than 130/80, low-sodium, DASH diet, medication compliance, 150 minutes of moderate intensity exercise per week. Discussed medication compliance, adverse effects. - CMP14+EGFR - Lipid panel - cloNIDine (CATAPRES) tablet 0.1 mg  3. Non compliance w medication regimen See #2 above   Meds ordered this encounter  Medications  . cloNIDine (CATAPRES) tablet 0.1 mg    Follow-up: Return in about 2 weeks (around 07/24/2019) for follow up on Hypertension.       Charlott Rakes, MD, FAAFP. [facility] and Ada Chauncey, [place]   07/10/2019, 3:13 PM

## 2019-07-10 NOTE — Patient Instructions (Signed)

## 2019-07-11 LAB — CMP14+EGFR
ALT: 28 IU/L (ref 0–44)
AST: 29 IU/L (ref 0–40)
Albumin/Globulin Ratio: 1.1 — ABNORMAL LOW (ref 1.2–2.2)
Albumin: 3.8 g/dL (ref 3.8–4.9)
Alkaline Phosphatase: 86 IU/L (ref 39–117)
BUN/Creatinine Ratio: 11 (ref 9–20)
BUN: 21 mg/dL (ref 6–24)
Bilirubin Total: 0.5 mg/dL (ref 0.0–1.2)
CO2: 25 mmol/L (ref 20–29)
Calcium: 9.4 mg/dL (ref 8.7–10.2)
Chloride: 106 mmol/L (ref 96–106)
Creatinine, Ser: 1.83 mg/dL — ABNORMAL HIGH (ref 0.76–1.27)
GFR calc Af Amer: 46 mL/min/{1.73_m2} — ABNORMAL LOW (ref >59)
GFR calc non Af Amer: 40 mL/min/{1.73_m2} — ABNORMAL LOW (ref >59)
Globulin, Total: 3.6 g/dL (ref 1.5–4.5)
Glucose: 157 mg/dL — ABNORMAL HIGH (ref 65–99)
Potassium: 4.1 mmol/L (ref 3.5–5.2)
Sodium: 143 mmol/L (ref 134–144)
Total Protein: 7.4 g/dL (ref 6.0–8.5)

## 2019-07-11 LAB — LIPID PANEL
Chol/HDL Ratio: 2.4 ratio (ref 0.0–5.0)
Cholesterol, Total: 125 mg/dL (ref 100–199)
HDL: 53 mg/dL (ref >39)
LDL Chol Calc (NIH): 51 mg/dL (ref 0–99)
Triglycerides: 121 mg/dL (ref 0–149)
VLDL Cholesterol Cal: 21 mg/dL (ref 5–40)

## 2019-07-14 ENCOUNTER — Telehealth (INDEPENDENT_AMBULATORY_CARE_PROVIDER_SITE_OTHER): Payer: Self-pay

## 2019-07-14 NOTE — Telephone Encounter (Signed)
-----   Message from Hoy Register, MD sent at 07/13/2019  8:03 AM EDT ----- Can you please ensure he received the MyChart message regarding his labs?  Thank you

## 2019-07-14 NOTE — Telephone Encounter (Signed)
Patient verified date of birth. He is aware that Your cholesterol is normal, liver function is normal but kidney function is slightly abnormal. This could be due to the effect of uncontrolled hypertension on your kidneys. Please avoid medications like ibuprofen and Aleve as it could worsen your kidneys. Your kidney function will be monitored by your PCP. He verbalized understanding. Maryjean Morn, CMA

## 2019-07-27 ENCOUNTER — Encounter (INDEPENDENT_AMBULATORY_CARE_PROVIDER_SITE_OTHER): Payer: Self-pay | Admitting: Primary Care

## 2019-07-27 ENCOUNTER — Other Ambulatory Visit: Payer: Self-pay

## 2019-07-27 ENCOUNTER — Ambulatory Visit (INDEPENDENT_AMBULATORY_CARE_PROVIDER_SITE_OTHER): Payer: Self-pay | Admitting: Primary Care

## 2019-07-27 DIAGNOSIS — I10 Essential (primary) hypertension: Secondary | ICD-10-CM

## 2019-07-27 MED ORDER — AMLODIPINE BESYLATE 10 MG PO TABS: 10.0000 mg | ORAL_TABLET | Freq: Every day | ORAL | 1 refills | Status: DC

## 2019-07-27 MED ORDER — CHLORTHALIDONE 50 MG PO TABS: 50.0000 mg | ORAL_TABLET | Freq: Every day | ORAL | 1 refills | Status: DC

## 2019-07-27 MED ORDER — CARVEDILOL 6.25 MG PO TABS: 6.2500 mg | ORAL_TABLET | Freq: Two times a day (BID) | ORAL | 1 refills | Status: DC

## 2019-07-27 MED ORDER — LOSARTAN POTASSIUM 100 MG PO TABS: 100.0000 mg | ORAL_TABLET | Freq: Every day | ORAL | 1 refills | Status: DC

## 2019-07-27 NOTE — Progress Notes (Signed)
Presents for  hypertension evaluation, on previous visit medication was adjusted today to add diuretic chlorthalidone 50mg .  Patient is adherence with medications.  Current Medication List Current Outpatient Medications on File Prior to Visit  Medication Sig Dispense Refill  . allopurinol (ZYLOPRIM) 100 MG tablet Take 1 tablet (100 mg total) by mouth daily. 90 tablet 1  . atorvastatin (LIPITOR) 20 MG tablet Take 1 tablet (20 mg total) by mouth daily. 90 tablet 1  . Iron, Ferrous Sulfate, 325 (65 Fe) MG TABS Take 325 mg by mouth daily. 30 tablet 11  . metFORMIN (GLUCOPHAGE) 1000 MG tablet Take 1 tablet (1,000 mg total) by mouth 2 (two) times daily with a meal. 180 tablet 1  . senna (SENOKOT) 8.6 MG TABS tablet Take 1 tablet (8.6 mg total) by mouth daily. 120 tablet 0  . sildenafil (VIAGRA) 100 MG tablet Take 0.5 tablets (50 mg total) by mouth daily as needed for erectile dysfunction. (Patient not taking: Reported on 07/10/2019) 5 tablet 3   No current facility-administered medications on file prior to visit.   Past Medical History  Past Medical History:  Diagnosis Date  . Dizziness   . Gout   . Hypertension    Dietary habits include: eat 1 meal day decrease appetite and watching sodium Exercise habits include:no  Family / Social history: no  ASCVD risk factors include- [place] CHA2DS2/VAS Stroke Risk Points      N/A >= 2 Points: High Risk  1 - 1.99 Points: Medium Risk  0 Points: Low Risk    A final score could not be computed because of missing components.: Last  Change: N/A     This score determines the patient's risk of having a stroke if the  patient has atrial fibrillation.      This score is not applicable to this patient. Components are not  calculated.    O:  Physical Exam Vitals reviewed.  Constitutional:      Appearance: He is obese.  HENT:     Nose: Nose normal.  Cardiovascular:     Rate and Rhythm: Normal rate and regular rhythm.  Pulmonary:     Effort:  Pulmonary effort is normal.     Breath sounds: Normal breath sounds.  Abdominal:     General: Bowel sounds are normal.  Musculoskeletal:        General: Normal range of motion.     Cervical back: Normal range of motion and neck supple.  Skin:    General: Skin is warm and dry.  Neurological:     General: No focal deficit present.     Mental Status: He is alert and oriented to person, place, and time.  Psychiatric:        Mood and Affect: Mood normal.        Behavior: Behavior normal.        Thought Content: Thought content normal.        Judgment: Judgment normal.      Review of Systems  Gastrointestinal: Positive for nausea.       After drinking the day   All other systems reviewed and are negative.   Last 3 Office BP readings: BP Readings from Last 3 Encounters:  07/27/19 (!) 160/104  07/10/19 (!) 160/110  05/28/19 (!) 203/103    BMET    Component Value Date/Time   NA 143 07/10/2019 1126   K 4.1 07/10/2019 1126   CL 106 07/10/2019 1126   CO2 25 07/10/2019 1126   GLUCOSE  157 (H) 07/10/2019 1126   GLUCOSE 113 (H) 11/09/2013 1145   BUN 21 07/10/2019 1126   CREATININE 1.83 (H) 07/10/2019 1126   CALCIUM 9.4 07/10/2019 1126   GFRNONAA 40 (L) 07/10/2019 1126   GFRAA 46 (L) 07/10/2019 1126    Renal function: Estimated Creatinine Clearance: 53.1 mL/min (A) (by C-G formula based on SCr of 1.83 mg/dL (H)).  Clinical ASCVD: Yes  The ASCVD Risk score Denman George DC Jr., et al., 2013) failed to calculate for the following reasons:   The valid total cholesterol range is 130 to 320 mg/dL   A/P: Hypertension, unspecified type Hypertension longstanding  on current medications. BP Goa1 130/80 mmHg. Patient is adherent with current medications.  -Started chlorthalidone 50 mg daily -F/u labs ordered - previous review  -Counseled on lifestyle modifications for blood pressure control including reduced dietary sodium, increased exercise, adequate sleep

## 2019-07-27 NOTE — Patient Instructions (Addendum)
Counseled on blood pressure goal of less than 130/80, low-sodium, DASH diet, medication compliance, 150 minutes of moderate intensity exercise per week. Discussed medication compliance, adverse effects. Continue all antihypertensives as prescribed.  Remember to bring in your blood pressure log with you for your follow up appointment.  DASH/Mediterranean Diets are healthier choices for HTN.

## 2019-08-31 ENCOUNTER — Ambulatory Visit (INDEPENDENT_AMBULATORY_CARE_PROVIDER_SITE_OTHER): Payer: Self-pay | Admitting: Primary Care

## 2019-08-31 ENCOUNTER — Other Ambulatory Visit: Payer: Self-pay

## 2019-08-31 VITALS — BP 155/120 | HR 114 | Temp 97.3°F | Resp 16

## 2019-08-31 DIAGNOSIS — F1020 Alcohol dependence, uncomplicated: Secondary | ICD-10-CM

## 2019-08-31 DIAGNOSIS — I10 Essential (primary) hypertension: Secondary | ICD-10-CM

## 2019-08-31 DIAGNOSIS — E119 Type 2 diabetes mellitus without complications: Secondary | ICD-10-CM

## 2019-08-31 LAB — POCT GLYCOSYLATED HEMOGLOBIN (HGB A1C): Hemoglobin A1C: 6.7 % — AB (ref 4.0–5.6)

## 2019-08-31 LAB — POCT CBG (FASTING - GLUCOSE)-MANUAL ENTRY: Glucose Fasting, POC: 160 mg/dL — AB (ref 70–99)

## 2019-08-31 NOTE — Progress Notes (Signed)
Unsure of the names of the medication he takes Advised to bring medications when he comes in.

## 2019-08-31 NOTE — Progress Notes (Signed)
Subjective:  Patient ID: Tony Dean, male    DOB: 1961/01/27  Age: 59 y.o. MRN: [id number]  CC: No chief complaint on file.   HPI [name] presents forFollow-up of diabetes. Patient does not check blood sugar at home  Compliant with meds - Yes Checking CBGs? No  Fasting avg -   Postprandial average -  Exercising regularly? - No Watching carbohydrate intake? - No Neuropathy ? - No Hypoglycemic events - No  - Recovers with :   Pertinent ROS:  Polyuria - Yes Polydipsia - No Vision problems - No  Medications as noted below. Taking them regularly without complication/adverse reaction being reported today.   History Calistro has a past medical history of Dizziness, Gout, and Hypertension.   He has a past surgical history that includes Appendectomy and Inguinal hernia repair (1990's).   His family history is not on file.He reports that he has never smoked. He has never used smokeless tobacco. He reports current alcohol use of about 22.0 - 24.0 standard drinks of alcohol per week. He reports that he does not use drugs.  Current Outpatient Medications on File Prior to Visit  Medication Sig Dispense Refill  . allopurinol (ZYLOPRIM) 100 MG tablet Take 1 tablet (100 mg total) by mouth daily. 90 tablet 1  . amLODipine (NORVASC) 10 MG tablet Take 1 tablet (10 mg total) by mouth daily. 90 tablet 1  . atorvastatin (LIPITOR) 20 MG tablet Take 1 tablet (20 mg total) by mouth daily. 90 tablet 1  . carvedilol (COREG) 6.25 MG tablet Take 1 tablet (6.25 mg total) by mouth 2 (two) times daily with a meal. 180 tablet 1  . chlorthalidone (HYGROTON) 50 MG tablet Take 1 tablet (50 mg total) by mouth daily. 90 tablet 1  . Iron, Ferrous Sulfate, 325 (65 Fe) MG TABS Take 325 mg by mouth daily. 30 tablet 11  . losartan (COZAAR) 100 MG tablet Take 1 tablet (100 mg total) by mouth daily. 90 tablet 1  . metFORMIN (GLUCOPHAGE) 1000 MG tablet Take 1 tablet (1,000 mg total) by mouth 2 (two) times  daily with a meal. 180 tablet 1  . senna (SENOKOT) 8.6 MG TABS tablet Take 1 tablet (8.6 mg total) by mouth daily. 120 tablet 0  . sildenafil (VIAGRA) 100 MG tablet Take 0.5 tablets (50 mg total) by mouth daily as needed for erectile dysfunction. (Patient not taking: Reported on 07/10/2019) 5 tablet 3   No current facility-administered medications on file prior to visit.    ROS Review of Systems  Gastrointestinal: Positive for vomiting.  All other systems reviewed and are negative.   Objective:  BP (!) 155/120   Pulse (!) 114   Temp (!) 97.3 F (36.3 C)   Resp 16   SpO2 93%   BP Readings from Last 3 Encounters:  08/31/19 (!) 155/120  07/27/19 (!) 160/104  07/10/19 (!) 160/110    Wt Readings from Last 3 Encounters:  07/27/19 228 lb 9.6 oz (103.7 kg)  07/10/19 224 lb 12.8 oz (102 kg)  05/28/19 231 lb (104.8 kg)    Physical Exam Vitals reviewed.  Constitutional:      Appearance: He is obese.  Cardiovascular:     Rate and Rhythm: Normal rate and regular rhythm.  Pulmonary:     Effort: Pulmonary effort is normal.     Breath sounds: Normal breath sounds.  Abdominal:     General: Bowel sounds are normal.  Musculoskeletal:        General:  Normal range of motion.     Cervical back: Normal range of motion and neck supple.  Skin:    General: Skin is warm and dry.  Neurological:     Mental Status: He is alert and oriented to person, place, and time.  Psychiatric:        Mood and Affect: Mood normal.        Behavior: Behavior normal.        Thought Content: Thought content normal.        Judgment: Judgment normal.     Lab Results  Component Value Date   HGBA1C 6.7 (A) 08/31/2019   HGBA1C 6.9 (A) 05/28/2019   HGBA1C 6.8 (H) 10/22/2018    Lab Results  Component Value Date   WBC 7.2 02/19/2019   HGB 11.4 (L) 02/19/2019   HCT 34.7 (L) 02/19/2019   PLT 288 02/19/2019   GLUCOSE 157 (H) 07/10/2019   CHOL 125 07/10/2019   TRIG 121 07/10/2019   HDL 53 07/10/2019    LDLCALC 51 07/10/2019   ALT 28 07/10/2019   AST 29 07/10/2019   NA 143 07/10/2019   K 4.1 07/10/2019   CL 106 07/10/2019   CREATININE 1.83 (H) 07/10/2019   BUN 21 07/10/2019   CO2 25 07/10/2019   TSH 0.895 07/10/2017   HGBA1C 6.7 (A) 08/31/2019     Assessment & Plan:   Diagnoses and all orders for this visit:  Type 2 diabetes mellitus without complication, without long-term current use of insulin (HCC)  Therapeutic goals for glycemic control related to A1c measurements: Goal of therapy: Less than 6.5 hemoglobin A1c.   Foods that are high in carbohydrates are the following rice, potatoes, breads, sugars, and pastas.  Reduction in the intake (eating) will assist in lowering your blood sugars. -     HgB A1c 6.7 -     Glucose (CBG), Fasting  Alcoholism (HCC) Continues to drink alcohol and complains of nausea and vomiting explain I will entertain this behavior and conversation at each visit other than STOP DRINKING  Hypertension, unspecified type Patient dis not take medication this morning . Elevated 155/120 as soon as patient gets home     I am having Clement Husbands maintain his sildenafil stopped until compliant with Bp medication , Iron (Ferrous Sulfate), senna, metFORMIN, allopurinol, atorvastatin, carvedilol, losartan, amLODipine, and chlorthalidone.  No orders of the defined types were placed in this encounter.    Follow-up:   Return in about 4 weeks (around 09/28/2019) for tel Bp check .  The above assessment and management plan was discussed with the patient. The patient verbalized understanding of and has agreed to the management plan. Patient is aware to call the clinic if symptoms fail to improve or worsen. Patient is aware when to return to the clinic for a follow-up visit. Patient educated on when it is appropriate to go to the emergency department.   Gwinda Passe, NP-C

## 2019-08-31 NOTE — Patient Instructions (Signed)
   Managing Your Hypertension Hypertension is commonly called high blood pressure. This is when the force of your blood pressing against the walls of your arteries is too strong. Arteries are blood vessels that carry blood from your heart throughout your body. Hypertension forces the heart to work harder to pump blood, and may cause the arteries to become narrow or stiff. Having untreated or uncontrolled hypertension can cause heart attack, stroke, kidney disease, and other problems. What are blood pressure readings? A blood pressure reading consists of a higher number over a lower number. Ideally, your blood pressure should be below 120/80. The first ("top") number is called the systolic pressure. It is a measure of the pressure in your arteries as your heart beats. The second ("bottom") number is called the diastolic pressure. It is a measure of the pressure in your arteries as the heart relaxes. What does my blood pressure reading mean? Blood pressure is classified into four stages. Based on your blood pressure reading, your health care provider may use the following stages to determine what type of treatment you need, if any. Systolic pressure and diastolic pressure are measured in a unit called mm Hg. Normal  Systolic pressure: below 120.  Diastolic pressure: below 80. Elevated  Systolic pressure: 120-129.  Diastolic pressure: below 80. Hypertension stage 1  Systolic pressure: 130-139.  Diastolic pressure: 80-89. Hypertension stage 2  Systolic pressure: 140 or above.  Diastolic pressure: 90 or above. What health risks are associated with hypertension? Managing your hypertension is an important responsibility. Uncontrolled hypertension can lead to:  A heart attack.  A stroke.  A weakened blood vessel (aneurysm).  Heart failure.  Kidney damage.  Eye damage.  Metabolic syndrome.  Memory and concentration problems. What changes can I make to manage my  hypertension? Hypertension can be managed by making lifestyle changes and possibly by taking medicines. Your health care provider will help you make a plan to bring your blood pressure within a normal range. Eating and drinking   Eat a diet that is high in fiber and potassium, and low in salt (sodium), added sugar, and fat. An example eating plan is called the DASH (Dietary Approaches to Stop Hypertension) diet. To eat this way: ? Eat plenty of fresh fruits and vegetables. Try to fill half of your plate at each meal with fruits and vegetables. ? Eat whole grains, such as whole wheat pasta, brown rice, or whole grain bread. Fill about one quarter of your plate with whole grains. ? Eat low-fat diary products. ? Avoid fatty cuts of meat, processed or cured meats, and poultry with skin. Fill about one quarter of your plate with lean proteins such as fish, chicken without skin, beans, eggs, and tofu. ? Avoid premade and processed foods. These tend to be higher in sodium, added sugar, and fat.  Reduce your daily sodium intake. Most people with hypertension should eat less than 1,500 mg of sodium a day.  Limit alcohol intake to no more than 1 drink a day for nonpregnant women and 2 drinks a day for men. One drink equals 12 oz of beer, 5 oz of wine, or 1 oz of hard liquor. Lifestyle  Work with your health care provider to maintain a healthy body weight, or to lose weight. Ask what an ideal weight is for you.  Get at least 30 minutes of exercise that causes your heart to beat faster (aerobic exercise) most days of the week. Activities may include walking, swimming, or biking.    Include exercise to strengthen your muscles (resistance exercise), such as weight lifting, as part of your weekly exercise routine. Try to do these types of exercises for 30 minutes at least 3 days a week.  Do not use any products that contain nicotine or tobacco, such as cigarettes and e-cigarettes. If you need help quitting,  ask your health care provider.  Control any long-term (chronic) conditions you have, such as high cholesterol or diabetes. Monitoring  Monitor your blood pressure at home as told by your health care provider. Your personal target blood pressure may vary depending on your medical conditions, your age, and other factors.  Have your blood pressure checked regularly, as often as told by your health care provider. Working with your health care provider  Review all the medicines you take with your health care provider because there may be side effects or interactions.  Talk with your health care provider about your diet, exercise habits, and other lifestyle factors that may be contributing to hypertension.  Visit your health care provider regularly. Your health care provider can help you create and adjust your plan for managing hypertension. Will I need medicine to control my blood pressure? Your health care provider may prescribe medicine if lifestyle changes are not enough to get your blood pressure under control, and if:  Your systolic blood pressure is 130 or higher.  Your diastolic blood pressure is 80 or higher. Take medicines only as told by your health care provider. Follow the directions carefully. Blood pressure medicines must be taken as prescribed. The medicine does not work as well when you skip doses. Skipping doses also puts you at risk for problems. Contact a health care provider if:  You think you are having a reaction to medicines you have taken.  You have repeated (recurrent) headaches.  You feel dizzy.  You have swelling in your ankles.  You have trouble with your vision. Get help right away if:  You develop a severe headache or confusion.  You have unusual weakness or numbness, or you feel faint.  You have severe pain in your chest or abdomen.  You vomit repeatedly.  You have trouble breathing. Summary  Hypertension is when the force of blood pumping  through your arteries is too strong. If this condition is not controlled, it may put you at risk for serious complications.  Your personal target blood pressure may vary depending on your medical conditions, your age, and other factors. For most people, a normal blood pressure is less than 120/80.  Hypertension is managed by lifestyle changes, medicines, or both. Lifestyle changes include weight loss, eating a healthy, low-sodium diet, exercising more, and limiting alcohol. This information is not intended to replace advice given to you by your health care provider. Make sure you discuss any questions you have with your health care provider. Document Revised: 08/01/2018 Document Reviewed: 03/07/2016 Elsevier Patient Education  2020 [employer]  

## 2019-10-01 ENCOUNTER — Telehealth (INDEPENDENT_AMBULATORY_CARE_PROVIDER_SITE_OTHER): Payer: Self-pay | Admitting: Primary Care

## 2020-03-11 ENCOUNTER — Encounter (HOSPITAL_COMMUNITY): Payer: Self-pay

## 2020-03-11 ENCOUNTER — Ambulatory Visit (HOSPITAL_COMMUNITY)
Admission: EM | Admit: 2020-03-11 | Discharge: 2020-03-11 | Disposition: A | Discharge status: Home / Self Care | Payer: BC Managed Care – PPO | Attending: Family Medicine | Admitting: Family Medicine

## 2020-03-11 ENCOUNTER — Other Ambulatory Visit: Payer: Self-pay

## 2020-03-11 DIAGNOSIS — K852 Alcohol induced acute pancreatitis without necrosis or infection: Secondary | ICD-10-CM | POA: Insufficient documentation

## 2020-03-11 DIAGNOSIS — Z20822 Contact with and (suspected) exposure to covid-19: Secondary | ICD-10-CM | POA: Diagnosis not present

## 2020-03-11 DIAGNOSIS — E860 Dehydration: Secondary | ICD-10-CM | POA: Diagnosis present

## 2020-03-11 DIAGNOSIS — R197 Diarrhea, unspecified: Secondary | ICD-10-CM | POA: Diagnosis not present

## 2020-03-11 DIAGNOSIS — R109 Unspecified abdominal pain: Secondary | ICD-10-CM | POA: Diagnosis not present

## 2020-03-11 DIAGNOSIS — R1110 Vomiting, unspecified: Secondary | ICD-10-CM

## 2020-03-11 DIAGNOSIS — N179 Acute kidney failure, unspecified: Secondary | ICD-10-CM | POA: Diagnosis present

## 2020-03-11 LAB — CBC WITH DIFFERENTIAL/PLATELET
Abs Immature Granulocytes: 0.03 10*3/uL (ref 0.00–0.07)
Basophils Absolute: 0.1 10*3/uL (ref 0.0–0.1)
Basophils Relative: 1 %
Eosinophils Absolute: 0.2 10*3/uL (ref 0.0–0.5)
Eosinophils Relative: 2 %
HCT: 44.2 % (ref 39.0–52.0)
Hemoglobin: 14.7 g/dL (ref 13.0–17.0)
Immature Granulocytes: 0 %
Lymphocytes Relative: 23 %
Lymphs Abs: 1.9 10*3/uL (ref 0.7–4.0)
MCH: 31.1 pg (ref 26.0–34.0)
MCHC: 33.3 g/dL (ref 30.0–36.0)
MCV: 93.4 fL (ref 80.0–100.0)
Monocytes Absolute: 0.7 10*3/uL (ref 0.1–1.0)
Monocytes Relative: 8 %
Neutro Abs: 5.2 10*3/uL (ref 1.7–7.7)
Neutrophils Relative %: 66 %
Platelets: 236 10*3/uL (ref 150–400)
RBC: 4.73 MIL/uL (ref 4.22–5.81)
RDW: 13.4 % (ref 11.5–15.5)
WBC: 7.9 10*3/uL (ref 4.0–10.5)
nRBC: 0 % (ref 0.0–0.2)

## 2020-03-11 LAB — COMPREHENSIVE METABOLIC PANEL
ALT: 14 U/L (ref 0–44)
AST: 17 U/L (ref 15–41)
Albumin: 3.7 g/dL (ref 3.5–5.0)
Alkaline Phosphatase: 81 U/L (ref 38–126)
Anion gap: 14 (ref 5–15)
BUN: 26 mg/dL — ABNORMAL HIGH (ref 6–20)
CO2: 24 mmol/L (ref 22–32)
Calcium: 9.7 mg/dL (ref 8.9–10.3)
Chloride: 100 mmol/L (ref 98–111)
Creatinine, Ser: 2.45 mg/dL — ABNORMAL HIGH (ref 0.61–1.24)
GFR, Estimated: 30 mL/min — ABNORMAL LOW (ref >60)
Glucose, Bld: 126 mg/dL — ABNORMAL HIGH (ref 70–99)
Potassium: 4.2 mmol/L (ref 3.5–5.1)
Sodium: 138 mmol/L (ref 135–145)
Total Bilirubin: 2.1 mg/dL — ABNORMAL HIGH (ref 0.3–1.2)
Total Protein: 8.8 g/dL — ABNORMAL HIGH (ref 6.5–8.1)

## 2020-03-11 LAB — POCT URINALYSIS DIPSTICK, ED / UC
Glucose, UA: NEGATIVE mg/dL
Ketones, ur: 15 mg/dL — AB
Leukocytes,Ua: NEGATIVE
Nitrite: NEGATIVE
Protein, ur: >=300 mg/dL — AB
Specific Gravity, Urine: >=1.03 (ref 1.005–1.030)
Urobilinogen, UA: 1 mg/dL (ref 0.0–1.0)
pH: 5.5 (ref 5.0–8.0)

## 2020-03-11 LAB — RESPIRATORY PANEL BY RT PCR (FLU A&B, COVID)
Influenza A by PCR: NEGATIVE
Influenza B by PCR: NEGATIVE
SARS Coronavirus 2 by RT PCR: NEGATIVE

## 2020-03-11 LAB — AMYLASE: Amylase: 169 U/L — ABNORMAL HIGH (ref 28–100)

## 2020-03-11 LAB — LIPASE, BLOOD: Lipase: 161 U/L — ABNORMAL HIGH (ref 11–51)

## 2020-03-11 MED ORDER — SODIUM CHLORIDE 0.9 % IV BOLUS
1000.0000 mL | Freq: Once | INTRAVENOUS | Status: AC
  Administered 2020-03-11: 1000 mL via INTRAVENOUS

## 2020-03-11 MED ORDER — ONDANSETRON 4 MG PO TBDP: 4.0000 mg | ORAL_TABLET | Freq: Three times a day (TID) | ORAL | 0 refills | Status: DC | PRN

## 2020-03-11 NOTE — Discharge Instructions (Addendum)
I believe your symptoms are related to pancreatitis. We have rehydrated you here today.  Continue to drink fluids at home.  Zofran for nausea as needed Recommend not drinking any alcohol. If your symptoms continue or worsen you need to go to the hospital.

## 2020-03-11 NOTE — ED Triage Notes (Signed)
Pt in with c/o abdominal pain, headache, N/V, diarrhea that has been going on for  3 days now. Also c/o congestion  Pt took tylenol with minimal relief  Denies runny nose, cough

## 2020-03-11 NOTE — ED Provider Notes (Signed)
[facility]    CSN: [id number] Arrival date & time: 03/11/20  8413      History   Chief Complaint Chief Complaint  Patient presents with   Abdominal Pain   Emesis   Diarrhea    HPI Tony Dean is a 59 y.o. male.   Pt is a 59 year old male that presents today with abdominal pain, headache, nausea, vomiting, diarrhea.  Is been going on for approximate 3 days.  Also has indigestion.  Pain is in the upper abdominal area.  Took Tylenol with minimal relief.  Has not been able to really eat or drink much.  Denies any runny nose or cough.  Reports he does drink alcohol.  Approximately 3 drinks a day. 1 shot and 3 beers. Has not had any alcohol since this started. No specific withdrawal symptoms.      Past Medical History:  Diagnosis Date   Dizziness    Gout    Hypertension     There are no problems to display for this patient.   Past Surgical History:  Procedure Laterality Date   APPENDECTOMY     INGUINAL HERNIA REPAIR  1990's   right       Home Medications    Prior to Admission medications   Medication Sig Start Date End Date Taking? Authorizing Provider  allopurinol (ZYLOPRIM) 100 MG tablet Take 1 tablet (100 mg total) by mouth daily. 05/28/19  Yes Grayce Sessions, NP  amLODipine (NORVASC) 10 MG tablet Take 1 tablet (10 mg total) by mouth daily. 07/27/19  Yes Grayce Sessions, NP  atorvastatin (LIPITOR) 20 MG tablet Take 1 tablet (20 mg total) by mouth daily. 05/28/19  Yes Grayce Sessions, NP  metFORMIN (GLUCOPHAGE) 1000 MG tablet Take 1 tablet (1,000 mg total) by mouth 2 (two) times daily with a meal. 05/28/19  Yes Grayce Sessions, NP  carvedilol (COREG) 6.25 MG tablet Take 1 tablet (6.25 mg total) by mouth 2 (two) times daily with a meal. 07/27/19   Grayce Sessions, NP  chlorthalidone (HYGROTON) 50 MG tablet Take 1 tablet (50 mg total) by mouth daily. 07/27/19   Grayce Sessions, NP  Iron, Ferrous Sulfate, 325 (65 Fe) MG TABS Take  325 mg by mouth daily. 02/22/19   Grayce Sessions, NP  losartan (COZAAR) 100 MG tablet Take 1 tablet (100 mg total) by mouth daily. 07/27/19   Grayce Sessions, NP  ondansetron (ZOFRAN ODT) 4 MG disintegrating tablet Take 1 tablet (4 mg total) by mouth every 8 (eight) hours as needed for nausea or vomiting. 03/11/20   Dahlia Byes A, NP  senna (SENOKOT) 8.6 MG TABS tablet Take 1 tablet (8.6 mg total) by mouth daily. 05/28/19   Grayce Sessions, NP  sildenafil (VIAGRA) 100 MG tablet Take 0.5 tablets (50 mg total) by mouth daily as needed for erectile dysfunction. Patient not taking: Reported on 07/10/2019 11/19/18   Grayce Sessions, NP    Family History Family History  Problem Relation Age of Onset   Healthy Mother    Hypertension Father    Colon cancer Neg Hx     Social History Social History   Tobacco Use   Smoking status: Never Smoker   Smokeless tobacco: Never Used  Substance Use Topics   Alcohol use: Yes    Alcohol/week: 22.0 - 24.0 standard drinks    Types: 14 Cans of beer, 8 - 10 Shots of liquor per week   Drug use: No  Allergies   Lisinopril   Review of Systems Review of Systems   Physical Exam Triage Vital Signs ED Triage Vitals  Enc Vitals Group     BP 03/11/20 0939 (!) 122/96     Pulse Rate 03/11/20 0939 (!) 109     Resp 03/11/20 0939 20     Temp 03/11/20 0939 98 F (36.7 C)     Temp Source 03/11/20 0939 Oral     SpO2 03/11/20 0939 95 %     Weight --      Height --      Head Circumference --      Peak Flow --      Pain Score 03/11/20 0936 8     Pain Loc --      Pain Edu? --      Excl. in GC? --    No data found.  Updated Vital Signs BP (!) 122/96 (BP Location: Left Arm)    Pulse (!) 109    Temp 98 F (36.7 C) (Oral)    Resp 20    SpO2 95%   Visual Acuity Right Eye Distance:   Left Eye Distance:   Bilateral Distance:    Right Eye Near:   Left Eye Near:    Bilateral Near:     Physical Exam Vitals and nursing note  reviewed.  Constitutional:      General: He is not in acute distress.    Appearance: Normal appearance. He is not ill-appearing, toxic-appearing or diaphoretic.  HENT:     Head: Normocephalic and atraumatic.     Nose: Nose normal.  Eyes:     Conjunctiva/sclera: Conjunctivae normal.  Cardiovascular:     Rate and Rhythm: Normal rate and regular rhythm.     Heart sounds: Normal heart sounds.  Pulmonary:     Effort: Pulmonary effort is normal.     Breath sounds: Normal breath sounds.  Abdominal:     Palpations: Abdomen is soft.     Tenderness: There is abdominal tenderness in the right upper quadrant and left upper quadrant.  Musculoskeletal:        General: Normal range of motion.     Cervical back: Normal range of motion.  Skin:    General: Skin is warm and dry.  Neurological:     Mental Status: He is alert.  Psychiatric:        Mood and Affect: Mood normal.      UC Treatments / Results  Labs (all labs ordered are listed, but only abnormal results are displayed) Labs Reviewed  COMPREHENSIVE METABOLIC PANEL - Abnormal; Notable for the following components:      Result Value   Glucose, Bld 126 (*)    BUN 26 (*)    Creatinine, Ser 2.45 (*)    Total Protein 8.8 (*)    Total Bilirubin 2.1 (*)    GFR, Estimated 30 (*)    All other components within normal limits  LIPASE, BLOOD - Abnormal; Notable for the following components:   Lipase 161 (*)    All other components within normal limits  AMYLASE - Abnormal; Notable for the following components:   Amylase 169 (*)    All other components within normal limits  POCT URINALYSIS DIPSTICK, ED / UC - Abnormal; Notable for the following components:   Bilirubin Urine MODERATE (*)    Ketones, ur 15 (*)    Hgb urine dipstick SMALL (*)    Protein, ur >=300 (*)    All other  components within normal limits  RESPIRATORY PANEL BY RT PCR (FLU A&B, COVID)  CBC WITH DIFFERENTIAL/PLATELET    EKG   Radiology No results  found.  Procedures Procedures (including critical care time)  Medications Ordered in UC Medications  sodium chloride 0.9 % bolus 1,000 mL (0 mLs Intravenous Stopped 03/11/20 1354)    Initial Impression / Assessment and Plan / UC Course  I have reviewed the triage vital signs and the nursing notes.  Pertinent labs & imaging results that were available during my care of the patient were reviewed by me and considered in my medical decision making (see chart for details).     upper abdominal discomfort, nausea, vomiting, diarrhea. Also has some respiratory symptoms.  This may be something viral. Urine with small hemoglobin, greater than 300 protein, bilirubin and ketones. Pt appears to be dehydrated. He is tachy 109. Feels weak.  We will draw some blood work and give patient 1 L of normal saline to rehydrate. Awaiting labs and then discharge if no concerns.  Differentials include gastroenteritis versus pancreatitis versus GERD versus Covid  Lab work with elevated lipase and amylase. Creatinine 2.45 and BUN 26. GFR 30   Believe patient symptoms are related to pancreatitis and dehydration. Patient was rehydrated here with normal saline bolus.  Reporting pain improved and feeling better. Do not believe he needs to go  To the ER at this time.  Will have him rest, drink plenty of fluids. Advance diet as tolerated. No alcohol.  zofran as needed.  Recheck labs with PCP next week.    covid and flu test pending.        Final Clinical Impressions(s) / UC Diagnoses   Final diagnoses:  Dehydration  Alcohol-induced acute pancreatitis, unspecified complication status  AKI (acute kidney injury) [facility])     Discharge Instructions     I believe your symptoms are related to pancreatitis. We have rehydrated you here today.  Continue to drink fluids at home.  Zofran for nausea as needed Recommend not drinking any alcohol. If your symptoms continue or worsen you need to go to the  hospital.    ED Prescriptions    Medication Sig Dispense Auth. Provider   ondansetron (ZOFRAN ODT) 4 MG disintegrating tablet Take 1 tablet (4 mg total) by mouth every 8 (eight) hours as needed for nausea or vomiting. 20 tablet Dahlia Byes A, NP     PDMP not reviewed this encounter.   Janace Aris, NP 03/11/20 1415

## 2020-05-12 ENCOUNTER — Other Ambulatory Visit: Payer: Self-pay

## 2020-05-12 ENCOUNTER — Encounter (INDEPENDENT_AMBULATORY_CARE_PROVIDER_SITE_OTHER): Payer: Self-pay

## 2020-05-12 ENCOUNTER — Ambulatory Visit (INDEPENDENT_AMBULATORY_CARE_PROVIDER_SITE_OTHER): Payer: BC Managed Care – PPO | Admitting: Physician Assistant

## 2020-05-12 VITALS — BP 187/130 | HR 81 | Temp 98.7°F | Resp 18 | Ht 70.0 in | Wt 210.0 lb

## 2020-05-12 DIAGNOSIS — Z9114 Patient's other noncompliance with medication regimen: Secondary | ICD-10-CM

## 2020-05-12 DIAGNOSIS — N521 Erectile dysfunction due to diseases classified elsewhere: Secondary | ICD-10-CM

## 2020-05-12 DIAGNOSIS — G629 Polyneuropathy, unspecified: Secondary | ICD-10-CM

## 2020-05-12 DIAGNOSIS — F1020 Alcohol dependence, uncomplicated: Secondary | ICD-10-CM

## 2020-05-12 DIAGNOSIS — Z125 Encounter for screening for malignant neoplasm of prostate: Secondary | ICD-10-CM

## 2020-05-12 DIAGNOSIS — E790 Hyperuricemia without signs of inflammatory arthritis and tophaceous disease: Secondary | ICD-10-CM | POA: Diagnosis not present

## 2020-05-12 DIAGNOSIS — I10 Essential (primary) hypertension: Secondary | ICD-10-CM

## 2020-05-12 DIAGNOSIS — E7841 Elevated Lipoprotein(a): Secondary | ICD-10-CM

## 2020-05-12 DIAGNOSIS — D509 Iron deficiency anemia, unspecified: Secondary | ICD-10-CM

## 2020-05-12 DIAGNOSIS — Z8739 Personal history of other diseases of the musculoskeletal system and connective tissue: Secondary | ICD-10-CM

## 2020-05-12 DIAGNOSIS — E119 Type 2 diabetes mellitus without complications: Secondary | ICD-10-CM | POA: Diagnosis not present

## 2020-05-12 DIAGNOSIS — K5909 Other constipation: Secondary | ICD-10-CM | POA: Insufficient documentation

## 2020-05-12 MED ORDER — ALLOPURINOL 100 MG PO TABS: 100.0000 mg | ORAL_TABLET | Freq: Every day | ORAL | 1 refills | Status: DC

## 2020-05-12 MED ORDER — CHLORTHALIDONE 50 MG PO TABS: 50.0000 mg | ORAL_TABLET | Freq: Every day | ORAL | 1 refills | Status: DC

## 2020-05-12 MED ORDER — METFORMIN HCL 1000 MG PO TABS: 1000.0000 mg | ORAL_TABLET | Freq: Two times a day (BID) | ORAL | 1 refills | Status: DC

## 2020-05-12 MED ORDER — SILDENAFIL CITRATE 100 MG PO TABS: 50.0000 mg | ORAL_TABLET | Freq: Every day | ORAL | 3 refills | Status: DC | PRN

## 2020-05-12 MED ORDER — CARVEDILOL 6.25 MG PO TABS: 6.2500 mg | ORAL_TABLET | Freq: Two times a day (BID) | ORAL | 1 refills | Status: DC

## 2020-05-12 MED ORDER — ATORVASTATIN CALCIUM 20 MG PO TABS: 20.0000 mg | ORAL_TABLET | Freq: Every day | ORAL | 1 refills | Status: DC

## 2020-05-12 MED ORDER — SENNA 8.6 MG PO TABS: 1.0000 | ORAL_TABLET | Freq: Every day | ORAL | 0 refills | Status: DC

## 2020-05-12 MED ORDER — LOSARTAN POTASSIUM 100 MG PO TABS: 100.0000 mg | ORAL_TABLET | Freq: Every day | ORAL | 1 refills | Status: DC

## 2020-05-12 MED ORDER — AMLODIPINE BESYLATE 10 MG PO TABS: 10.0000 mg | ORAL_TABLET | Freq: Every day | ORAL | 1 refills | Status: DC

## 2020-05-12 NOTE — Patient Instructions (Signed)
I encourage you to restart all of your medications.  We will call you with your lab results.  I encourage you to greatly reduce your alcohol intake even further, you will still be able to find ways to have fun, I guarantee it.  Please let us know if there is anything else we can do for you  Roney Jaffe, PA-C Physician Assistant [facility] [email or web address]    Neuropathic Pain Neuropathic pain is pain caused by damage to the nerves that are responsible for certain sensations in your body (sensory nerves). The pain can be caused by:  Damage to the sensory nerves that send signals to your spinal cord and brain (peripheral nervous system).  Damage to the sensory nerves in your brain or spinal cord (central nervous system). Neuropathic pain can make you more sensitive to pain. Even a minor sensation can feel very painful. This is usually a long-term condition that can be difficult to treat. The type of pain differs from person to person. It may:  Start suddenly (acute), or it may develop slowly and last for a long time (chronic).  Come and go as damaged nerves heal, or it may stay at the same level for years.  Cause emotional distress, loss of sleep, and a lower quality of life. What are the causes? The most common cause of this condition is diabetes. Many other diseases and conditions can also cause neuropathic pain. Causes of neuropathic pain can be classified as:  Toxic. This is caused by medicines and chemicals. The most common cause of toxic neuropathic pain is damage from cancer treatments (chemotherapy).  Metabolic. This can be caused by: ? Diabetes. This is the most common disease that damages the nerves. ? Lack of vitamin B from long-term alcohol abuse.  Traumatic. Any injury that cuts, crushes, or stretches a nerve can cause damage and pain. A common example is feeling pain after losing an arm or leg (phantom limb  pain).  Compression-related. If a sensory nerve gets trapped or compressed for a long period of time, the blood supply to the nerve can be cut off.  Vascular. Many blood vessel diseases can cause neuropathic pain by decreasing blood supply and oxygen to nerves.  Autoimmune. This type of pain results from diseases in which the body's defense system (immune system) mistakenly attacks sensory nerves. Examples of autoimmune diseases that can cause neuropathic pain include lupus and multiple sclerosis.  Infectious. Many types of viral infections can damage sensory nerves and cause pain. Shingles infection is a common cause of this type of pain.  Inherited. Neuropathic pain can be a symptom of many diseases that are passed down through families (genetic). What increases the risk? You are more likely to develop this condition if:  You have diabetes.  You smoke.  You drink too much alcohol.  You are taking certain medicines, including medicines that kill cancer cells (chemotherapy) or that treat immune system disorders. What are the signs or symptoms? The main symptom is pain. Neuropathic pain is often described as:  Burning.  Shock-like.  Stinging.  Hot or cold.  Itching. How is this diagnosed? No single test can diagnose neuropathic pain. It is diagnosed based on:  Physical exam and your symptoms. Your health care provider will ask you about your pain. You may be asked to use a pain scale to describe how bad your pain is.  Tests. These may be done to see if you have a high sensitivity to pain and to help  find the cause and location of any sensory nerve damage. They include: ? Nerve conduction studies to test how well nerve signals travel through your sensory nerves (electrodiagnostic testing). ? Stimulating your sensory nerves through electrodes on your skin and measuring the response in your spinal cord and brain (somatosensory evoked potential).  Imaging studies, such  as: ? X-rays. ? CT scan. ? MRI. How is this treated? Treatment for neuropathic pain may change over time. You may need to try different treatment options or a combination of treatments. Some options include:  Treating the underlying cause of the neuropathy, such as diabetes, kidney disease, or vitamin deficiencies.  Stopping medicines that can cause neuropathy, such as chemotherapy.  Medicine to relieve pain. Medicines may include: ? Prescription or over-the-counter pain medicine. ? Anti-seizure medicine. ? Antidepressant medicines. ? Pain-relieving patches that are applied to painful areas of skin. ? A medicine to numb the area (local anesthetic), which can be injected as a nerve block.  Transcutaneous nerve stimulation. This uses electrical currents to block painful nerve signals. The treatment is painless.  Alternative treatments, such as: ? Acupuncture. ? Meditation. ? Massage. ? Physical therapy. ? Pain management programs. ? Counseling. Follow these instructions at home: Medicines  Take over-the-counter and prescription medicines only as told by your health care provider.  Do not drive or use heavy machinery while taking prescription pain medicine.  If you are taking prescription pain medicine, take actions to prevent or treat constipation. Your health care provider may recommend that you: ? Drink enough fluid to keep your urine pale yellow. ? Eat foods that are high in fiber, such as fresh fruits and vegetables, whole grains, and beans. ? Limit foods that are high in fat and processed sugars, such as fried or sweet foods. ? Take an over-the-counter or prescription medicine for constipation.   Lifestyle  Have a good support system at home.  Consider joining a chronic pain support group.  Do not use any products that contain nicotine or tobacco, such as cigarettes and e-cigarettes. If you need help quitting, ask your health care provider.  Do not drink alcohol.    General instructions  Learn as much as you can about your condition.  Work closely with all your health care providers to find the treatment plan that works best for you.  Ask your health care provider what activities are safe for you.  Keep all follow-up visits as told by your health care provider. This is important. Contact a health care provider if:  Your pain treatments are not working.  You are having side effects from your medicines.  You are struggling with tiredness (fatigue), mood changes, depression, or anxiety. Summary  Neuropathic pain is pain caused by damage to the nerves that are responsible for certain sensations in your body (sensory nerves).  Neuropathic pain may come and go as damaged nerves heal, or it may stay at the same level for years.  Neuropathic pain is usually a long-term condition that can be difficult to treat. Consider joining a chronic pain support group. This information is not intended to replace advice given to you by your health care provider. Make sure you discuss any questions you have with your health care provider. Document Revised: 07/31/2018 Document Reviewed: 04/26/2017 Elsevier Patient Education  2021 [employer].

## 2020-05-12 NOTE — Progress Notes (Signed)
Established Patient Office Visit  Subjective:  Patient ID: Tony Dean, male    DOB: 03-31-1961  Age: 60 y.o. MRN: [id number]  CC:  Chief Complaint  Patient presents with  . Diabetes  . Hypertension    HPI Tony Dean presents for medication mgmt.  States that he has been out of his medications for the past month.    Reports that he does not check his blood glucose levels at home, states that he does not have a glucometer.  States that he has been having bilateral foot pain, reports it as tingling, pins-and-needles, states that it is worse at the end of the day.  Has not tried anything for relief.  States this has been present for the past couple of months.  Denies injury or trauma  States that he does not check blood pressure at home, does state that he does have a blood.  Denies hypertensive symptoms.  Reports that he has been working on reducing his alcohol consumption, states that he is drinking approximately 3-4 times a week.  Does endorse that he was seen at urgent care in November 2021 for right upper quadrant pain, was found to have elevated lipase levels and elevated bilirubin.  Reports that the pain resolved on its own, did not receive care in the emergency department.     Past Medical History:  Diagnosis Date  . Dizziness   . Gout   . Hypertension     Past Surgical History:  Procedure Laterality Date  . APPENDECTOMY    . INGUINAL HERNIA REPAIR  1990's   right    Family History  Problem Relation Age of Onset  . Healthy Mother   . Hypertension Father   . Colon cancer Neg Hx     Social History   Socioeconomic History  . Marital status: Legally Separated    Spouse name: Not on file  . Number of children: Not on file  . Years of education: Not on file  . Highest education level: Not on file  Occupational History  . Not on file  Tobacco Use  . Smoking status: Never Smoker  . Smokeless tobacco: Never Used  Substance and Sexual Activity  .  Alcohol use: Yes    Alcohol/week: 22.0 - 24.0 standard drinks    Types: 14 Cans of beer, 8 - 10 Shots of liquor per week  . Drug use: No  . Sexual activity: Not Currently  Other Topics Concern  . Not on file  Social History Narrative  . Not on file   Social Determinants of Health   Financial Resource Strain: Not on file  Food Insecurity: Not on file  Transportation Needs: Not on file  Physical Activity: Not on file  Stress: Not on file  Social Connections: Not on file  Intimate Partner Violence: Not on file    Outpatient Medications Prior to Visit  Medication Sig Dispense Refill  . Iron, Ferrous Sulfate, 325 (65 Fe) MG TABS Take 325 mg by mouth daily. 30 tablet 11  . ondansetron (ZOFRAN ODT) 4 MG disintegrating tablet Take 1 tablet (4 mg total) by mouth every 8 (eight) hours as needed for nausea or vomiting. 20 tablet 0  . allopurinol (ZYLOPRIM) 100 MG tablet Take 1 tablet (100 mg total) by mouth daily. 90 tablet 1  . amLODipine (NORVASC) 10 MG tablet Take 1 tablet (10 mg total) by mouth daily. 90 tablet 1  . atorvastatin (LIPITOR) 20 MG tablet Take 1 tablet (20 mg total) by  mouth daily. 90 tablet 1  . carvedilol (COREG) 6.25 MG tablet Take 1 tablet (6.25 mg total) by mouth 2 (two) times daily with a meal. 180 tablet 1  . chlorthalidone (HYGROTON) 50 MG tablet Take 1 tablet (50 mg total) by mouth daily. 90 tablet 1  . losartan (COZAAR) 100 MG tablet Take 1 tablet (100 mg total) by mouth daily. 90 tablet 1  . metFORMIN (GLUCOPHAGE) 1000 MG tablet Take 1 tablet (1,000 mg total) by mouth 2 (two) times daily with a meal. 180 tablet 1  . senna (SENOKOT) 8.6 MG TABS tablet Take 1 tablet (8.6 mg total) by mouth daily. 120 tablet 0  . sildenafil (VIAGRA) 100 MG tablet Take 0.5 tablets (50 mg total) by mouth daily as needed for erectile dysfunction. 5 tablet 3   No facility-administered medications prior to visit.    Allergies  Allergen Reactions  . Lisinopril Cough    ROS Review  of Systems  Constitutional: Negative for chills, fatigue and fever.  HENT: Negative.   Eyes: Negative.   Respiratory: Negative for cough, chest tightness, shortness of breath and wheezing.   Cardiovascular: Negative for chest pain and palpitations.  Gastrointestinal: Negative for abdominal pain.  Endocrine: Negative.   Genitourinary: Negative.   Musculoskeletal: Negative for joint swelling.  Skin: Negative.   Allergic/Immunologic: Negative.   Neurological: Negative for dizziness, seizures, weakness and headaches.  Hematological: Negative.   Psychiatric/Behavioral: Negative.       Objective:    Physical Exam Vitals and nursing note reviewed.  Constitutional:      General: He is not in acute distress.    Appearance: Normal appearance. He is not ill-appearing.  HENT:     Head: Normocephalic and atraumatic.     Right Ear: External ear normal.     Left Ear: External ear normal.     Nose: Nose normal.     Mouth/Throat:     Mouth: Mucous membranes are moist.     Pharynx: Oropharynx is clear.  Eyes:     Extraocular Movements: Extraocular movements intact.     Conjunctiva/sclera: Conjunctivae normal.     Pupils: Pupils are equal, round, and reactive to light.  Cardiovascular:     Rate and Rhythm: Normal rate and regular rhythm.     Pulses: Normal pulses.          Dorsalis pedis pulses are 2+ on the right side and 2+ on the left side.       Posterior tibial pulses are 2+ on the right side and 2+ on the left side.     Heart sounds: Normal heart sounds.  Pulmonary:     Effort: Pulmonary effort is normal.     Breath sounds: Normal breath sounds.  Abdominal:     General: Abdomen is flat.     Palpations: Abdomen is soft.     Tenderness: There is no abdominal tenderness.  Musculoskeletal:        General: Normal range of motion.     Cervical back: Normal range of motion and neck supple.     Right lower leg: No edema.     Left lower leg: No edema.  Skin:    General: Skin is warm  and dry.  Neurological:     General: No focal deficit present.     Mental Status: He is alert and oriented to person, place, and time. Mental status is at baseline.  Psychiatric:        Mood and Affect: Mood normal.  Behavior: Behavior normal.        Thought Content: Thought content normal.        Judgment: Judgment normal.     BP (!) 187/130 (BP Location: Right Arm, Patient Position: Sitting, Cuff Size: Normal)   Pulse 81   Temp 98.7 F (37.1 C) (Oral)   Resp 18   Ht 5\' 10"  (1.778 m)   Wt 210 lb (95.3 kg)   SpO2 99%   BMI 30.13 kg/m  Wt Readings from Last 3 Encounters:  05/12/20 210 lb (95.3 kg)  07/27/19 228 lb 9.6 oz (103.7 kg)  07/10/19 224 lb 12.8 oz (102 kg)     Health Maintenance Due  Topic Date Due  . COVID-19 Vaccine (1) Never done  . COLONOSCOPY (Pts [id number] Insurance coverage will need to be confirmed)  02/08/2018  . COLON CANCER SCREENING ANNUAL FOBT  10/23/2019  . INFLUENZA VACCINE  11/22/2019    There are no preventive care reminders to display for this patient.  Lab Results  Component Value Date   TSH 0.895 07/10/2017   Lab Results  Component Value Date   WBC 7.9 03/11/2020   HGB 14.7 03/11/2020   HCT 44.2 03/11/2020   MCV 93.4 03/11/2020   PLT 236 03/11/2020   Lab Results  Component Value Date   NA 138 03/11/2020   K 4.2 03/11/2020   CO2 24 03/11/2020   GLUCOSE 126 (H) 03/11/2020   BUN 26 (H) 03/11/2020   CREATININE 2.45 (H) 03/11/2020   BILITOT 2.1 (H) 03/11/2020   ALKPHOS 81 03/11/2020   AST 17 03/11/2020   ALT 14 03/11/2020   PROT 8.8 (H) 03/11/2020   ALBUMIN 3.7 03/11/2020   CALCIUM 9.7 03/11/2020   ANIONGAP 14 03/11/2020   Lab Results  Component Value Date   CHOL 125 07/10/2019   Lab Results  Component Value Date   HDL 53 07/10/2019   Lab Results  Component Value Date   LDLCALC 51 07/10/2019   Lab Results  Component Value Date   TRIG 121 07/10/2019   Lab Results  Component Value Date   CHOLHDL 2.4  07/10/2019   Lab Results  Component Value Date   HGBA1C 6.7 (A) 08/31/2019      Assessment & Plan:   Problem List Items Addressed This Visit   None   Visit Diagnoses    Type 2 diabetes mellitus without complication, without long-term current use of insulin (HCC)    -  Primary   Relevant Medications   metFORMIN (GLUCOPHAGE) 1000 MG tablet   atorvastatin (LIPITOR) 20 MG tablet   losartan (COZAAR) 100 MG tablet   Other Relevant Orders   CBC with Differential/Platelet   Comp. Metabolic Panel (12)   Microalbumin / creatinine urine ratio   Hyperuricemia       Relevant Medications   allopurinol (ZYLOPRIM) 100 MG tablet   Other Relevant Orders   Uric Acid   Hypertension, unspecified type       Relevant Medications   amLODipine (NORVASC) 10 MG tablet   atorvastatin (LIPITOR) 20 MG tablet   carvedilol (COREG) 6.25 MG tablet   chlorthalidone (HYGROTON) 50 MG tablet   losartan (COZAAR) 100 MG tablet   sildenafil (VIAGRA) 100 MG tablet   Other Relevant Orders   TSH   Elevated lipoprotein(a)       Relevant Medications   atorvastatin (LIPITOR) 20 MG tablet   Other Relevant Orders   Lipid panel   Non compliance w medication regimen  Alcoholism (HCC)       Prostate cancer screening       Relevant Orders   PSA   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 metFORMIN (GLUCOPHAGE) 1000 MG tablet    Sig: Take 1 tablet (1,000 mg total) by mouth 2 (two) times daily with a meal.    Dispense:  180 tablet    Refill:  1    Order Specific Question:   Supervising Provider    Answer:   Delford FieldWRIGHT, PATRICK E [[id number]]  . allopurinol (ZYLOPRIM) 100 MG  tablet    Sig: Take 1 tablet (100 mg total) by mouth daily.    Dispense:  90 tablet    Refill:  1    Order Specific Question:   Supervising Provider    Answer:   Delford FieldWRIGHT, PATRICK E [[id number]]  . amLODipine (NORVASC) 10 MG tablet    Sig: Take 1 tablet (10 mg total) by mouth daily.    Dispense:  90 tablet    Refill:  1    Order Specific Question:   Supervising Provider    Answer:   Delford FieldWRIGHT, PATRICK E [[id number]]  . atorvastatin (LIPITOR) 20 MG tablet    Sig: Take 1 tablet (20 mg total) by mouth daily.    Dispense:  90 tablet    Refill:  1    Order Specific Question:   Supervising Provider    Answer:   Delford FieldWRIGHT, PATRICK E [[id number]]  . carvedilol (COREG) 6.25 MG tablet    Sig: Take 1 tablet (6.25 mg total) by mouth 2 (two) times daily with a meal.    Dispense:  180 tablet    Refill:  1    Order Specific Question:   Supervising Provider    Answer:   Delford FieldWRIGHT, PATRICK E [[id number]]  . chlorthalidone (HYGROTON) 50 MG tablet    Sig: Take 1 tablet (50 mg total) by mouth daily.    Dispense:  90 tablet    Refill:  1    Order Specific Question:   Supervising Provider    Answer:   Delford FieldWRIGHT, PATRICK E [[id number]]  . losartan (COZAAR) 100 MG tablet    Sig: Take 1 tablet (100 mg total) by mouth daily.    Dispense:  90 tablet    Refill:  1    Order Specific Question:   Supervising Provider    Answer:   Delford FieldWRIGHT, PATRICK E [[id number]]  . senna (SENOKOT) 8.6 MG TABS tablet    Sig: Take 1 tablet (8.6 mg total) by mouth daily.    Dispense:  120 tablet    Refill:  0    Order Specific Question:   Supervising Provider    Answer:   Delford FieldWRIGHT, PATRICK E [[id number]]  . sildenafil (VIAGRA) 100 MG tablet    Sig: Take 0.5 tablets (50 mg total) by mouth daily as needed for erectile dysfunction.    Dispense:  5 tablet    Refill:  3    Order Specific Question:   Supervising Provider  Answer:   Shan Levans E [[id number]]  1. Hyperuricemia Resume current regimen - allopurinol (ZYLOPRIM) 100 MG tablet; Take 1 tablet (100 mg total) by mouth daily.   Dispense: 90 tablet; Refill: 1 - Uric Acid  2. Hypertension, unspecified type Resume current medication, encourage patient to check blood pressure on a daily basis, keep a written log this visit - TSH - amLODipine (NORVASC) 10 MG tablet; Take 1 tablet (10 mg total) by mouth daily.  Dispense: 90 tablet; Refill: 1 - carvedilol (COREG) 6.25 MG tablet; Take 1 tablet (6.25 mg total) by mouth 2 (two) times daily with a meal.  Dispense: 180 tablet; Refill: 1 - chlorthalidone (HYGROTON) 50 MG tablet; Take 1 tablet (50 mg total) by mouth daily.  Dispense: 90 tablet; Refill: 1 - losartan (COZAAR) 100 MG tablet; Take 1 tablet (100 mg total) by mouth daily.  Dispense: 90 tablet; Refill: 1  3. Elevated lipoprotein(a) Resume current regimen - Lipid panel - atorvastatin (LIPITOR) 20 MG tablet; Take 1 tablet (20 mg total) by mouth daily.  Dispense: 90 tablet; Refill: 1  4. Type 2 diabetes mellitus without complication, without long-term current use of insulin (HCC) Resume current regimen, patient refuses prescription for glucometer - CBC with Differential/Platelet - Comp. Metabolic Panel (12) - Microalbumin / creatinine urine ratio - metFORMIN (GLUCOPHAGE) 1000 MG tablet; Take 1 tablet (1,000 mg total) by mouth 2 (two) times daily with a meal.  Dispense: 180 tablet; Refill: 1  5. Non compliance w medication regimen Patient education given on importance of compliance.  Patient did not endorse that he recently received health insurance and will be easier for him to become compliant does have desire for compliance  6. Alcoholism [facility]) Patient education given on benefits of discontinuing alcohol at this time, consider liver imaging based on lab results  7. Prostate cancer screening  - PSA  8. Erectile disorder due to medical condition in male Continue current regimen - sildenafil (VIAGRA) 100 MG tablet; Take 0.5 tablets (50 mg total) by mouth daily as needed for erectile dysfunction.  Dispense: 5  tablet; Refill: 3  9. History of gout   10. Iron deficiency anemia, unspecified iron deficiency anemia type We will refill iron based on lab results - CBC with Differential/Platelet - Iron, TIBC and Ferritin Panel  11. Other constipation   12. Neuropathy Patient education given, encouraged to work on lowering blood glucose levels, alcohol intake   I have reviewed the patient's medical history (PMH, PSH, Social History, Family History, Medications, and allergies) , and have been updated if relevant. I spent 30 minutes reviewing chart and  face to face time with patient.      Follow-up: Return in about 3 months (around 08/10/2020).    Kasandra Knudsen Lachandra Dettmann, PA-C

## 2020-05-12 NOTE — Progress Notes (Signed)
Patient has not eaten or taken medication today. Patient complains of bilateral foot pain after working all day. Patient reports he has been out of BP medication for 1 month and denies HA, Dizziness or Nausea. CBG: 96 A1C: 6.6

## 2020-05-13 LAB — COMP. METABOLIC PANEL (12)
AST: 13 IU/L (ref 0–40)
Albumin/Globulin Ratio: 1.1 — ABNORMAL LOW (ref 1.2–2.2)
Albumin: 4.1 g/dL (ref 3.8–4.9)
Alkaline Phosphatase: 96 IU/L (ref 44–121)
BUN/Creatinine Ratio: 13 (ref 9–20)
BUN: 23 mg/dL (ref 6–24)
Bilirubin Total: 0.4 mg/dL (ref 0.0–1.2)
Calcium: 9.2 mg/dL (ref 8.7–10.2)
Chloride: 103 mmol/L (ref 96–106)
Creatinine, Ser: 1.76 mg/dL — ABNORMAL HIGH (ref 0.76–1.27)
GFR calc Af Amer: 48 mL/min/{1.73_m2} — ABNORMAL LOW (ref >59)
GFR calc non Af Amer: 41 mL/min/{1.73_m2} — ABNORMAL LOW (ref >59)
Globulin, Total: 3.9 g/dL (ref 1.5–4.5)
Glucose: 113 mg/dL — ABNORMAL HIGH (ref 65–99)
Potassium: 4.4 mmol/L (ref 3.5–5.2)
Sodium: 143 mmol/L (ref 134–144)
Total Protein: 8 g/dL (ref 6.0–8.5)

## 2020-05-13 LAB — CBC WITH DIFFERENTIAL/PLATELET
Basophils Absolute: 0.1 10*3/uL (ref 0.0–0.2)
Basos: 1 %
EOS (ABSOLUTE): 0.1 10*3/uL (ref 0.0–0.4)
Eos: 1 %
Hematocrit: 38.6 % (ref 37.5–51.0)
Hemoglobin: 13.3 g/dL (ref 13.0–17.7)
Immature Grans (Abs): 0 10*3/uL (ref 0.0–0.1)
Immature Granulocytes: 0 %
Lymphocytes Absolute: 2.5 10*3/uL (ref 0.7–3.1)
Lymphs: 35 %
MCH: 30.4 pg (ref 26.6–33.0)
MCHC: 34.5 g/dL (ref 31.5–35.7)
MCV: 88 fL (ref 79–97)
Monocytes Absolute: 0.7 10*3/uL (ref 0.1–0.9)
Monocytes: 9 %
Neutrophils Absolute: 3.9 10*3/uL (ref 1.4–7.0)
Neutrophils: 54 %
Platelets: 306 10*3/uL (ref 150–450)
RBC: 4.37 x10E6/uL (ref 4.14–5.80)
RDW: 13.2 % (ref 11.6–15.4)
WBC: 7.2 10*3/uL (ref 3.4–10.8)

## 2020-05-13 LAB — MICROALBUMIN / CREATININE URINE RATIO
Creatinine, Urine: 178.8 mg/dL
Microalb/Creat Ratio: 166 mg/g creat — ABNORMAL HIGH (ref 0–29)
Microalbumin, Urine: 297.7 ug/mL

## 2020-05-13 LAB — URIC ACID: Uric Acid: 10.9 mg/dL — ABNORMAL HIGH (ref 3.8–8.4)

## 2020-05-13 LAB — TSH: TSH: 1.76 u[IU]/mL (ref 0.450–4.500)

## 2020-05-13 LAB — LIPID PANEL
Chol/HDL Ratio: 2.9 ratio (ref 0.0–5.0)
Cholesterol, Total: 200 mg/dL — ABNORMAL HIGH (ref 100–199)
HDL: 69 mg/dL (ref >39)
LDL Chol Calc (NIH): 116 mg/dL — ABNORMAL HIGH (ref 0–99)
Triglycerides: 83 mg/dL (ref 0–149)
VLDL Cholesterol Cal: 15 mg/dL (ref 5–40)

## 2020-05-13 LAB — IRON,TIBC AND FERRITIN PANEL
Ferritin: 220 ng/mL (ref 30–400)
Iron Saturation: 24 % (ref 15–55)
Iron: 60 ug/dL (ref 38–169)
Total Iron Binding Capacity: 253 ug/dL (ref 250–450)
UIBC: 193 ug/dL (ref 111–343)

## 2020-05-13 LAB — PSA: Prostate Specific Ag, Serum: 0.6 ng/mL (ref 0.0–4.0)

## 2020-05-17 ENCOUNTER — Telehealth: Payer: Self-pay | Admitting: *Deleted

## 2020-05-17 NOTE — Telephone Encounter (Signed)
-----   Message from Roney Jaffe, [place] sent at 05/16/2020  2:43 PM EST ----- Patient and let him know that his labs showed a decrease in his kidney function liver function was within normal limits.  His thyroid is within normal limits and he does not show signs of anemia.  His screening for prostate cancer was negative.  His uric acid is elevated, this should resolve when he resumes and is compliant to his medications , his kidney function should improve with resumption and compliance to medications as well.  His cholesterol is elevated, his overall risk of a cardiovascular event in the next 10 years is 38.9% it is imperative that he resumes his cholesterol medication and is compliant to it as well as following a low-cholesterol diet  The 10-year ASCVD risk score Denman George DC Montez Hageman., et al., 2013) is: 38.9%   Values used to calculate the score:     Age: 60 years     Sex: Male     Is Non-Hispanic African American: Yes     Diabetic: Yes     Tobacco smoker: No     Systolic Blood Pressure: 187 mmHg     Is BP treated: Yes     HDL Cholesterol: 69 mg/dL     Total Cholesterol: 200 mg/dL

## 2020-05-17 NOTE — Telephone Encounter (Signed)
Patient verified DOB Patient is aware of labs being normal except for cholesterol and kidney function. Patient advised to adhere to medication compliance to assist his kidneys in regulating. Patient also aware of needing to follow a low cholesterol diet and medication. No further questions at this time.

## 2020-05-23 ENCOUNTER — Ambulatory Visit (HOSPITAL_COMMUNITY)
Admission: EM | Admit: 2020-05-23 | Discharge: 2020-05-23 | Disposition: A | Discharge status: Home / Self Care | Payer: BC Managed Care – PPO | Attending: Internal Medicine | Admitting: Internal Medicine

## 2020-05-23 ENCOUNTER — Other Ambulatory Visit: Payer: Self-pay

## 2020-05-23 ENCOUNTER — Ambulatory Visit (INDEPENDENT_AMBULATORY_CARE_PROVIDER_SITE_OTHER): Payer: BC Managed Care – PPO

## 2020-05-23 ENCOUNTER — Encounter (HOSPITAL_COMMUNITY): Payer: Self-pay

## 2020-05-23 DIAGNOSIS — M79642 Pain in left hand: Secondary | ICD-10-CM

## 2020-05-23 DIAGNOSIS — R600 Localized edema: Secondary | ICD-10-CM

## 2020-05-23 DIAGNOSIS — S62102A Fracture of unspecified carpal bone, left wrist, initial encounter for closed fracture: Secondary | ICD-10-CM

## 2020-05-23 DIAGNOSIS — W000XXA Fall on same level due to ice and snow, initial encounter: Secondary | ICD-10-CM | POA: Diagnosis not present

## 2020-05-23 NOTE — ED Notes (Signed)
Pt states has a arm splint at home that he would like to use instead of new one given by UC.

## 2020-05-23 NOTE — Discharge Instructions (Addendum)
Schedule an appointment with orthopedic specialist within the week,  Follow up with urgent care if pain worsens after splint placement  OTC NSAIDS and tylenol can be alternated in use for pain management

## 2020-05-23 NOTE — ED Triage Notes (Addendum)
Pt in with c/o left hand numbness and pain that started about 4 days ago. Also c/o slurred speech and blurry vision in both eyes. States that he fell on ice last week and landed on his hands but the pain did not start until days later  Pt has been using warm and cold compress with no relief  Pt unable to grip with left hand, moderate grip with right

## 2020-05-23 NOTE — ED Provider Notes (Addendum)
[facility]    CSN: [id number] Arrival date & time: 05/23/20  2549      History   Chief Complaint Chief Complaint  Patient presents with  . Numbness  . Hand Pain    HPI Tony Dean is a 60 y.o. male.   Patient c/o of left wrist swelling and pain at 8/10 starting Friday. Describes as sharp and radiating to upper arm. Episodes of lightheadness, blurred vision and dizziness when pain at worse. No ROM at wrist. No ROM of digits. No numbness, no tingling,  Had fall last week, unable to recall landing position. No prior injury. Attempted use of heat and cold measures with no relief.   Past Medical History:  Diagnosis Date  . Dizziness   . Gout   . Hypertension     Patient Active Problem List   Diagnosis Date Noted  . Hyperuricemia 05/12/2020  . Hypertension 05/12/2020  . 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 Type 2 diabetes mellitus without complication, without long-term current use of insulin (HCC) 05/12/2020  . Non compliance w medication regimen 05/12/2020  . Alcoholism (HCC) 05/12/2020  . 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 History of gout 05/12/2020  . Other constipation 05/12/2020  . Neuropathy 05/12/2020    Past Surgical History:  Procedure Laterality Date  . APPENDECTOMY    . INGUINAL HERNIA REPAIR  1990's   right       Home Medications    Prior to Admission medications   Medication Sig Start Date End Date Taking? Authorizing Provider  allopurinol (ZYLOPRIM) 100 MG tablet Take 1 tablet (100 mg total) by mouth daily. 05/12/20   Mayers, Cari S, PA-C  amLODipine (NORVASC) 10 MG tablet Take 1 tablet (10 mg total) by mouth daily. 05/12/20   Mayers, Cari S, PA-C  atorvastatin (LIPITOR) 20 MG tablet Take 1 tablet (20 mg total) by mouth daily. 05/12/20   Mayers, Cari S, PA-C  carvedilol (COREG) 6.25 MG tablet Take 1 tablet (6.25 mg total) by mouth 2 (two) times daily with a meal. 05/12/20   Mayers, Cari S, PA-C   chlorthalidone (HYGROTON) 50 MG tablet Take 1 tablet (50 mg total) by mouth daily. 05/12/20   Mayers, Cari S, PA-C  Iron, Ferrous Sulfate, 325 (65 Fe) MG TABS Take 325 mg by mouth daily. 02/22/19   Grayce Sessions, NP  losartan (COZAAR) 100 MG tablet Take 1 tablet (100 mg total) by mouth daily. 05/12/20   Mayers, Cari S, PA-C  metFORMIN (GLUCOPHAGE) 1000 MG tablet Take 1 tablet (1,000 mg total) by mouth 2 (two) times daily with a meal. 05/12/20   Mayers, Cari S, PA-C  ondansetron (ZOFRAN ODT) 4 MG disintegrating tablet Take 1 tablet (4 mg total) by mouth every 8 (eight) hours as needed for nausea or vomiting. 03/11/20   Dahlia Byes A, NP  senna (SENOKOT) 8.6 MG TABS tablet Take 1 tablet (8.6 mg total) by mouth daily. 05/12/20   Mayers, Cari S, PA-C  sildenafil (VIAGRA) 100 MG tablet Take 0.5 tablets (50 mg total) by mouth daily as needed for erectile dysfunction. 05/12/20   Mayers, Kasandra Knudsen, PA-C    Family History Family History  Problem Relation Age of Onset  . Healthy Mother   . Hypertension Father   . Colon cancer Neg Hx     Social History Social History   Tobacco Use  . Smoking status: Never Smoker  . Smokeless tobacco: Never Used  Substance Use Topics  . Alcohol  use: Yes    Alcohol/week: 22.0 - 24.0 standard drinks    Types: 14 Cans of beer, 8 - 10 Shots of liquor per week  . Drug use: No     Allergies   Lisinopril   Review of Systems Review of Systems  Constitutional: Negative.   Eyes: Positive for visual disturbance.  Skin: Negative.   Neurological: Positive for dizziness and light-headedness. Negative for tremors, seizures, syncope, facial asymmetry, speech difficulty, weakness, numbness and headaches.     Physical Exam Triage Vital Signs ED Triage Vitals  Enc Vitals Group     BP 05/23/20 1028 (!) 170/90     Pulse Rate 05/23/20 1028 86     Resp 05/23/20 1028 19     Temp 05/23/20 1028 98.4 F (36.9 C)     Temp src --      SpO2 05/23/20 1028 100 %     Weight  --      Height --      Head Circumference --      Peak Flow --      Pain Score 05/23/20 1025 8     Pain Loc --      Pain Edu? --      Excl. in GC? --    No data found.  Updated Vital Signs BP (!) 170/90   Pulse 86   Temp 98.4 F (36.9 C)   Resp 19   SpO2 100%   Visual Acuity Right Eye Distance:   Left Eye Distance:   Bilateral Distance:    Right Eye Near:   Left Eye Near:    Bilateral Near:     Physical Exam Constitutional:      Appearance: Normal appearance. He is normal weight.  HENT:     Head: Normocephalic.  Eyes:     Extraocular Movements: Extraocular movements intact.     Conjunctiva/sclera: Conjunctivae normal.     Pupils: Pupils are equal, round, and reactive to light.  Pulmonary:     Effort: Pulmonary effort is normal.  Musculoskeletal:        General: Swelling and tenderness present. No signs of injury.     Cervical back: Normal range of motion.     Comments: Active ROM not present in left wrist and digits, passive ROM of wrist elicits pain, swelling starts at mid forearm and encompasses entire hand   Skin:    General: Skin is warm and dry.  Neurological:     General: No focal deficit present.     Mental Status: He is alert and oriented to person, place, and time. Mental status is at baseline.     Cranial Nerves: No cranial nerve deficit.     Sensory: No sensory deficit.     Motor: No weakness.  Psychiatric:        Mood and Affect: Mood normal.        Behavior: Behavior normal.        Thought Content: Thought content normal.        Judgment: Judgment normal.      UC Treatments / Results  Labs (all labs ordered are listed, but only abnormal results are displayed) Labs Reviewed - No data to display  EKG   Radiology No results found.  Procedures Procedures (including critical care time)  Medications Ordered in UC Medications - No data to display  Initial Impression / Assessment and Plan / UC Course  I have reviewed the triage vital  signs and the nursing notes.  Pertinent labs & imaging results that were available during my care of the patient were reviewed by me and considered in my medical decision making (see chart for details).   Left wrist fracture Splint offered to patient. Declined, states" he has a splint at home"  referred to orthopedics, patient to schedule appointment, verbalized understanding, OTC tylenol and NSAIDS recommended for pain management.   BP of 170/90. Patient in pain and has not taken daily medication.  Final Clinical Impressions(s) / UC Diagnoses   Final diagnoses:  None   Discharge Instructions   None    ED Prescriptions    None     PDMP not reviewed this encounter.   Valinda Hoar, NP 05/23/20 1148    Valinda Hoar, [place] 05/23/20 [phone]

## 2020-08-10 ENCOUNTER — Ambulatory Visit (INDEPENDENT_AMBULATORY_CARE_PROVIDER_SITE_OTHER): Payer: Self-pay | Admitting: Primary Care

## 2020-08-12 ENCOUNTER — Encounter (INDEPENDENT_AMBULATORY_CARE_PROVIDER_SITE_OTHER): Payer: Self-pay | Admitting: Primary Care

## 2020-08-12 ENCOUNTER — Other Ambulatory Visit: Payer: Self-pay

## 2020-08-12 ENCOUNTER — Ambulatory Visit (INDEPENDENT_AMBULATORY_CARE_PROVIDER_SITE_OTHER): Payer: BC Managed Care – PPO | Admitting: Primary Care

## 2020-08-12 VITALS — BP 141/99 | HR 112 | Temp 97.5°F | Ht 70.0 in | Wt 205.0 lb

## 2020-08-12 DIAGNOSIS — E119 Type 2 diabetes mellitus without complications: Secondary | ICD-10-CM | POA: Diagnosis not present

## 2020-08-12 DIAGNOSIS — I10 Essential (primary) hypertension: Secondary | ICD-10-CM

## 2020-08-12 DIAGNOSIS — G629 Polyneuropathy, unspecified: Secondary | ICD-10-CM | POA: Diagnosis not present

## 2020-08-12 LAB — POCT GLYCOSYLATED HEMOGLOBIN (HGB A1C): Hemoglobin A1C: 6.2 % — AB (ref 4.0–5.6)

## 2020-08-12 MED ORDER — CARVEDILOL 6.25 MG PO TABS
6.2500 mg | ORAL_TABLET | Freq: Two times a day (BID) | ORAL | 1 refills | Status: DC
  Filled 2020-08-12: qty 60, 30d supply, fill #0

## 2020-08-12 MED ORDER — AMLODIPINE BESYLATE 10 MG PO TABS
10.0000 mg | ORAL_TABLET | Freq: Every day | ORAL | 1 refills | Status: DC
  Filled 2020-08-12: qty 30, 30d supply, fill #0

## 2020-08-12 MED ORDER — LOSARTAN POTASSIUM 100 MG PO TABS
100.0000 mg | ORAL_TABLET | Freq: Every day | ORAL | 1 refills | Status: DC
  Filled 2020-08-12: qty 30, 30d supply, fill #0

## 2020-08-12 MED ORDER — METFORMIN HCL ER 500 MG PO TB24
500.0000 mg | ORAL_TABLET | Freq: Every day | ORAL | 1 refills | Status: DC
  Filled 2020-08-12: qty 30, 30d supply, fill #0

## 2020-08-12 MED ORDER — CHLORTHALIDONE 50 MG PO TABS
50.0000 mg | ORAL_TABLET | Freq: Every day | ORAL | 1 refills | Status: DC
  Filled 2020-08-12: qty 30, 30d supply, fill #0

## 2020-08-12 NOTE — Progress Notes (Signed)
Subjective:  Patient ID: Tony Dean, male    DOB: May 30, 1960  Age: 60 y.o. MRN: [id number]  CC: Diabetes   HPI [name] presents forFollow-up of diabetes. Patient does not check blood sugar at home Compliant with meds - Yes Checking CBGs? No  Fasting avg -   Postprandial average -  Exercising regularly? - Yes Watching carbohydrate intake? - Yes Neuropathy ? - Yes Hypoglycemic events - No  - Recovers with :  Pertinent ROS:  Polyuria - Yes Polydipsia - No Vision problems - Yes Patient is also being seen for management of hypertension. Denies shortness of breath, headaches, chest pain or lower extremity edema, sudden onset, vision changes, unilateral weakness, dizziness, paresthesias.  Patient reports he did not take his blood pressure medication before appointment he had a sausage sandwich.  He also voiced concerns about having pain in the groin area.  Medications as noted below. Taking them regularly without complication/adverse reaction being reported today.   History Tony Dean has a past medical history of Dizziness, Gout, and Hypertension.   He has a past surgical history that includes Appendectomy and Inguinal hernia repair (1990's).   His family history includes Healthy in his mother; Hypertension in his father.He reports that he has never smoked. He has never used smokeless tobacco. He reports current alcohol use of about 22.0 - 24.0 standard drinks of alcohol per week. He reports that he does not use drugs.  Current Outpatient Medications on File Prior to Visit  Medication Sig Dispense Refill  . allopurinol (ZYLOPRIM) 100 MG tablet Take 1 tablet (100 mg total) by mouth daily. 90 tablet 1  . atorvastatin (LIPITOR) 20 MG tablet Take 1 tablet (20 mg total) by mouth daily. 90 tablet 1  . Iron, Ferrous Sulfate, 325 (65 Fe) MG TABS Take 325 mg by mouth daily. 30 tablet 11  . senna (SENOKOT) 8.6 MG TABS tablet Take 1 tablet (8.6 mg total) by mouth daily. 120  tablet 0  . sildenafil (VIAGRA) 100 MG tablet Take 0.5 tablets (50 mg total) by mouth daily as needed for erectile dysfunction. 5 tablet 3   No current facility-administered medications on file prior to visit.    ROS Review of Systems  Gastrointestinal: Positive for nausea and vomiting.  Endocrine: Positive for polyuria.  Genitourinary:       Groin area pain   All other systems reviewed and are negative.   Objective:  BP (!) 141/99 (BP Location: Right Arm, Patient Position: Sitting, Cuff Size: Large)   Pulse (!) 112   Temp (!) 97.5 F (36.4 C) (Temporal)   Ht 5\' 10"  (1.778 m)   Wt 205 lb (93 kg)   SpO2 94%   BMI 29.41 kg/m   BP Readings from Last 3 Encounters:  08/12/20 (!) 141/99  05/23/20 (!) 170/90  05/12/20 (!) 187/130    Wt Readings from Last 3 Encounters:  08/12/20 205 lb (93 kg)  05/12/20 210 lb (95.3 kg)  07/27/19 228 lb 9.6 oz (103.7 kg)    Physical Exam Vitals reviewed.  HENT:     Head: Normocephalic.     Right Ear: External ear normal.     Nose: Nose normal.  Eyes:     Extraocular Movements: Extraocular movements intact.  Cardiovascular:     Rate and Rhythm: Normal rate and regular rhythm.  Pulmonary:     Breath sounds: Normal breath sounds.  Abdominal:     General: Bowel sounds are normal.     Palpations: Abdomen is  soft.  Musculoskeletal:        General: Normal range of motion.     Cervical back: Normal range of motion and neck supple.  Skin:    General: Skin is warm and dry.  Neurological:     Mental Status: He is alert and oriented to person, place, and time.  Psychiatric:        Mood and Affect: Mood normal.        Behavior: Behavior normal.        Thought Content: Thought content normal.        Judgment: Judgment normal.     Lab Results  Component Value Date   HGBA1C 6.2 (A) 08/12/2020   HGBA1C 6.7 (A) 08/31/2019   HGBA1C 6.9 (A) 05/28/2019    Lab Results  Component Value Date   WBC 7.2 05/12/2020   HGB 13.3 05/12/2020    HCT 38.6 05/12/2020   PLT 306 05/12/2020   GLUCOSE 113 (H) 05/12/2020   CHOL 200 (H) 05/12/2020   TRIG 83 05/12/2020   HDL 69 05/12/2020   LDLCALC 116 (H) 05/12/2020   ALT 14 03/11/2020   AST 13 05/12/2020   NA 143 05/12/2020   K 4.4 05/12/2020   CL 103 05/12/2020   CREATININE 1.76 (H) 05/12/2020   BUN 23 05/12/2020   CO2 24 03/11/2020   TSH 1.760 05/12/2020   HGBA1C 6.2 (A) 08/12/2020     Assessment & Plan:   Leverett was seen today for diabetes.  Diagnoses and all orders for this visit:  Type 2 diabetes mellitus without complication, without long-term current use of insulin (HCC) -     HgB A1c 6.2  Prediabetes  ADA 5.7-6.4 medication changed to metformin 500 XL daily  Hypertension, unspecified type Counseled on blood pressure goal of less than 130/80, low-sodium, DASH diet, medication compliance, 150 minutes of moderate intensity exercise per week. Discussed medication compliance, adverse effects. Medications chlorthalidone 50 mg daily, amlodipine 10 mg daily carvedilol 6.25 twice daily and losartan 100 mg.  Neuropathy Peripheral neuropathy develops slowly over time leading to a loss of sensation. Burning, stabbing, or aching pain in the legs or feet are common symptoms.This can lead to calluses or sores on areas of constant pressure, reduced ability to feel temperature changes.    I have discontinued Jarian Rorie's ondansetron and metFORMIN. I am also having him start on metFORMIN. Additionally, I am having him maintain his Iron (Ferrous Sulfate), allopurinol, atorvastatin, senna, sildenafil, carvedilol, chlorthalidone, losartan, and amLODipine.  Meds ordered this encounter  Medications  . carvedilol (COREG) 6.25 MG tablet    Sig: Take 1 tablet (6.25 mg total) by mouth 2 (two) times daily with a meal.    Dispense:  180 tablet    Refill:  1  . chlorthalidone (HYGROTON) 50 MG tablet    Sig: Take 1 tablet (50 mg total) by mouth daily.    Dispense:  90 tablet     Refill:  1  . losartan (COZAAR) 100 MG tablet    Sig: Take 1 tablet (100 mg total) by mouth daily.    Dispense:  90 tablet    Refill:  1  . amLODipine (NORVASC) 10 MG tablet    Sig: Take 1 tablet (10 mg total) by mouth daily.    Dispense:  90 tablet    Refill:  1  . metFORMIN (GLUCOPHAGE XR) 500 MG 24 hr tablet    Sig: Take 1 tablet (500 mg total) by mouth daily with breakfast.  Dispense:  90 tablet    Refill:  1     Follow-up:   Return in about 2 weeks (around 08/26/2020) for BP/fasting labs.  The above assessment and management plan was discussed with the patient. The patient verbalized understanding of and has agreed to the management plan. Patient is aware to call the clinic if symptoms fail to improve or worsen. Patient is aware when to return to the clinic for a follow-up visit. Patient educated on when it is appropriate to go to the emergency department.   Gwinda Passe, NP-C

## 2020-08-15 ENCOUNTER — Ambulatory Visit (HOSPITAL_COMMUNITY)
Admission: EM | Admit: 2020-08-15 | Discharge: 2020-08-15 | Disposition: A | Discharge status: Home / Self Care | Payer: BC Managed Care – PPO | Attending: Emergency Medicine | Admitting: Emergency Medicine

## 2020-08-15 ENCOUNTER — Encounter (HOSPITAL_COMMUNITY): Payer: Self-pay

## 2020-08-15 ENCOUNTER — Other Ambulatory Visit: Payer: Self-pay

## 2020-08-15 DIAGNOSIS — B356 Tinea cruris: Secondary | ICD-10-CM | POA: Diagnosis present

## 2020-08-15 DIAGNOSIS — R42 Dizziness and giddiness: Secondary | ICD-10-CM | POA: Diagnosis present

## 2020-08-15 DIAGNOSIS — N50812 Left testicular pain: Secondary | ICD-10-CM

## 2020-08-15 DIAGNOSIS — N50811 Right testicular pain: Secondary | ICD-10-CM

## 2020-08-15 DIAGNOSIS — R369 Urethral discharge, unspecified: Secondary | ICD-10-CM

## 2020-08-15 DIAGNOSIS — N5082 Scrotal pain: Secondary | ICD-10-CM

## 2020-08-15 MED ORDER — CLOTRIMAZOLE 1 % EX CREA: TOPICAL_CREAM | CUTANEOUS | 0 refills | Status: DC

## 2020-08-15 NOTE — Discharge Instructions (Signed)
Exam does not indicate any obvious source of your symptoms.  Drink plenty of water to ensure adequate hydration to prevent dizziness.  We are testing to ensure no infectious source of penile symptoms.  Unfortunately we were unable to test your urine today.  I do feel that you likely will need further imaging of your scrotum, so I recommend following up with urology or with your primary care provider for further evaluation.  Go to the ER for any worsening of symptoms.

## 2020-08-15 NOTE — ED Triage Notes (Signed)
Pt reports testicles pain, yellow penile discharge and dizziness when standing up x 2 weeks.

## 2020-08-15 NOTE — ED Provider Notes (Signed)
[facility]    CSN: [id number] Arrival date & time: 08/15/20  1122      History   Chief Complaint Chief Complaint  Patient presents with  . Testicle Pain  . Penile Discharge  . Dizziness    HPI Tony Dean is a 60 y.o. male.   Tony Dean presents with complaints of testicular and scrotal pain, which is worse with straining or lifting, and has been ongoing now for at least a month. He feels like when he feels the scrotal pain he feels dizzy. No LOC. He has noted some intermittent yellow penile discharge. No redness swelling or warmth. No palpable or obvious lumps or bumps. No fevers. No pelvic pain. No urinary symptoms. Denies any previous similar. States he is not sexually active, denies concerns for stds. Follows with a PCP and saw her last 3 days ago. History of DM and htn.      ROS per HPI, negative if not otherwise mentioned.      Past Medical History:  Diagnosis Date  . Dizziness   . Gout   . Hypertension     Patient Active Problem List   Diagnosis Date Noted  . Hyperuricemia 05/12/2020  . Hypertension 05/12/2020  . 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 Type 2 diabetes mellitus without complication, without long-term current use of insulin (HCC) 05/12/2020  . Non compliance w medication regimen 05/12/2020  . Alcoholism (HCC) 05/12/2020  . 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 History of gout 05/12/2020  . Other constipation 05/12/2020  . Neuropathy 05/12/2020    Past Surgical History:  Procedure Laterality Date  . APPENDECTOMY    . INGUINAL HERNIA REPAIR  1990's   right       Home Medications    Prior to Admission medications   Medication Sig Start Date End Date Taking? Authorizing Provider  clotrimazole (LOTRIMIN) 1 % cream Apply to affected area 2 times daily 08/15/20  Yes Dalayah Deahl, Dorene Grebe B, NP  allopurinol (ZYLOPRIM) 100 MG tablet Take 1 tablet (100 mg total) by mouth daily. 05/12/20   Mayers, Cari  S, PA-C  amLODipine (NORVASC) 10 MG tablet Take 1 tablet (10 mg total) by mouth daily. 08/12/20   Grayce Sessions, NP  atorvastatin (LIPITOR) 20 MG tablet Take 1 tablet (20 mg total) by mouth daily. 05/12/20   Mayers, Cari S, PA-C  carvedilol (COREG) 6.25 MG tablet Take 1 tablet (6.25 mg total) by mouth 2 (two) times daily with a meal. 08/12/20   Grayce Sessions, NP  chlorthalidone (HYGROTON) 50 MG tablet Take 1 tablet (50 mg total) by mouth daily. 08/12/20   Grayce Sessions, NP  Iron, Ferrous Sulfate, 325 (65 Fe) MG TABS Take 325 mg by mouth daily. 02/22/19   Grayce Sessions, NP  losartan (COZAAR) 100 MG tablet Take 1 tablet (100 mg total) by mouth daily. 08/12/20   Grayce Sessions, NP  metFORMIN (GLUCOPHAGE XR) 500 MG 24 hr tablet Take 1 tablet (500 mg total) by mouth daily with breakfast. 08/12/20   Grayce Sessions, NP  senna (SENOKOT) 8.6 MG TABS tablet Take 1 tablet (8.6 mg total) by mouth daily. 05/12/20   Mayers, Cari S, PA-C  sildenafil (VIAGRA) 100 MG tablet Take 0.5 tablets (50 mg total) by mouth daily as needed for erectile dysfunction. 05/12/20   Mayers, Kasandra Knudsen, PA-C    Family History Family History  Problem Relation Age of Onset  . Healthy Mother   . Hypertension  Father   . Colon cancer Neg Hx     Social History Social History   Tobacco Use  . Smoking status: Never Smoker  . Smokeless tobacco: Never Used  Substance Use Topics  . Alcohol use: Yes    Alcohol/week: 22.0 - 24.0 standard drinks    Types: 14 Cans of beer, 8 - 10 Shots of liquor per week  . Drug use: No     Allergies   Lisinopril   Review of Systems Review of Systems   Physical Exam Triage Vital Signs ED Triage Vitals  Enc Vitals Group     BP 08/15/20 1145 (!) 128/95     Pulse Rate 08/15/20 1145 82     Resp 08/15/20 1145 19     Temp 08/15/20 1145 98.4 F (36.9 C)     Temp Source 08/15/20 1145 Oral     SpO2 08/15/20 1145 97 %     Weight --      Height --      Head  Circumference --      Peak Flow --      Pain Score 08/15/20 1143 8     Pain Loc --      Pain Edu? --      Excl. in GC? --    No data found.  Updated Vital Signs BP (!) 128/95 (BP Location: Right Arm)   Pulse 82   Temp 98.4 F (36.9 C) (Oral)   Resp 19   SpO2 97%   Visual Acuity Right Eye Distance:   Left Eye Distance:   Bilateral Distance:    Right Eye Near:   Left Eye Near:    Bilateral Near:     Physical Exam Exam conducted with a chaperone present.  Constitutional:      Appearance: He is well-developed.  HENT:     Head: Normocephalic and atraumatic.  Cardiovascular:     Rate and Rhythm: Normal rate.  Pulmonary:     Effort: Pulmonary effort is normal.  Abdominal:     Tenderness: There is no abdominal tenderness.     Hernia: There is no hernia in the left inguinal area or right inguinal area.  Genitourinary:    Penis: Normal and circumcised.      Testes:        Right: Tenderness present. Swelling not present.        Left: Tenderness present. Swelling not present.     Epididymis:     Right: Tenderness present.     Left: Tenderness present.     Comments: Scrotum and contents grossly tender on exam without point tenderness; no palpable or visible abnormalities to scrotum; tinea cruris noted to groin  Lymphadenopathy:     Lower Body: No right inguinal adenopathy. No left inguinal adenopathy.  Skin:    General: Skin is warm and dry.  Neurological:     General: No focal deficit present.     Mental Status: He is alert and oriented to person, place, and time.      UC Treatments / Results  Labs (all labs ordered are listed, but only abnormal results are displayed) Labs Reviewed  CYTOLOGY, (ORAL, ANAL, URETHRAL) ANCILLARY ONLY    EKG   Radiology No results found.  Procedures Procedures (including critical care time)  Medications Ordered in UC Medications - No data to display  Initial Impression / Assessment and Plan / UC Course  I have reviewed  the triage vital signs and the nursing notes.  Pertinent labs &  imaging results that were available during my care of the patient were reviewed by me and considered in my medical decision making (see chart for details).     Patient unable to provide urine specimen here today unfortunately. Exam is grossly normal, although with generalized scrotal discomfort. No obvious palpable or visible lumps, hernias or bumps. No redness swelling or warmth. No penile abnormalities. Penile cytology collected and pending. Vitals stable. Likely will need scrotal ultrasound, encouraged follow up with PCP and/or urology. Patient verbalized understanding and agreeable to plan.  Ambulatory out of clinic without difficulty.    Final Clinical Impressions(s) / UC Diagnoses   Final diagnoses:  Scrotal pain  Pain in both testicles  Dizziness  Penile discharge  Jock itch     Discharge Instructions     Exam does not indicate any obvious source of your symptoms.  Drink plenty of water to ensure adequate hydration to prevent dizziness.  We are testing to ensure no infectious source of penile symptoms.  Unfortunately we were unable to test your urine today.  I do feel that you likely will need further imaging of your scrotum, so I recommend following up with urology or with your primary care provider for further evaluation.  Go to the ER for any worsening of symptoms.    ED Prescriptions    Medication Sig Dispense Auth. Provider   clotrimazole (LOTRIMIN) 1 % cream Apply to affected area 2 times daily 15 g Linus Mako B, NP     PDMP not reviewed this encounter.   Georgetta Haber, NP 08/15/20 1354

## 2020-08-16 LAB — CYTOLOGY, (ORAL, ANAL, URETHRAL) ANCILLARY ONLY
Chlamydia: NEGATIVE
Comment: NEGATIVE
Comment: NEGATIVE
Comment: NORMAL
Neisseria Gonorrhea: NEGATIVE
Trichomonas: NEGATIVE

## 2020-08-17 ENCOUNTER — Other Ambulatory Visit: Payer: Self-pay

## 2020-08-26 ENCOUNTER — Ambulatory Visit (INDEPENDENT_AMBULATORY_CARE_PROVIDER_SITE_OTHER): Payer: BC Managed Care – PPO | Admitting: Primary Care

## 2020-08-26 ENCOUNTER — Encounter (INDEPENDENT_AMBULATORY_CARE_PROVIDER_SITE_OTHER): Payer: Self-pay | Admitting: Primary Care

## 2020-08-26 ENCOUNTER — Other Ambulatory Visit: Payer: Self-pay

## 2020-08-26 VITALS — BP 130/91 | HR 78 | Temp 97.3°F | Ht 70.0 in | Wt 205.8 lb

## 2020-08-26 DIAGNOSIS — I891 Lymphangitis: Secondary | ICD-10-CM

## 2020-08-26 DIAGNOSIS — I10 Essential (primary) hypertension: Secondary | ICD-10-CM

## 2020-08-26 DIAGNOSIS — Z1211 Encounter for screening for malignant neoplasm of colon: Secondary | ICD-10-CM

## 2020-08-26 NOTE — Progress Notes (Signed)
[facility]   Tony Dean is a 60 y.o. male presents for hypertension evaluation, Denies shortness of breath, headaches, chest pain or lower extremity edema, sudden onset, vision changes, unilateral weakness, dizziness, paresthesias. He voice concerns with groin pains   Patient reports adherence with medications.  Dietary habits include: low sodium  Exercise habits include:working  Family / Social history:unknown    Past Medical History:  Diagnosis Date  . Dizziness   . Gout   . Hypertension    Past Surgical History:  Procedure Laterality Date  . APPENDECTOMY    . INGUINAL HERNIA REPAIR  1990's   right   Allergies  Allergen Reactions  . Lisinopril Cough   Current Outpatient Medications on File Prior to Visit  Medication Sig Dispense Refill  . allopurinol (ZYLOPRIM) 100 MG tablet Take 1 tablet (100 mg total) by mouth daily. 90 tablet 1  . amLODipine (NORVASC) 10 MG tablet Take 1 tablet (10 mg total) by mouth daily. 90 tablet 1  . atorvastatin (LIPITOR) 20 MG tablet Take 1 tablet (20 mg total) by mouth daily. 90 tablet 1  . carvedilol (COREG) 6.25 MG tablet Take 1 tablet (6.25 mg total) by mouth 2 (two) times daily with a meal. 180 tablet 1  . chlorthalidone (HYGROTON) 50 MG tablet Take 1 tablet (50 mg total) by mouth daily. 90 tablet 1  . clotrimazole (LOTRIMIN) 1 % cream Apply to affected area 2 times daily 15 g 0  . Iron, Ferrous Sulfate, 325 (65 Fe) MG TABS Take 325 mg by mouth daily. 30 tablet 11  . losartan (COZAAR) 100 MG tablet Take 1 tablet (100 mg total) by mouth daily. 90 tablet 1  . metFORMIN (GLUCOPHAGE XR) 500 MG 24 hr tablet Take 1 tablet (500 mg total) by mouth daily with breakfast. 90 tablet 1  . senna (SENOKOT) 8.6 MG TABS tablet Take 1 tablet (8.6 mg total) by mouth daily. 120 tablet 0  . sildenafil (VIAGRA) 100 MG tablet Take 0.5 tablets (50 mg total) by mouth daily as needed for erectile dysfunction. 5 tablet 3   No current  facility-administered medications on file prior to visit.   Social History   Socioeconomic History  . Marital status: Legally Separated    Spouse name: Not on file  . Number of children: Not on file  . Years of education: Not on file  . Highest education level: Not on file  Occupational History  . Not on file  Tobacco Use  . Smoking status: Never Smoker  . Smokeless tobacco: Never Used  Substance and Sexual Activity  . Alcohol use: Yes    Alcohol/week: 22.0 - 24.0 standard drinks    Types: 14 Cans of beer, 8 - 10 Shots of liquor per week  . Drug use: No  . Sexual activity: Not Currently  Other Topics Concern  . Not on file  Social History Narrative  . Not on file   Social Determinants of Health   Financial Resource Strain: Not on file  Food Insecurity: Not on file  Transportation Needs: Not on file  Physical Activity: Not on file  Stress: Not on file  Social Connections: Not on file  Intimate Partner Violence: Not on file   Family History  Problem Relation Age of Onset  . Healthy Mother   . Hypertension Father   . Colon cancer Neg Hx      OBJECTIVE:  Vitals:   08/26/20 0940  BP: (!) 130/91  Pulse: 78  Temp: Marland Kitchen)  97.3 F (36.3 C)  TempSrc: Temporal  SpO2: 95%  Weight: 205 lb 12.8 oz (93.4 kg)  Height: 5\' 10"  (1.778 m)    Physical Exam Vitals reviewed.  HENT:     Head: Normocephalic.     Right Ear: External ear normal.     Left Ear: External ear normal.     Nose: Nose normal.  Eyes:     Extraocular Movements: Extraocular movements intact.  Cardiovascular:     Rate and Rhythm: Normal rate and regular rhythm.  Pulmonary:     Effort: Pulmonary effort is normal.     Breath sounds: Normal breath sounds.  Abdominal:     General: Bowel sounds are normal.     Palpations: Abdomen is soft.  Genitourinary:    Testes: Normal.  Musculoskeletal:        General: Normal range of motion.     Cervical back: Normal range of motion.  Skin:    General: Skin is  warm and dry.  Neurological:     Mental Status: He is alert and oriented to person, place, and time.  Psychiatric:        Mood and Affect: Mood normal.        Behavior: Behavior normal.        Thought Content: Thought content normal.        Judgment: Judgment normal.      Review of Systems  Genitourinary:       Bilateral groin pain bilateral   Neurological: Positive for dizziness.       When in the sun   All other systems reviewed and are negative.   Last 3 Office BP readings: BP Readings from Last 3 Encounters:  08/26/20 (!) 130/91  08/15/20 (!) 128/95  08/12/20 (!) 141/99    BMET    Component Value Date/Time   NA 143 05/12/2020 1534   K 4.4 05/12/2020 1534   CL 103 05/12/2020 1534   CO2 24 03/11/2020 1109   GLUCOSE 113 (H) 05/12/2020 1534   GLUCOSE 126 (H) 03/11/2020 1109   BUN 23 05/12/2020 1534   CREATININE 1.76 (H) 05/12/2020 1534   CALCIUM 9.2 05/12/2020 1534   GFRNONAA 41 (L) 05/12/2020 1534   GFRNONAA 30 (L) 03/11/2020 1109   GFRAA 48 (L) 05/12/2020 1534    Renal function: CrCl cannot be calculated (Patient's most recent lab result is older than the maximum 21 days allowed.).  Clinical ASCVD: Yes  The 10-year ASCVD risk score 05/14/2020 DC Jr., et al., 2013) is: 21.8%   Values used to calculate the score:     Age: 68 years     Sex: Male     Is Non-Hispanic African American: Yes     Diabetic: Yes     Tobacco smoker: No     Systolic Blood Pressure: 130 mmHg     Is BP treated: Yes     HDL Cholesterol: 69 mg/dL     Total Cholesterol: 200 mg/dL  ASCVD risk factors include- 46   ASSESSMENT & PLAN: Tony Dean was seen today for blood pressure check and groin pain.  Diagnoses and all orders for this visit:  Lymphangitis of groin Swelling was absent , tenderness absent , no inguinal hernia present  Hx of syphilis earlier in life and tx  Colon cancer screening -     Fecal occult blood, imunochemical; Future   Hypertension, unspecified  type -Counseled on lifestyle modifications for blood pressure control including reduced dietary sodium, increased exercise, weight reduction and  adequate sleep. Also, educated patient about the risk for cardiovascular events, stroke and heart attack. Also counseled patient about the importance of medication adherence. If you participate in smoking, it is important to stop using tobacco as this will increase the risks associated with uncontrolled blood pressure.   -Hypertension longstanding/ currently amlodipine 10 mg daily, losartan 100 mg daily, chlorthalidone 50 mg daily and Coreg 6.25 take twice daily are current medications. Patient is adherent with current medications.   Goal BP:  For patients younger than 60: Goal BP < 130/80. For patients 60 and older: Goal BP < 140/90. For patients with diabetes: Goal BP < 130/80. Your most recent BP:  130/91  Minimize salt intake. Minimize alcohol intake  This note has been created with Education officer, environmental. Any transcriptional errors are unintentional.   Grayce Sessions, NP 08/26/2020, 9:48 AM

## 2020-08-29 ENCOUNTER — Other Ambulatory Visit (INDEPENDENT_AMBULATORY_CARE_PROVIDER_SITE_OTHER): Payer: Self-pay

## 2020-08-29 DIAGNOSIS — Z1211 Encounter for screening for malignant neoplasm of colon: Secondary | ICD-10-CM

## 2020-08-31 LAB — FECAL OCCULT BLOOD, IMMUNOCHEMICAL: Fecal Occult Bld: NEGATIVE

## 2020-10-31 ENCOUNTER — Emergency Department (HOSPITAL_COMMUNITY)
Admission: EM | Admit: 2020-10-31 | Discharge: 2020-11-01 | Disposition: A | Discharge status: Home / Self Care | Payer: BC Managed Care – PPO | Attending: Emergency Medicine | Admitting: Emergency Medicine

## 2020-10-31 ENCOUNTER — Encounter (HOSPITAL_COMMUNITY): Payer: Self-pay

## 2020-10-31 ENCOUNTER — Other Ambulatory Visit: Payer: Self-pay

## 2020-10-31 ENCOUNTER — Ambulatory Visit (INDEPENDENT_AMBULATORY_CARE_PROVIDER_SITE_OTHER): Payer: Self-pay | Admitting: *Deleted

## 2020-10-31 ENCOUNTER — Ambulatory Visit (INDEPENDENT_AMBULATORY_CARE_PROVIDER_SITE_OTHER)
Admission: EM | Admit: 2020-10-31 | Discharge: 2020-10-31 | Disposition: A | Discharge status: Home / Self Care | Payer: BC Managed Care – PPO | Source: Home / Self Care

## 2020-10-31 DIAGNOSIS — Z7984 Long term (current) use of oral hypoglycemic drugs: Secondary | ICD-10-CM | POA: Insufficient documentation

## 2020-10-31 DIAGNOSIS — R42 Dizziness and giddiness: Secondary | ICD-10-CM

## 2020-10-31 DIAGNOSIS — R109 Unspecified abdominal pain: Secondary | ICD-10-CM | POA: Insufficient documentation

## 2020-10-31 DIAGNOSIS — I10 Essential (primary) hypertension: Secondary | ICD-10-CM | POA: Insufficient documentation

## 2020-10-31 DIAGNOSIS — Z79899 Other long term (current) drug therapy: Secondary | ICD-10-CM | POA: Insufficient documentation

## 2020-10-31 DIAGNOSIS — R339 Retention of urine, unspecified: Secondary | ICD-10-CM | POA: Insufficient documentation

## 2020-10-31 DIAGNOSIS — R509 Fever, unspecified: Secondary | ICD-10-CM

## 2020-10-31 DIAGNOSIS — E1140 Type 2 diabetes mellitus with diabetic neuropathy, unspecified: Secondary | ICD-10-CM | POA: Diagnosis not present

## 2020-10-31 DIAGNOSIS — M545 Low back pain, unspecified: Secondary | ICD-10-CM | POA: Diagnosis not present

## 2020-10-31 LAB — POCT URINALYSIS DIPSTICK, ED / UC
Bilirubin Urine: NEGATIVE
Glucose, UA: NEGATIVE mg/dL
Leukocytes,Ua: NEGATIVE
Nitrite: NEGATIVE
Protein, ur: NEGATIVE mg/dL
Specific Gravity, Urine: 1.01 (ref 1.005–1.030)
Urobilinogen, UA: 1 mg/dL (ref 0.0–1.0)
pH: 5 (ref 5.0–8.0)

## 2020-10-31 NOTE — ED Triage Notes (Addendum)
Pt c/o stiffness and pain in left lower back, unable to urinate more than a small amount at a time, and states has noticed dark urine. Pt also c/o dizziness since this morning.   Reports a fall related to dizziness a couple of months ago.

## 2020-10-31 NOTE — ED Triage Notes (Signed)
Pt c/o lightheadedness/ dizziness x2wks, back pain since this AM. Endorses NV last week. Denies numbness, tingling, CP, SHOB, denies incontinence. States it's "hard to start" peeing, "dribbles," denies burning.  9/10 lower back tight/sharp pain L lower back

## 2020-10-31 NOTE — Telephone Encounter (Signed)
Reason for Disposition  [1] MODERATE dizziness (e.g., interferes with normal activities) AND [2] has NOT been evaluated by physician for this  (Exception: dizziness caused by heat exposure, sudden standing, or poor fluid intake)    Sick all last week.   C/o left lower back pain, dizziness, difficulty urinating.  Answer Assessment - Initial Assessment Questions 1. DESCRIPTION: "Describe your dizziness."     I'm having dizzy spells.   I have a headache and I'm having dizzy spells for a couple of days.   My back is hurting too.  I'm at work now.    2. LIGHTHEADED: "Do you feel lightheaded?" (e.g., somewhat faint, woozy, weak upon standing)     Yes like I'm going to pass out.   I was home sick all last week.   I couldn't eat at all last week.   I was vomiting and having diarrhea most of the week.  I'm eating and drinking today.    I'm also having back pain on the left side lower side.   I'm only peeing a little bit.  A few drops at the time.  I feel like I need to pee but I can't.   This going on for a week too.   No blood in urine.  No change in color or smell.    No history of kidney stones.   Constant pain in left lower back.   3. VERTIGO: "Do you feel like either you or the room is spinning or tilting?" (i.e. vertigo)     No 4. SEVERITY: "How bad is it?"  "Do you feel like you are going to faint?" "Can you stand and walk?"   - MILD: Feels slightly dizzy, but walking normally.   - MODERATE: Feels unsteady when walking, but not falling; interferes with normal activities (e.g., school, work).   - SEVERE: Unable to walk without falling, or requires assistance to walk without falling; feels like passing out now.      Mild dizziness 5. ONSET:  "When did the dizziness begin?"     All week 6. AGGRAVATING FACTORS: "Does anything make it worse?" (e.g., standing, change in head position)     Moving around. When the weather gets nice or the sun comes out I feel fine.   I can't be in the sunlight.   It  makes me dizzy.    A week ago it started when I'm in the sun I'm dizzy.   I can't stand up for long I have to come in and lay down.   Denies shortness of breath or chest tightness when this occurs.    7. HEART RATE: "Can you tell me your heart rate?" "How many beats in 15 seconds?"  (Note: not all patients can do this)       Not asked 8. CAUSE: "What do you think is causing the dizziness?"     I'm getting over heated.   The area where I work gets hot in places.  I drank 3 bottles of water today already.     9. RECURRENT SYMPTOM: "Have you had dizziness before?" If Yes, ask: "When was the last time?" "What happened that time?"     Yes last year from over heating.  10. OTHER SYMPTOMS: "Do you have any other symptoms?" (e.g., fever, chest pain, vomiting, diarrhea, bleeding)       I was in bed all last week.   Not checked last week for Covid.   I'm dizzy and having pain in  my back. 11. PREGNANCY: "Is there any chance you are pregnant?" "When was your last menstrual period?"       N/A  Protocols used: Dizziness - Lightheadedness-A-AH

## 2020-10-31 NOTE — Telephone Encounter (Signed)
Pt called in (sister on phone with him) c/o several issues.   He was sick in bed all last week with diarrhea and vomiting.   He is at work now and is c/o being dizzy and having a headache.Marland Kitchen   He is also c/o left lower back pain and difficulty urinating.   (It's only a few drops at the time).  He also mentioned when he is in the sun he feels dizzy and doesn't feel good.   No appts available with Gwinda Passe, NP.     I referred him to the urgent care which he is agreeable to going.   I also made him an appt with Leim Fabry, NP for 11/09/2020 at 2:10 PM.

## 2020-10-31 NOTE — Discharge Instructions (Addendum)
Please go to the hospital today for further evaluation and management of your symptoms.

## 2020-10-31 NOTE — ED Provider Notes (Signed)
[facility]    CSN: [id number] Arrival date & time: 10/31/20  1736      History   Chief Complaint Chief Complaint  Patient presents with   Dizziness   Back Pain   Urinary Retention    HPI Tony Dean is a 60 y.o. male.   Patient presents with 2-week history of urinary retention, dizziness, low right-sided back pain.  Denies any fever at home, urinary burning, urinary frequency.  Denies history of kidney stones.  Does have history of urinary tract infection but states these symptoms feel different.  States that he is able to urinate but is not able to fully empty his bladder.  Denies any lower abdominal pain.  Does endorse some left-sided lower back pain.   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 Authorizing Provider  acetaminophen (TYLENOL) 500 MG tablet Take 1 tablet (500 mg total) by mouth every 6 (six) hours as needed. 11/01/20   Horton, Mayer Masker, MD  allopurinol (ZYLOPRIM) 100 MG tablet Take 1 tablet (100 mg total) by mouth daily. 05/12/20   Mayers, Cari S, PA-C  amLODipine (NORVASC) 10 MG tablet Take 1 tablet (10 mg total) by mouth daily. 08/12/20   Grayce Sessions, NP  atorvastatin (LIPITOR) 20  MG tablet Take 1 tablet (20 mg total) by mouth daily. 05/12/20   Mayers, Cari S, PA-C  carvedilol (COREG) 6.25 MG tablet Take 1 tablet (6.25 mg total) by mouth 2 (two) times daily with a meal. 08/12/20   Grayce Sessions, NP  chlorthalidone (HYGROTON) 50 MG tablet Take 1 tablet (50 mg total) by mouth daily. 08/12/20   Grayce Sessions, NP  clotrimazole (LOTRIMIN) 1 % cream Apply to affected area 2 times daily 08/15/20   Linus Mako B, NP  cyclobenzaprine (FLEXERIL) 5 MG tablet Take 1 tablet (5 mg total) by mouth 2 (two) times daily as needed for muscle spasms. 11/01/20   Horton, Mayer Masker, MD  Iron, Ferrous Sulfate, 325 (65 Fe) MG TABS Take 325 mg by mouth daily. 02/22/19   Grayce Sessions, NP  losartan (COZAAR) 100 MG tablet Take 1 tablet (100 mg total) by mouth daily. 08/12/20   Grayce Sessions, NP  metFORMIN (GLUCOPHAGE XR) 500 MG 24 hr tablet Take 1 tablet (500 mg total) by mouth daily with breakfast. 08/12/20   Grayce Sessions, NP  senna (SENOKOT) 8.6 MG TABS tablet Take 1 tablet (8.6 mg total) by mouth daily. 05/12/20   Mayers, Cari S, PA-C  sildenafil (VIAGRA) 100 MG tablet Take 0.5 tablets (50 mg total) by mouth daily as needed for erectile dysfunction. 05/12/20   Mayers, Kasandra Knudsen, PA-C  Family History Family History  Problem Relation Age of Onset   Healthy Mother    Hypertension Father    Colon cancer Neg Hx     Social History Social History   Tobacco Use   Smoking status: Never   Smokeless tobacco: Never  Substance Use Topics   Alcohol use: Yes    Alcohol/week: 22.0 - 24.0 standard drinks    Types: 14 Cans of beer, 8 - 10 Shots of liquor per week    Comment: reports drinks daily   Drug use: No     Allergies   Lisinopril   Review of Systems Review of Systems  Musculoskeletal:  Positive for back pain.  Neurological:  Positive for dizziness.  Per HPI  Physical Exam Triage Vital Signs ED Triage Vitals  Enc Vitals Group     BP 10/31/20 1839 122/74      Pulse Rate 10/31/20 1839 (!) 102     Resp 10/31/20 1839 18     Temp 10/31/20 1839 99.7 F (37.6 C)     Temp src --      SpO2 10/31/20 1839 100 %     Weight --      Height --      Head Circumference --      Peak Flow --      Pain Score 10/31/20 1840 9     Pain Loc --      Pain Edu? --      Excl. in GC? --    No data found.  Updated Vital Signs BP 122/74   Pulse (!) 102   Temp 99.7 F (37.6 C)   Resp 18   SpO2 100%   Visual Acuity Right Eye Distance:   Left Eye Distance:   Bilateral Distance:    Right Eye Near:   Left Eye Near:    Bilateral Near:     Physical Exam Constitutional:      General: He is not in acute distress.    Appearance: Normal appearance.  HENT:     Head: Normocephalic and atraumatic.  Eyes:     Extraocular Movements: Extraocular movements intact.     Conjunctiva/sclera: Conjunctivae normal.  Cardiovascular:     Rate and Rhythm: Normal rate and regular rhythm.     Pulses: Normal pulses.     Heart sounds: Normal heart sounds.  Pulmonary:     Effort: Pulmonary effort is normal. No respiratory distress.     Breath sounds: Normal breath sounds.  Abdominal:     General: Abdomen is flat. Bowel sounds are normal. There is no distension.     Palpations: Abdomen is soft.     Tenderness: There is no abdominal tenderness.  Musculoskeletal:     Cervical back: Normal.     Thoracic back: Normal.     Lumbar back: Tenderness present.     Comments: Tenderness to palpation to right lower back.  Skin:    General: Skin is warm and dry.  Neurological:     General: No focal deficit present.     Mental Status: He is alert and oriented to person, place, and time. Mental status is at baseline.  Psychiatric:        Mood and Affect: Mood normal.        Behavior: Behavior normal.        Thought Content: Thought content normal.        Judgment: Judgment normal.     UC Treatments / Results  Labs (all labs  ordered are listed, but only abnormal results are  displayed) Labs Reviewed  POCT URINALYSIS DIPSTICK, ED / UC - Abnormal; Notable for the following components:      Result Value   Ketones, ur TRACE (*)    Hgb urine dipstick TRACE (*)    All other components within normal limits  URINE CULTURE    EKG   Radiology CT Renal Stone Study  Result Date: 11/01/2020 CLINICAL DATA:  60 year old male with history of flank pain and stiffness in the left lower back. Difficulty urinating. EXAM: CT ABDOMEN AND PELVIS WITHOUT CONTRAST TECHNIQUE: Multidetector CT imaging of the abdomen and pelvis was performed following the standard protocol without IV contrast. COMPARISON:  No priors. FINDINGS: Lower chest: Aortic atherosclerosis. Hepatobiliary: No definite suspicious cystic or solid hepatic lesions are confidently identified on today's noncontrast CT examination. Unenhanced appearance of the gallbladder is normal. Pancreas: No definite pancreatic mass or peripancreatic fluid collections or inflammatory changes are noted on today's noncontrast CT examination. Spleen: Unremarkable. Adrenals/Urinary Tract: There are no abnormal calcifications within the collecting system of either kidney, along the course of either ureter, or within the lumen of the urinary bladder. No hydroureteronephrosis or perinephric stranding to suggest urinary tract obstruction at this time. The unenhanced appearance of the kidneys is unremarkable bilaterally. Unenhanced appearance of the kidneys, bilateral adrenal glands and urinary bladder is unremarkable. Stomach/Bowel: Unenhanced appearance of the stomach is normal. There is no pathologic dilatation of small bowel or colon. The appendix is not confidently identified and may be surgically absent. Regardless, there are no inflammatory changes noted adjacent to the cecum to suggest the presence of an acute appendicitis at this time. Vascular/Lymphatic: Aortic atherosclerosis. No lymphadenopathy noted in the abdomen or pelvis. Reproductive:  Prostate gland and seminal vesicles are unremarkable in appearance. Other: Small umbilical hernia containing some omental fat and a short segment of mid small bowel. No significant volume of ascites. No pneumoperitoneum. Musculoskeletal: There are no aggressive appearing lytic or blastic lesions noted in the visualized portions of the skeleton. IMPRESSION: 1. No acute findings are noted in the abdomen or pelvis to account for the patient's symptoms. Specifically, no urinary tract calculi no findings of urinary tract obstruction. 2. Small umbilical hernia containing omental fat and a short segment of mid small bowel, without evidence of bowel incarceration or obstruction at this time. 3. Aortic atherosclerosis. Electronically Signed   By: Trudie Reedaniel  Entrikin M.D.   On: 11/01/2020 06:26    Procedures Procedures (including critical care time)  Medications Ordered in UC Medications - No data to display  Initial Impression / Assessment and Plan / UC Course  I have reviewed the triage vital signs and the nursing notes.  Pertinent labs & imaging results that were available during my care of the patient were reviewed by me and considered in my medical decision making (see chart for details).  Clinical Course as of 11/01/20 0818  Mon Oct 31, 2020  1903 Leukocytes,Ua: NEGATIVE [HF]    Clinical Course User Index [HF] Lance MussFowler, Anatasia Tino E, FNP    Urinalysis was negative for any definitive signs of urinary tract infection.  Urine culture is pending.  Due to urinary retention, duration of symptoms, dizziness, patient was sent to the emergency department to be further evaluated.  Clinical symptoms are likely related to urinary tract infection but cannot rule out kidney stone due to hematuria, pain, urinary retention.  Suspicious for blockage related to kidney stone that is resulting in urinary retention.  Patient was agreeable  plan and left the urgent care to go to the hospital.  Patient stated that he had a friend  here to take him to the hospital. Final Clinical Impressions(s) / UC Diagnoses   Final diagnoses:  Urinary retention  Dizziness  Fever, unspecified     Discharge Instructions      Please go to the hospital today for further evaluation and management of your symptoms.     ED Prescriptions   None    PDMP not reviewed this encounter.   Lance Muss, FNP 11/01/20 [phone]

## 2020-10-31 NOTE — ED Provider Notes (Signed)
Emergency Medicine Provider Triage Evaluation Note  Tony Dean , a 60 y.o. male  was evaluated in triage.  Pt complains of lightheadedness ("like I'm going to faint") x 2 weeks. Symptoms coming and going. No syncope, chest pain, SOB, fevers. Also c/o pain to his left flank x 2 days. Has had similar back pain before. More recently has had urinary hesitancy and decreased stream. No dysuria, genital/perianal numbness, incontinence. Works as a Arboriculturist. Denies known injury.  Review of Systems  Positive: Lightheadedness, back pain Negative: N/V, CP, SOB, incontinence, denies IVDU  Physical Exam  BP 111/80 (BP Location: Right Arm)   Pulse 93   Temp 99.3 F (37.4 C) (Oral)   Resp 18   SpO2 97%  Gen:   Awake, no distress   Resp:  Normal effort  MSK:   Moves extremities without difficulty  Other:  Ambulatory with steady gait. Moving all extremities symmetrically.  Medical Decision Making  Medically screening exam initiated at 10:59 PM.  Appropriate orders placed.  Tony Dean was informed that the remainder of the evaluation will be completed by another provider, this initial triage assessment does not replace that evaluation, and the importance of remaining in the ED until their evaluation is complete.  Lightheadeness Flank pain   Antony Madura, PA-C 10/31/20 2302    Shon Baton, MD 11/01/20 [phone]

## 2020-11-01 ENCOUNTER — Emergency Department (HOSPITAL_COMMUNITY): Payer: BC Managed Care – PPO

## 2020-11-01 LAB — URINALYSIS, ROUTINE W REFLEX MICROSCOPIC
Bacteria, UA: NONE SEEN
Bilirubin Urine: NEGATIVE
Glucose, UA: NEGATIVE mg/dL
Ketones, ur: NEGATIVE mg/dL
Leukocytes,Ua: NEGATIVE
Nitrite: NEGATIVE
Protein, ur: NEGATIVE mg/dL
Specific Gravity, Urine: 1.004 — ABNORMAL LOW (ref 1.005–1.030)
pH: 5 (ref 5.0–8.0)

## 2020-11-01 LAB — CBC WITH DIFFERENTIAL/PLATELET
Abs Immature Granulocytes: 0.07 10*3/uL (ref 0.00–0.07)
Basophils Absolute: 0 10*3/uL (ref 0.0–0.1)
Basophils Relative: 0 %
Eosinophils Absolute: 0.1 10*3/uL (ref 0.0–0.5)
Eosinophils Relative: 1 %
HCT: 35.6 % — ABNORMAL LOW (ref 39.0–52.0)
Hemoglobin: 11.8 g/dL — ABNORMAL LOW (ref 13.0–17.0)
Immature Granulocytes: 1 %
Lymphocytes Relative: 24 %
Lymphs Abs: 2.9 10*3/uL (ref 0.7–4.0)
MCH: 32.2 pg (ref 26.0–34.0)
MCHC: 33.1 g/dL (ref 30.0–36.0)
MCV: 97 fL (ref 80.0–100.0)
Monocytes Absolute: 1.3 10*3/uL — ABNORMAL HIGH (ref 0.1–1.0)
Monocytes Relative: 10 %
Neutro Abs: 8 10*3/uL — ABNORMAL HIGH (ref 1.7–7.7)
Neutrophils Relative %: 64 %
Platelets: 182 10*3/uL (ref 150–400)
RBC: 3.67 MIL/uL — ABNORMAL LOW (ref 4.22–5.81)
RDW: 14.2 % (ref 11.5–15.5)
WBC: 12.3 10*3/uL — ABNORMAL HIGH (ref 4.0–10.5)
nRBC: 0 % (ref 0.0–0.2)

## 2020-11-01 LAB — BASIC METABOLIC PANEL
Anion gap: 12 (ref 5–15)
BUN: 28 mg/dL — ABNORMAL HIGH (ref 6–20)
CO2: 23 mmol/L (ref 22–32)
Calcium: 9.2 mg/dL (ref 8.9–10.3)
Chloride: 98 mmol/L (ref 98–111)
Creatinine, Ser: 2.8 mg/dL — ABNORMAL HIGH (ref 0.61–1.24)
GFR, Estimated: 25 mL/min — ABNORMAL LOW (ref >60)
Glucose, Bld: 96 mg/dL (ref 70–99)
Potassium: 4.1 mmol/L (ref 3.5–5.1)
Sodium: 133 mmol/L — ABNORMAL LOW (ref 135–145)

## 2020-11-01 MED ORDER — ACETAMINOPHEN 500 MG PO TABS
500.0000 mg | ORAL_TABLET | Freq: Four times a day (QID) | ORAL | 0 refills | Status: DC | PRN
Start: 2020-11-01 — End: 2021-06-01

## 2020-11-01 MED ORDER — CYCLOBENZAPRINE HCL 5 MG PO TABS: 5.0000 mg | ORAL_TABLET | Freq: Two times a day (BID) | ORAL | 0 refills | Status: DC | PRN

## 2020-11-01 NOTE — ED Notes (Addendum)
Pt ambulatory to restroom

## 2020-11-01 NOTE — Discharge Instructions (Addendum)
You were seen today for back pain.  You are also noted to be dizzy.  Your work-up is reassuring.  Your CT scan does not show any evidence of kidney stone or kidney infection.  Urinalysis is reassuring.  Make sure that you are staying hydrated.  Take Tylenol and Flexeril as needed for your back pain.  Avoid heavy lifting.

## 2020-11-01 NOTE — ED Provider Notes (Signed)
[facility] EMERGENCY DEPARTMENT Provider Note   CSN: [id number] Arrival date & time: 10/31/20  1929     History Chief Complaint  Patient presents with   Back Pain   Dizziness    Tony Dean is a 60 y.o. male.  HPI     This 60 year old male with a history of diabetes, alcohol abuse, hypertension who presents with dizziness and back pain.  Patient reports he was at work when he had onset of lightheadedness.  He denies room spinning dizziness.  He states he felt like he was going to pass out.  He also is having some left-sided back discomfort.  It is worse with movement.  He has had similar pain in the past.  Has not noted any hematuria but has noted difficulty with urination and general urinary hesitancy.  No dysuria or fevers.  Patient does report that since being in the emergency room he has urinated spontaneously multiple times.  denies chest pain, shortness of breath, recent illnesses.  No weakness or numbness of the lower extremities.  Currently he rates his pain at 10 out of 10.  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   Type 2 diabetes mellitus without complication, without long-term current use of insulin (HCC) 05/12/2020   Non compliance w medication regimen 05/12/2020   Alcoholism (HCC) 05/12/2020   Erectile disorder due to medical condition in male 05/12/2020   History of gout 05/12/2020   Other constipation 05/12/2020   Neuropathy 05/12/2020    Past Surgical History:  Procedure Laterality Date   APPENDECTOMY     INGUINAL HERNIA REPAIR  1990's   right       Family History  Problem Relation Age of Onset   Healthy Mother    Hypertension Father    Colon cancer Neg Hx     Social History   Tobacco Use   Smoking status: Never   Smokeless tobacco: Never   Substance Use Topics   Alcohol use: Yes    Alcohol/week: 22.0 - 24.0 standard drinks    Types: 14 Cans of beer, 8 - 10 Shots of liquor per week    Comment: reports drinks daily   Drug use: No    Home Medications Prior to Admission medications   Medication Sig Start Date End Date Taking? Authorizing Provider  acetaminophen (TYLENOL) 500 MG tablet Take 1 tablet (500 mg total) by mouth every 6 (six) hours as needed. 11/01/20  Yes Jozey Janco, Mayer Masker, MD  cyclobenzaprine (FLEXERIL) 5 MG tablet Take 1 tablet (5 mg total) by mouth 2 (two) times daily as needed for muscle spasms. 11/01/20  Yes Claretta Kendra, Mayer Masker, MD  allopurinol (ZYLOPRIM) 100 MG tablet Take 1 tablet (100 mg total) by mouth daily. 05/12/20   Mayers, Cari S, PA-C  amLODipine (NORVASC) 10 MG tablet Take 1 tablet (10 mg total) by mouth daily. 08/12/20   Grayce Sessions, NP  atorvastatin (LIPITOR) 20 MG tablet Take 1 tablet (20 mg total) by mouth daily. 05/12/20   Mayers, Cari S, PA-C  carvedilol (COREG) 6.25 MG tablet Take 1 tablet (6.25 mg total) by mouth 2 (two) times daily with a meal. 08/12/20   Grayce Sessions, NP  chlorthalidone (HYGROTON) 50 MG tablet Take 1 tablet (50 mg total) by mouth daily.  08/12/20   Grayce Sessions, NP  clotrimazole (LOTRIMIN) 1 % cream Apply to affected area 2 times daily 08/15/20   Linus Mako B, NP  Iron, Ferrous Sulfate, 325 (65 Fe) MG TABS Take 325 mg by mouth daily. 02/22/19   Grayce Sessions, NP  losartan (COZAAR) 100 MG tablet Take 1 tablet (100 mg total) by mouth daily. 08/12/20   Grayce Sessions, NP  metFORMIN (GLUCOPHAGE XR) 500 MG 24 hr tablet Take 1 tablet (500 mg total) by mouth daily with breakfast. 08/12/20   Grayce Sessions, NP  senna (SENOKOT) 8.6 MG TABS tablet Take 1 tablet (8.6 mg total) by mouth daily. 05/12/20   Mayers, Cari S, PA-C  sildenafil (VIAGRA) 100 MG tablet Take 0.5 tablets (50 mg total) by mouth daily as needed for erectile dysfunction. 05/12/20   Mayers,  Cari S, PA-C    Allergies    Lisinopril  Review of Systems   Review of Systems  Constitutional:  Negative for fever.  Respiratory:  Negative for shortness of breath.   Cardiovascular:  Negative for chest pain.  Gastrointestinal:  Negative for abdominal pain, nausea and vomiting.  Genitourinary:  Positive for difficulty urinating and flank pain.  Neurological:  Positive for light-headedness. Negative for dizziness, syncope and weakness.  All other systems reviewed and are negative.  Physical Exam Updated Vital Signs BP (!) 141/96   Pulse 88   Temp 97.9 F (36.6 C) (Oral)   Resp 18   SpO2 98%   Physical Exam Vitals and nursing note reviewed.  Constitutional:      Appearance: He is well-developed. He is not ill-appearing.  HENT:     Head: Normocephalic and atraumatic.     Nose: Nose normal.     Mouth/Throat:     Mouth: Mucous membranes are moist.  Eyes:     Pupils: Pupils are equal, round, and reactive to light.  Cardiovascular:     Rate and Rhythm: Normal rate and regular rhythm.     Heart sounds: Normal heart sounds. No murmur heard. Pulmonary:     Effort: Pulmonary effort is normal. No respiratory distress.     Breath sounds: Normal breath sounds. No wheezing.  Abdominal:     General: Bowel sounds are normal.     Palpations: Abdomen is soft.     Tenderness: There is no abdominal tenderness. There is no right CVA tenderness, left CVA tenderness or rebound.  Musculoskeletal:     Cervical back: Neck supple.     Comments: Tenderness to palpation over the left paraspinous muscle region of the lower lumbar spine, no step-off or deformity noted  Lymphadenopathy:     Cervical: No cervical adenopathy.  Skin:    General: Skin is warm and dry.  Neurological:     Mental Status: He is alert and oriented to person, place, and time.     Comments: 5 out of 5 strength bilateral lower extremity  Psychiatric:        Mood and Affect: Mood normal.    ED Results / Procedures /  Treatments   Labs (all labs ordered are listed, but only abnormal results are displayed) Labs Reviewed  URINALYSIS, ROUTINE W REFLEX MICROSCOPIC - Abnormal; Notable for the following components:      Result Value   Specific Gravity, Urine 1.004 (*)    Hgb urine dipstick SMALL (*)    All other components within normal limits  CBC WITH DIFFERENTIAL/PLATELET - Abnormal; Notable for the following components:  WBC 12.3 (*)    RBC 3.67 (*)    Hemoglobin 11.8 (*)    HCT 35.6 (*)    Neutro Abs 8.0 (*)    Monocytes Absolute 1.3 (*)    All other components within normal limits  BASIC METABOLIC PANEL - Abnormal; Notable for the following components:   Sodium 133 (*)    BUN 28 (*)    Creatinine, Ser 2.80 (*)    GFR, Estimated 25 (*)    All other components within normal limits    EKG EKG Interpretation  Date/Time:  Monday October 31 2020 23:12:08 EDT Ventricular Rate:  94 PR Interval:  188 QRS Duration: 100 QT Interval:  380 QTC Calculation: 475 R Axis:   15 Text Interpretation: Sinus rhythm with Premature atrial complexes Possible Anterior infarct , age undetermined Abnormal ECG Confirmed by Ross Marcus ([id number]) on 11/01/2020 6:22:29 AM  Radiology CT Renal Stone Study  Result Date: 11/01/2020 CLINICAL DATA:  60 year old male with history of flank pain and stiffness in the left lower back. Difficulty urinating. EXAM: CT ABDOMEN AND PELVIS WITHOUT CONTRAST TECHNIQUE: Multidetector CT imaging of the abdomen and pelvis was performed following the standard protocol without IV contrast. COMPARISON:  No priors. FINDINGS: Lower chest: Aortic atherosclerosis. Hepatobiliary: No definite suspicious cystic or solid hepatic lesions are confidently identified on today's noncontrast CT examination. Unenhanced appearance of the gallbladder is normal. Pancreas: No definite pancreatic mass or peripancreatic fluid collections or inflammatory changes are noted on today's noncontrast CT examination. Spleen:  Unremarkable. Adrenals/Urinary Tract: There are no abnormal calcifications within the collecting system of either kidney, along the course of either ureter, or within the lumen of the urinary bladder. No hydroureteronephrosis or perinephric stranding to suggest urinary tract obstruction at this time. The unenhanced appearance of the kidneys is unremarkable bilaterally. Unenhanced appearance of the kidneys, bilateral adrenal glands and urinary bladder is unremarkable. Stomach/Bowel: Unenhanced appearance of the stomach is normal. There is no pathologic dilatation of small bowel or colon. The appendix is not confidently identified and may be surgically absent. Regardless, there are no inflammatory changes noted adjacent to the cecum to suggest the presence of an acute appendicitis at this time. Vascular/Lymphatic: Aortic atherosclerosis. No lymphadenopathy noted in the abdomen or pelvis. Reproductive: Prostate gland and seminal vesicles are unremarkable in appearance. Other: Small umbilical hernia containing some omental fat and a short segment of mid small bowel. No significant volume of ascites. No pneumoperitoneum. Musculoskeletal: There are no aggressive appearing lytic or blastic lesions noted in the visualized portions of the skeleton. IMPRESSION: 1. No acute findings are noted in the abdomen or pelvis to account for the patient's symptoms. Specifically, no urinary tract calculi no findings of urinary tract obstruction. 2. Small umbilical hernia containing omental fat and a short segment of mid small bowel, without evidence of bowel incarceration or obstruction at this time. 3. Aortic atherosclerosis. Electronically Signed   By: Trudie Reed M.D.   On: 11/01/2020 06:26    Procedures Procedures   Medications Ordered in ED Medications - No data to display  ED Course  I have reviewed the triage vital signs and the nursing notes.  Pertinent labs & imaging results that were available during my care of  the patient were reviewed by me and considered in my medical decision making (see chart for details).    MDM Rules/Calculators/A&P  Patient presents with multiple complaints including lightheadedness, back pain, urinary symptoms.  Urinary symptoms have resolved.  He is afebrile and vital signs are reassuring.  He has some tenderness to palpation over the left lower lumbar paraspinous muscle region.  No evidence of sciatica.  No red flags or signs or symptoms of cauda equina.  Other considerations include kidney stone versus musculoskeletal etiology.  Lab work reviewed.  No evidence of urinary tract infection.  Patient is spontaneously voiding without difficulty.  CBC is largely unremarkable.  BMP with creatinine of 2.8.  Appears prior creatinines have fluctuated between mid ones and mid twos.  CT scan shows no evidence of kidney stone or other intra-abdominal pathology.  Suspect musculoskeletal etiology.  Recommend Tylenol and Flexeril.  Avoid heavy lifting.  Also recommend increasing hydration.  After history, exam, and medical workup I feel the patient has been appropriately medically screened and is safe for discharge home. Pertinent diagnoses were discussed with the patient. Patient was given return precautions.  Final Clinical Impression(s) / ED Diagnoses Final diagnoses:  Dizziness  Acute left-sided low back pain without sciatica    Rx / DC Orders ED Discharge Orders          Ordered    acetaminophen (TYLENOL) 500 MG tablet  Every 6 hours PRN        11/01/20 0638    cyclobenzaprine (FLEXERIL) 5 MG tablet  2 times daily PRN        11/01/20 0639             Shon BatonHorton, Sadey Yandell F, MD 11/01/20 [phone]

## 2020-11-01 NOTE — ED Notes (Signed)
Patient transported to CT 

## 2020-11-02 LAB — URINE CULTURE: Culture: NO GROWTH

## 2020-11-09 ENCOUNTER — Other Ambulatory Visit: Payer: Self-pay

## 2020-11-09 ENCOUNTER — Ambulatory Visit (INDEPENDENT_AMBULATORY_CARE_PROVIDER_SITE_OTHER): Payer: BC Managed Care – PPO | Admitting: Primary Care

## 2020-11-09 ENCOUNTER — Other Ambulatory Visit (INDEPENDENT_AMBULATORY_CARE_PROVIDER_SITE_OTHER): Payer: Self-pay | Admitting: Primary Care

## 2020-11-09 ENCOUNTER — Encounter (INDEPENDENT_AMBULATORY_CARE_PROVIDER_SITE_OTHER): Payer: Self-pay | Admitting: Primary Care

## 2020-11-09 VITALS — BP 137/97 | HR 94 | Temp 97.5°F | Ht 70.0 in | Wt 203.8 lb

## 2020-11-09 DIAGNOSIS — Z760 Encounter for issue of repeat prescription: Secondary | ICD-10-CM

## 2020-11-09 DIAGNOSIS — N521 Erectile dysfunction due to diseases classified elsewhere: Secondary | ICD-10-CM

## 2020-11-09 DIAGNOSIS — M5440 Lumbago with sciatica, unspecified side: Secondary | ICD-10-CM

## 2020-11-09 DIAGNOSIS — E119 Type 2 diabetes mellitus without complications: Secondary | ICD-10-CM

## 2020-11-09 DIAGNOSIS — I10 Essential (primary) hypertension: Secondary | ICD-10-CM

## 2020-11-09 MED ORDER — TADALAFIL 10 MG PO TABS: 10.0000 mg | ORAL_TABLET | ORAL | 1 refills | Status: DC | PRN

## 2020-11-09 MED ORDER — CYCLOBENZAPRINE HCL 5 MG PO TABS: 5.0000 mg | ORAL_TABLET | Freq: Two times a day (BID) | ORAL | 0 refills | Status: DC | PRN

## 2020-11-09 MED ORDER — METFORMIN HCL ER 500 MG PO TB24: 500.0000 mg | ORAL_TABLET | Freq: Every day | ORAL | 1 refills | Status: DC

## 2020-11-09 MED ORDER — LOSARTAN POTASSIUM 100 MG PO TABS
100.0000 mg | ORAL_TABLET | Freq: Every day | ORAL | 1 refills | Status: DC
Start: 2020-11-09 — End: 2021-06-01

## 2020-11-09 MED ORDER — CARVEDILOL 6.25 MG PO TABS: 6.2500 mg | ORAL_TABLET | Freq: Two times a day (BID) | ORAL | 1 refills | Status: DC

## 2020-11-09 MED ORDER — AMLODIPINE BESYLATE 10 MG PO TABS: 10.0000 mg | ORAL_TABLET | Freq: Every day | ORAL | 1 refills | Status: DC

## 2020-11-09 MED ORDER — CHLORTHALIDONE 50 MG PO TABS: 50.0000 mg | ORAL_TABLET | Freq: Every day | ORAL | 1 refills | Status: DC

## 2020-11-09 NOTE — Progress Notes (Signed)
[facility]  Subjective: CC: back pain PCP: Grayce Sessions, NP HPI: Patient is a 60 y.o. male presenting to clinic today for back pain. Concerns today include:  1. Back Pain Patient reports that pain began 2 months a sharpe pain ran through his back at work.  No h/o back pain.  Pain is a 8.5/10.  It does not  radiate.  Bending, lifting and walking up steps  worsens pain.  Heating pad laying down  improves pain.  Patient has been taking tylenol 500 mg 1 Q 6 hrs and muscle relaxer  for pain with good relief.  Patient denies trauma or injury.  Denies  dysuria, hematuria, fevers, chills, nausea, vomiting, abdominal pain, renal stones. Aggravating factors: certain movements and prolonged walking/standing. Alleviating factors: rest.  No progressive LE weakness or saddle anesthesia: none. Extremity sensation changes or weakness: none. Ambulatory without difficulty. Normal bowel/bladder habits: yes; without urinary retention. Normal PO intake without n/v. No associated abdominal pain/n/v. Self treatment: has OTC analgesics, with minimal relief. Patient denies: urinary retention/incontinence, bowel incontinence, weakness, falls, sensation changes or pain anywhere else. No h/o back surgeries.    Current Outpatient Medications:    acetaminophen (TYLENOL) 500 MG tablet, Take 1 tablet (500 mg total) by mouth every 6 (six) hours as needed., Disp: 30 tablet, Rfl: 0   allopurinol (ZYLOPRIM) 100 MG tablet, Take 1 tablet (100 mg total) by mouth daily., Disp: 90 tablet, Rfl: 1   amLODipine (NORVASC) 10 MG tablet, Take 1 tablet (10 mg total) by mouth daily., Disp: 90 tablet, Rfl: 1   atorvastatin (LIPITOR) 20 MG tablet, Take 1 tablet (20 mg total) by mouth daily., Disp: 90 tablet, Rfl: 1   carvedilol (COREG) 6.25 MG tablet, Take 1 tablet (6.25 mg total) by mouth 2 (two) times daily with a meal., Disp: 180 tablet, Rfl: 1   chlorthalidone (HYGROTON) 50 MG tablet, Take 1 tablet (50 mg total) by  mouth daily., Disp: 90 tablet, Rfl: 1   clotrimazole (LOTRIMIN) 1 % cream, Apply to affected area 2 times daily, Disp: 15 g, Rfl: 0   cyclobenzaprine (FLEXERIL) 5 MG tablet, Take 1 tablet (5 mg total) by mouth 2 (two) times daily as needed for muscle spasms., Disp: 10 tablet, Rfl: 0   Iron, Ferrous Sulfate, 325 (65 Fe) MG TABS, Take 325 mg by mouth daily., Disp: 30 tablet, Rfl: 11   losartan (COZAAR) 100 MG tablet, Take 1 tablet (100 mg total) by mouth daily., Disp: 90 tablet, Rfl: 1   metFORMIN (GLUCOPHAGE XR) 500 MG 24 hr tablet, Take 1 tablet (500 mg total) by mouth daily with breakfast., Disp: 90 tablet, Rfl: 1   senna (SENOKOT) 8.6 MG TABS tablet, Take 1 tablet (8.6 mg total) by mouth daily., Disp: 120 tablet, Rfl: 0   sildenafil (VIAGRA) 100 MG tablet, Take 0.5 tablets (50 mg total) by mouth daily as needed for erectile dysfunction., Disp: 5 tablet, Rfl: 3 Allergies  Allergen Reactions   Lisinopril Cough    Past Medical History:  Diagnosis Date   Dizziness    Gout    Hypertension    Social History   Socioeconomic History   Marital status: Legally Separated    Spouse name: Not on file   Number of children: Not on file   Years of education: Not on file   Highest education level: Not on file  Occupational History   Not on file  Tobacco Use   Smoking status: Never   Smokeless tobacco: Never  Substance and Sexual Activity   Alcohol use: Yes    Alcohol/week: 22.0 - 24.0 standard drinks    Types: 14 Cans of beer, 8 - 10 Shots of liquor per week    Comment: reports drinks daily   Drug use: No   Sexual activity: Not Currently  Other Topics Concern   Not on file  Social History Narrative   Not on file   Social Determinants of Health   Financial Resource Strain: Not on file  Food Insecurity: Not on file  Transportation Needs: Not on file  Physical Activity: Not on file  Stress: Not on file  Social Connections: Not on file  Intimate Partner Violence: Not on file   Past  Surgical History:  Procedure Laterality Date   APPENDECTOMY     INGUINAL HERNIA REPAIR  1990's   right    ROS: per HPI  Objective: Office vital signs reviewed.  Physical Examination:  General: Awake, alert, oriented overweight male  nourished, NAD Cardio: Regular rate and rhythm, S1S2 heard, no murmurs appreciated Pulm: Clear to auscultation bilaterally, no wheezes, rhonchi or rales Extremities: Warm, well-perfused. No edema, cyanosis or clubbing; + pulses bilaterally MSK: normal gait and station  No curvature/ kyphosis   Spine:  AROM, no midline tenderness to palpation, no paraspinal tenderness to palpation.  No palpable bony deformities,  negative straight leg test Neuro: 5/5 lower extremity strength; lower extremity light touch sensation grossly intact,   Assessment/ Plan: Tony Dean was seen today for back pain.  Diagnoses and all orders for this visit:  Erectile disorder due to medical condition in male Viagra was ineffective d/c with elevated Bp and changed to cialis if ineffective agreed on urology referral  -     tadalafil (CIALIS) 10 MG tablet; Take 1 tablet (10 mg total) by mouth every other day as needed for erectile dysfunction.  Bilateral low back pain with sciatica, sciatica laterality unspecified, unspecified chronicity Continue tylenol and refill antispasmodic if no improvement will refer to ortho  Hypertension, unspecified type Elevated at this visit out of Bp medications Counseled on blood pressure goal of less than 130/80, low-sodium, DASH diet, medication compliance, 150 minutes of moderate intensity exercise per week. Discussed medication compliance, adverse effects.  -     carvedilol (COREG) 6.25 MG tablet; Take 1 tablet (6.25 mg total) by mouth 2 (two) times daily with a meal. -     amLODipine (NORVASC) 10 MG tablet; Take 1 tablet (10 mg total) by mouth daily. -     chlorthalidone (HYGROTON) 50 MG tablet; Take 1 tablet (50 mg total) by mouth daily. -      losartan (COZAAR) 100 MG tablet; Take 1 tablet (100 mg total) by mouth daily.  Medication refill -     carvedilol (COREG) 6.25 MG tablet; Take 1 tablet (6.25 mg total) by mouth 2 (two) times daily with a meal. -     amLODipine (NORVASC) 10 MG tablet; Take 1 tablet (10 mg total) by mouth daily. -     chlorthalidone (HYGROTON) 50 MG tablet; Take 1 tablet (50 mg total) by mouth daily. -     losartan (COZAAR) 100 MG tablet; Take 1 tablet (100 mg total) by mouth daily.  Type 2 diabetes mellitus without complication, without long-term current use of insulin (HCC) -     metFORMIN (GLUCOPHAGE XR) 500 MG 24 hr tablet; Take 1 tablet (500 mg total) by mouth daily with breakfast.   The above assessment and management plan was discussed with  the patient. The patient verbalized understanding of and has agreed to the management plan. Patient is aware to call the clinic if symptoms persist or worsen. Patient is aware when to return to the clinic for a follow-up visit. Patient educated on when it is appropriate to go to the emergency department.   This note has been created with Education officer, environmental. Any transcriptional errors are unintentional.   Grayce Sessions, NP 11/09/2020, 1:36 PM

## 2020-11-29 ENCOUNTER — Encounter (INDEPENDENT_AMBULATORY_CARE_PROVIDER_SITE_OTHER): Payer: Self-pay | Admitting: Nurse Practitioner

## 2020-11-29 ENCOUNTER — Other Ambulatory Visit: Payer: Self-pay

## 2020-11-29 ENCOUNTER — Ambulatory Visit (INDEPENDENT_AMBULATORY_CARE_PROVIDER_SITE_OTHER): Payer: BC Managed Care – PPO | Admitting: Nurse Practitioner

## 2020-11-29 VITALS — BP 145/96 | HR 90 | Temp 97.5°F | Ht 70.0 in | Wt 207.8 lb

## 2020-11-29 DIAGNOSIS — R9431 Abnormal electrocardiogram [ECG] [EKG]: Secondary | ICD-10-CM

## 2020-11-29 NOTE — Progress Notes (Signed)
Subjective:    Patient here for follow-up of elevated blood pressure.  He is not exercising and is adherent to a low-salt diet.  Blood pressure  unknown if  well controlled at home. Cardiac symptoms: none. Patient denies: chest pain, chest pressure/discomfort, dyspnea, fatigue, irregular heart beat, palpitations, syncope, and tachypnea. Cardiovascular risk factors: advanced age (older than 10 for men, 82 for women), diabetes mellitus, dyslipidemia, hypertension, and male gender. Use of agents associated with hypertension: none. History of target organ damage: none.    Review of Systems Review of Systems  Constitutional: Negative.   HENT: Negative.    Eyes: Negative.   Respiratory: Negative.    Cardiovascular: Negative.   Gastrointestinal: Negative.   Genitourinary: Negative.   Musculoskeletal: Negative.   Skin: Negative.   Neurological: Negative.   Endo/Heme/Allergies: Negative.   Psychiatric/Behavioral: Negative.        Objective:    Physical Exam Constitutional:      General: He is not in acute distress. Cardiovascular:     Rate and Rhythm: Normal rate and regular rhythm.  Pulmonary:     Effort: Pulmonary effort is normal.     Breath sounds: Normal breath sounds.  Musculoskeletal:     Right lower leg: No edema.     Left lower leg: No edema.  Skin:    General: Skin is warm and dry.  Neurological:     Mental Status: He is alert and oriented to person, place, and time.  Psychiatric:        Mood and Affect: Affect normal.      Assessment:    Hypertension,  slightly elevated BP in office today . Evidence of target organ damage:  kidney .    Plan:    Medication: no change. Dietary sodium restriction. Regular aerobic exercise. Check blood pressures daily and record. Follow up: 3 months and as needed.

## 2020-11-29 NOTE — Patient Instructions (Addendum)
Hypertension:  Please continue current medications  Will place referral to cardiology  Follow up:  Follow up in 3 months or sooner if needed  Hypertension, Adult Hypertension is another name for high blood pressure. High blood pressure forces your heart to work harder to pump blood. This can cause problems overtime. There are two numbers in a blood pressure reading. There is a top number (systolic) over a bottom number (diastolic). It is best to have a blood pressure that is below 120/80. Healthy choicescan help lower your blood pressure, or you may need medicine to help lower it. What are the causes? The cause of this condition is not known. Some conditions may be related tohigh blood pressure. What increases the risk? Smoking. Having type 2 diabetes mellitus, high cholesterol, or both. Not getting enough exercise or physical activity. Being overweight. Having too much fat, sugar, calories, or salt (sodium) in your diet. Drinking too much alcohol. Having long-term (chronic) kidney disease. Having a family history of high blood pressure. Age. Risk increases with age. Race. You may be at higher risk if you are African American. Gender. Men are at higher risk than women before age 53. After age 52, women are at higher risk than men. Having obstructive sleep apnea. Stress. What are the signs or symptoms? High blood pressure may not cause symptoms. Very high blood pressure (hypertensive crisis) may cause: Headache. Feelings of worry or nervousness (anxiety). Shortness of breath. Nosebleed. A feeling of being sick to your stomach (nausea). Throwing up (vomiting). Changes in how you see. Very bad chest pain. Seizures. How is this treated? This condition is treated by making healthy lifestyle changes, such as: Eating healthy foods. Exercising more. Drinking less alcohol. Your health care provider may prescribe medicine if lifestyle changes are not enough to get your blood  pressure under control, and if: Your top number is above 130. Your bottom number is above 80. Your personal target blood pressure may vary. Follow these instructions at home: Eating and drinking  If told, follow the DASH eating plan. To follow this plan: Fill one half of your plate at each meal with fruits and vegetables. Fill one fourth of your plate at each meal with whole grains. Whole grains include whole-wheat pasta, brown rice, and whole-grain bread. Eat or drink low-fat dairy products, such as skim milk or low-fat yogurt. Fill one fourth of your plate at each meal with low-fat (lean) proteins. Low-fat proteins include fish, chicken without skin, eggs, beans, and tofu. Avoid fatty meat, cured and processed meat, or chicken with skin. Avoid pre-made or processed food. Eat less than 1,500 mg of salt each day. Do not drink alcohol if: Your doctor tells you not to drink. You are pregnant, may be pregnant, or are planning to become pregnant. If you drink alcohol: Limit how much you use to: 0-1 drink a day for women. 0-2 drinks a day for men. Be aware of how much alcohol is in your drink. In the [place] one drink equals one 12 oz bottle of beer (355 mL), one 5 oz glass of wine (148 mL), or one 1 oz glass of hard liquor (44 mL).  Lifestyle  Work with your doctor to stay at a healthy weight or to lose weight. Ask your doctor what the best weight is for you. Get at least 30 minutes of exercise most days of the week. This may include walking, swimming, or biking. Get at least 30 minutes of exercise that strengthens your muscles (resistance exercise) at  least 3 days a week. This may include lifting weights or doing Pilates. Do not use any products that contain nicotine or tobacco, such as cigarettes, e-cigarettes, and chewing tobacco. If you need help quitting, ask your doctor. Check your blood pressure at home as told by your doctor. Keep all follow-up visits as told by your doctor. This is  important.  Medicines Take over-the-counter and prescription medicines only as told by your doctor. Follow directions carefully. Do not skip doses of blood pressure medicine. The medicine does not work as well if you skip doses. Skipping doses also puts you at risk for problems. Ask your doctor about side effects or reactions to medicines that you should watch for. Contact a doctor if you: Think you are having a reaction to the medicine you are taking. Have headaches that keep coming back (recurring). Feel dizzy. Have swelling in your ankles. Have trouble with your vision. Get help right away if you: Get a very bad headache. Start to feel mixed up (confused). Feel weak or numb. Feel faint. Have very bad pain in your: Chest. Belly (abdomen). Throw up more than once. Have trouble breathing. Summary Hypertension is another name for high blood pressure. High blood pressure forces your heart to work harder to pump blood. For most people, a normal blood pressure is less than 120/80. Making healthy choices can help lower blood pressure. If your blood pressure does not get lower with healthy choices, you may need to take medicine. This information is not intended to replace advice given to you by your health care provider. Make sure you discuss any questions you have with your healthcare provider. Document Revised: 12/18/2017 Document Reviewed: 12/18/2017 Elsevier Patient Education  2022 [employer]  Managing Your Hypertension Hypertension, also called high blood pressure, is when the force of the blood pressing against the walls of the arteries is too strong. Arteries are blood vessels that carry blood from your heart throughout your body. Hypertension forces the heart to work harder to pump blood and may cause the arteries tobecome narrow or stiff. Understanding blood pressure readings Your personal target blood pressure may vary depending on your medical conditions, your age, and  other factors. A blood pressure reading includes a higher number over a lower number. Ideally, your blood pressure should be below 120/80. You should know that: The first, or top, number is called the systolic pressure. It is a measure of the pressure in your arteries as your heart beats. The second, or bottom number, is called the diastolic pressure. It is a measure of the pressure in your arteries as the heart relaxes. Blood pressure is classified into four stages. Based on your blood pressure reading, your health care provider may use the following stages to determine what type of treatment you need, if any. Systolic pressure and diastolicpressure are measured in a unit called mmHg. Normal Systolic pressure: below 120. Diastolic pressure: below 80. Elevated Systolic pressure: 120-129. Diastolic pressure: below 80. Hypertension stage 1 Systolic pressure: 130-139. Diastolic pressure: 80-89. Hypertension stage 2 Systolic pressure: 140 or above. Diastolic pressure: 90 or above. How can this condition affect me? Managing your hypertension is an important responsibility. Over time, hypertension can damage the arteries and decrease blood flow to important parts of the body, including the brain, heart, and kidneys. Having untreated or uncontrolled hypertension can lead to: A heart attack. A stroke. A weakened blood vessel (aneurysm). Heart failure. Kidney damage. Eye damage. Metabolic syndrome. Memory and concentration problems. Vascular dementia. What actions  can I take to manage this condition? Hypertension can be managed by making lifestyle changes and possibly by taking medicines. Your health care provider will help you make a plan to bring yourblood pressure within a normal range. Nutrition  Eat a diet that is high in fiber and potassium, and low in salt (sodium), added sugar, and fat. An example eating plan is called the Dietary Approaches to Stop Hypertension (DASH) diet. To eat this  way: Eat plenty of fresh fruits and vegetables. Try to fill one-half of your plate at each meal with fruits and vegetables. Eat whole grains, such as whole-wheat pasta, brown rice, or whole-grain bread. Fill about one-fourth of your plate with whole grains. Eat low-fat dairy products. Avoid fatty cuts of meat, processed or cured meats, and poultry with skin. Fill about one-fourth of your plate with lean proteins such as fish, chicken without skin, beans, eggs, and tofu. Avoid pre-made and processed foods. These tend to be higher in sodium, added sugar, and fat. Reduce your daily sodium intake. Most people with hypertension should eat less than 1,500 mg of sodium a day.  Lifestyle  Work with your health care provider to maintain a healthy body weight or to lose weight. Ask what an ideal weight is for you. Get at least 30 minutes of exercise that causes your heart to beat faster (aerobic exercise) most days of the week. Activities may include walking, swimming, or biking. Include exercise to strengthen your muscles (resistance exercise), such as weight lifting, as part of your weekly exercise routine. Try to do these types of exercises for 30 minutes at least 3 days a week. Do not use any products that contain nicotine or tobacco, such as cigarettes, e-cigarettes, and chewing tobacco. If you need help quitting, ask your health care provider. Control any long-term (chronic) conditions you have, such as high cholesterol or diabetes. Identify your sources of stress and find ways to manage stress. This may include meditation, deep breathing, or making time for fun activities.  Alcohol use Do not drink alcohol if: Your health care provider tells you not to drink. You are pregnant, may be pregnant, or are planning to become pregnant. If you drink alcohol: Limit how much you use to: 0-1 drink a day for women. 0-2 drinks a day for men. Be aware of how much alcohol is in your drink. In the [place] one  drink equals one 12 oz bottle of beer (355 mL), one 5 oz glass of wine (148 mL), or one 1 oz glass of hard liquor (44 mL). Medicines Your health care provider may prescribe medicine if lifestyle changes are not enough to get your blood pressure under control and if: Your systolic blood pressure is 130 or higher. Your diastolic blood pressure is 80 or higher. Take medicines only as told by your health care provider. Follow the directions carefully. Blood pressure medicines must be taken as told by your health care provider. The medicine does not work as well when you skip doses. Skippingdoses also puts you at risk for problems. Monitoring Before you monitor your blood pressure: Do not smoke, drink caffeinated beverages, or exercise within 30 minutes before taking a measurement. Use the bathroom and empty your bladder (urinate). Sit quietly for at least 5 minutes before taking measurements. Monitor your blood pressure at home as told by your health care provider. To do this: Sit with your back straight and supported. Place your feet flat on the floor. Do not cross your legs. Support your  arm on a flat surface, such as a table. Make sure your upper arm is at heart level. Each time you measure, take two or three readings one minute apart and record the results. You may also need to have your blood pressure checked regularly by your healthcare provider. General information Talk with your health care provider about your diet, exercise habits, and other lifestyle factors that may be contributing to hypertension. Review all the medicines you take with your health care provider because there may be side effects or interactions. Keep all visits as told by your health care provider. Your health care provider can help you create and adjust your plan for managing your high blood pressure. Where to find more information National Heart, Lung, and Blood Institute: PopSteam.iswww.nhlbi.nih.gov American Heart  Association: www.heart.org Contact a health care provider if: You think you are having a reaction to medicines you have taken. You have repeated (recurrent) headaches. You feel dizzy. You have swelling in your ankles. You have trouble with your vision. Get help right away if: You develop a severe headache or confusion. You have unusual weakness or numbness, or you feel faint. You have severe pain in your chest or abdomen. You vomit repeatedly. You have trouble breathing. These symptoms may represent a serious problem that is an emergency. Do not wait to see if the symptoms will go away. Get medical help right away. Call your local emergency services (911 in the [place] Do not drive yourself to the hospital. Summary Hypertension is when the force of blood pumping through your arteries is too strong. If this condition is not controlled, it may put you at risk for serious complications. Your personal target blood pressure may vary depending on your medical conditions, your age, and other factors. For most people, a normal blood pressure is less than 120/80. Hypertension is managed by lifestyle changes, medicines, or both. Lifestyle changes to help manage hypertension include losing weight, eating a healthy, low-sodium diet, exercising more, stopping smoking, and limiting alcohol. This information is not intended to replace advice given to you by your health care provider. Make sure you discuss any questions you have with your healthcare provider. Document Revised: 05/15/2019 Document Reviewed: 03/10/2019 Elsevier Patient Education  2022 [employer]  https://www.mata.com/https://www.nhlbi.nih.gov/files/docs/public/heart/dash_brief.pdf">  DASH Eating Plan DASH stands for Dietary Approaches to Stop Hypertension. The DASH eating plan is a healthy eating plan that has been shown to: Reduce high blood pressure (hypertension). Reduce your risk for type 2 diabetes, heart disease, and stroke. Help with weight  loss. What are tips for following this plan? Reading food labels Check food labels for the amount of salt (sodium) per serving. Choose foods with less than 5 percent of the Daily Value of sodium. Generally, foods with less than 300 milligrams (mg) of sodium per serving fit into this eating plan. To find whole grains, look for the word "whole" as the first word in the ingredient list. Shopping Buy products labeled as "low-sodium" or "no salt added." Buy fresh foods. Avoid canned foods and pre-made or frozen meals. Cooking Avoid adding salt when cooking. Use salt-free seasonings or herbs instead of table salt or sea salt. Check with your health care provider or pharmacist before using salt substitutes. Do not fry foods. Cook foods using healthy methods such as baking, boiling, grilling, roasting, and broiling instead. Cook with heart-healthy oils, such as olive, canola, avocado, soybean, or sunflower oil. Meal planning  Eat a balanced diet that includes: 4 or more servings of fruits and 4  or more servings of vegetables each day. Try to fill one-half of your plate with fruits and vegetables. 6-8 servings of whole grains each day. Less than 6 oz (170 g) of lean meat, poultry, or fish each day. A 3-oz (85-g) serving of meat is about the same size as a deck of cards. One egg equals 1 oz (28 g). 2-3 servings of low-fat dairy each day. One serving is 1 cup (237 mL). 1 serving of nuts, seeds, or beans 5 times each week. 2-3 servings of heart-healthy fats. Healthy fats called omega-3 fatty acids are found in foods such as walnuts, flaxseeds, fortified milks, and eggs. These fats are also found in cold-water fish, such as sardines, salmon, and mackerel. Limit how much you eat of: Canned or prepackaged foods. Food that is high in trans fat, such as some fried foods. Food that is high in saturated fat, such as fatty meat. Desserts and other sweets, sugary drinks, and other foods with added  sugar. Full-fat dairy products. Do not salt foods before eating. Do not eat more than 4 egg yolks a week. Try to eat at least 2 vegetarian meals a week. Eat more home-cooked food and less restaurant, buffet, and fast food.  Lifestyle When eating at a restaurant, ask that your food be prepared with less salt or no salt, if possible. If you drink alcohol: Limit how much you use to: 0-1 drink a day for women who are not pregnant. 0-2 drinks a day for men. Be aware of how much alcohol is in your drink. In the [place] one drink equals one 12 oz bottle of beer (355 mL), one 5 oz glass of wine (148 mL), or one 1 oz glass of hard liquor (44 mL). General information Avoid eating more than 2,300 mg of salt a day. If you have hypertension, you may need to reduce your sodium intake to 1,500 mg a day. Work with your health care provider to maintain a healthy body weight or to lose weight. Ask what an ideal weight is for you. Get at least 30 minutes of exercise that causes your heart to beat faster (aerobic exercise) most days of the week. Activities may include walking, swimming, or biking. Work with your health care provider or dietitian to adjust your eating plan to your individual calorie needs. What foods should I eat? Fruits All fresh, dried, or frozen fruit. Canned fruit in natural juice (without addedsugar). Vegetables Fresh or frozen vegetables (raw, steamed, roasted, or grilled). Low-sodium or reduced-sodium tomato and vegetable juice. Low-sodium or reduced-sodium tomatosauce and tomato paste. Low-sodium or reduced-sodium canned vegetables. Grains Whole-grain or whole-wheat bread. Whole-grain or whole-wheat pasta. Brown rice. Orpah Cobb. Bulgur. Whole-grain and low-sodium cereals. Pita bread.Low-fat, low-sodium crackers. Whole-wheat flour tortillas. Meats and other proteins Skinless chicken or [place]. Ground chicken or [place]. Pork with fat trimmed off. Fish and seafood. Egg whites. Dried  beans, peas, or lentils. Unsalted nuts, nut butters, and seeds. Unsalted canned beans. Lean cuts of beef with fat trimmed off. Low-sodium, lean precooked or cured meat, such as sausages or meatloaves. Dairy Low-fat (1%) or fat-free (skim) milk. Reduced-fat, low-fat, or fat-free cheeses. Nonfat, low-sodium ricotta or cottage cheese. Low-fat or nonfatyogurt. Low-fat, low-sodium cheese. Fats and oils Soft margarine without trans fats. Vegetable oil. Reduced-fat, low-fat, or light mayonnaise and salad dressings (reduced-sodium). Canola, safflower, olive, avocado, soybean, andsunflower oils. Avocado. Seasonings and condiments Herbs. Spices. Seasoning mixes without salt. Other foods Unsalted popcorn and pretzels. Fat-free sweets. The items listed above may  not be a complete list of foods and beverages you can eat. Contact a dietitian for more information. What foods should I avoid? Fruits Canned fruit in a light or heavy syrup. Fried fruit. Fruit in cream or buttersauce. Vegetables Creamed or fried vegetables. Vegetables in a cheese sauce. Regular canned vegetables (not low-sodium or reduced-sodium). Regular canned tomato sauce and paste (not low-sodium or reduced-sodium). Regular tomato and vegetable juice(not low-sodium or reduced-sodium). Rosita Fire. Olives. Grains Baked goods made with fat, such as croissants, muffins, or some breads. Drypasta or rice meal packs. Meats and other proteins Fatty cuts of meat. Ribs. Fried meat. Tomasa Blase. Bologna, salami, and other precooked or cured meats, such as sausages or meat loaves. Fat from the back of a pig (fatback). Bratwurst. Salted nuts and seeds. Canned beans with added salt. Canned orsmoked fish. Whole eggs or egg yolks. Chicken or [place] with skin. Dairy Whole or 2% milk, cream, and half-and-half. Whole or full-fat cream cheese. Whole-fat or sweetened yogurt. Full-fat cheese. Nondairy creamers. Whippedtoppings. Processed cheese and cheese spreads. Fats and  oils Butter. Stick margarine. Lard. Shortening. Ghee. Bacon fat. Tropical oils, suchas coconut, palm kernel, or palm oil. Seasonings and condiments Onion salt, garlic salt, seasoned salt, table salt, and sea salt. Worcestershire sauce. Tartar sauce. Barbecue sauce. Teriyaki sauce. Soy sauce, including reduced-sodium. Steak sauce. Canned and packaged gravies. Fish sauce. Oyster sauce. Cocktail sauce. Store-bought horseradish. Ketchup. Mustard. Meat flavorings and tenderizers. Bouillon cubes. Hot sauces. Pre-made or packaged marinades. Pre-made or packaged taco seasonings. Relishes. Regular saladdressings. Other foods Salted popcorn and pretzels. The items listed above may not be a complete list of foods and beverages you should avoid. Contact a dietitian for more information. Where to find more information National Heart, Lung, and Blood Institute: PopSteam.is American Heart Association: www.heart.org Academy of Nutrition and Dietetics: www.eatright.org National Kidney Foundation: www.kidney.org Summary The DASH eating plan is a healthy eating plan that has been shown to reduce high blood pressure (hypertension). It may also reduce your risk for type 2 diabetes, heart disease, and stroke. When on the DASH eating plan, aim to eat more fresh fruits and vegetables, whole grains, lean proteins, low-fat dairy, and heart-healthy fats. With the DASH eating plan, you should limit salt (sodium) intake to 2,300 mg a day. If you have hypertension, you may need to reduce your sodium intake to 1,500 mg a day. Work with your health care provider or dietitian to adjust your eating plan to your individual calorie needs. This information is not intended to replace advice given to you by your health care provider. Make sure you discuss any questions you have with your healthcare provider. Document Revised: 03/13/2019 Document Reviewed: 03/13/2019 Elsevier Patient Education  2022 [employer].

## 2021-02-20 ENCOUNTER — Ambulatory Visit: Payer: BC Managed Care – PPO | Admitting: Cardiovascular Disease

## 2021-02-20 ENCOUNTER — Encounter: Payer: Self-pay | Admitting: Cardiovascular Disease

## 2021-02-20 ENCOUNTER — Other Ambulatory Visit: Payer: Self-pay

## 2021-02-20 VITALS — BP 110/84 | HR 90 | Ht 70.0 in | Wt 199.8 lb

## 2021-02-20 DIAGNOSIS — I10 Essential (primary) hypertension: Secondary | ICD-10-CM | POA: Diagnosis not present

## 2021-02-20 DIAGNOSIS — R9431 Abnormal electrocardiogram [ECG] [EKG]: Secondary | ICD-10-CM

## 2021-02-20 NOTE — Progress Notes (Signed)
Chief Complaint  Patient presents with   New Patient (Initial Visit)    Abnormal EKG      History of Present Illness: 60 yo male wit history of diabetes mellitus, gout , HTN who is here today as a new consult, referred by Gwinda Passe, NP for the evaluation of an abnormal EKG. EKG in July 2022 with Sinus, PACs, poor R wave progression in the precordial leads. His HTN is managed in primary care. BP is well controlled today (100/84). He tells me that he feels well overall but has occasional dizziness. No chest pain, dyspnea or LE edema.   Primary Care Physician: Grayce Sessions, NP   Past Medical History:  Diagnosis Date   Abnormal electrocardiogram (ECG) (EKG)    Dehydration    Dizziness    DM (diabetes mellitus) (HCC)    ED (erectile dysfunction)    Gout    Hypertension    Lymphangitis of groin    Scrotal pain    Sprain of left wrist    Urinary retention     Past Surgical History:  Procedure Laterality Date   APPENDECTOMY     INGUINAL HERNIA REPAIR  1990's   right    Current Outpatient Medications  Medication Sig Dispense Refill   acetaminophen (TYLENOL) 500 MG tablet Take 1 tablet (500 mg total) by mouth every 6 (six) hours as needed. 30 tablet 0   allopurinol (ZYLOPRIM) 100 MG tablet Take 1 tablet (100 mg total) by mouth daily. 90 tablet 1   amLODipine (NORVASC) 10 MG tablet Take 1 tablet (10 mg total) by mouth daily. 90 tablet 1   atorvastatin (LIPITOR) 20 MG tablet Take 1 tablet (20 mg total) by mouth daily. 90 tablet 1   carvedilol (COREG) 6.25 MG tablet Take 1 tablet (6.25 mg total) by mouth 2 (two) times daily with a meal. 180 tablet 1   chlorthalidone (HYGROTON) 50 MG tablet Take 1 tablet (50 mg total) by mouth daily. 90 tablet 1   clotrimazole (LOTRIMIN) 1 % cream Apply to affected area 2 times daily 15 g 0   cyclobenzaprine (FLEXERIL) 5 MG tablet Take 1 tablet (5 mg total) by mouth 2 (two) times daily as needed for muscle spasms. 30 tablet 0   Iron,  Ferrous Sulfate, 325 (65 Fe) MG TABS Take 325 mg by mouth daily. 30 tablet 11   losartan (COZAAR) 100 MG tablet Take 1 tablet (100 mg total) by mouth daily. 90 tablet 1   metFORMIN (GLUCOPHAGE XR) 500 MG 24 hr tablet Take 1 tablet (500 mg total) by mouth daily with breakfast. 90 tablet 1   senna (SENOKOT) 8.6 MG TABS tablet Take 1 tablet (8.6 mg total) by mouth daily. 120 tablet 0   tadalafil (CIALIS) 10 MG tablet Take 1 tablet (10 mg total) by mouth every other day as needed for erectile dysfunction. 10 tablet 1   No current facility-administered medications for this visit.    Allergies  Allergen Reactions   Lisinopril Cough    Social History   Socioeconomic History   Marital status: Legally Separated    Spouse name: Not on file   Number of children: Not on file   Years of education: Not on file   Highest education level: Not on file  Occupational History   Not on file  Tobacco Use   Smoking status: Never   Smokeless tobacco: Never  Substance and Sexual Activity   Alcohol use: Yes    Alcohol/week: 22.0 - 24.0  standard drinks    Types: 14 Cans of beer, 8 - 10 Shots of liquor per week    Comment: reports drinks daily   Drug use: No   Sexual activity: Not Currently  Other Topics Concern   Not on file  Social History Narrative   Not on file   Social Determinants of Health   Financial Resource Strain: Not on file  Food Insecurity: Not on file  Transportation Needs: Not on file  Physical Activity: Not on file  Stress: Not on file  Social Connections: Not on file  Intimate Partner Violence: Not on file    Family History  Problem Relation Age of Onset   Healthy Mother    Hypertension Father    Colon cancer Neg Hx     Review of Systems:  As stated in the HPI and otherwise negative.   BP 110/84   Pulse 90   Ht 5\' 10"  (1.778 m)   Wt 199 lb 12.8 oz (90.6 kg)   SpO2 98%   BMI 28.67 kg/m   Physical Examination: General: Well developed, well nourished, NAD   HEENT: OP clear, mucus membranes moist  SKIN: warm, dry. No rashes. Neuro: No focal deficits  Musculoskeletal: Muscle strength 5/5 all ext  Psychiatric: Mood and affect normal  Neck: No JVD, no carotid bruits, no thyromegaly, no lymphadenopathy.  Lungs:Clear bilaterally, no wheezes, rhonci, crackles Cardiovascular: Regular rate and rhythm. No murmurs, gallops or rubs. Abdomen:Soft. Bowel sounds present. Non-tender.  Extremities: No lower extremity edema. Pulses are 2 + in the bilateral DP/PT.  EKG:  EKG is ordered today. The ekg ordered today demonstrates Sinus, poor R wave progression precordial leads.   Recent Labs: 03/11/2020: ALT 14 05/12/2020: TSH 1.760 10/31/2020: BUN 28; Creatinine, Ser 2.80; Hemoglobin 11.8; Platelets 182; Potassium 4.1; Sodium 133   Lipid Panel    Component Value Date/Time   CHOL 200 (H) 05/12/2020 1534   TRIG 83 05/12/2020 1534   HDL 69 05/12/2020 1534   CHOLHDL 2.9 05/12/2020 1534   LDLCALC 116 (H) 05/12/2020 1534     Wt Readings from Last 3 Encounters:  02/20/21 199 lb 12.8 oz (90.6 kg)  11/29/20 207 lb 12.8 oz (94.3 kg)  11/09/20 203 lb 12.8 oz (92.4 kg)      Assessment and Plan:    1. Abnormal EKG: He has non-specific abnormality of his EKG with poor R wave progression. Will arrange an echo to assess LVEF and exclude structural heart disease.   HTN: BP controlled. No changes  Current medicines are reviewed at length with the patient today.  The patient does not have concerns regarding medicines.  The following changes have been made:  no change  Labs/ tests ordered today include:   Orders Placed This Encounter  Procedures   EKG 12-Lead   ECHOCARDIOGRAM COMPLETE      Disposition:   F/U with me as needed.    Signed, 11/11/20, MD 02/20/2021 9:02 AM    [facility] [address] [place], [place], [place]  [place] Phone: [phone]; Fax: [phone]

## 2021-02-20 NOTE — Patient Instructions (Signed)
Medication Instructions:  No changes *If you need a refill on your cardiac medications before your next appointment, please call your pharmacy*   Lab Work: none If you have labs (blood work) drawn today and your tests are completely normal, you will receive your results only by: MyChart Message (if you have MyChart) OR A paper copy in the mail If you have any lab test that is abnormal or we need to change your treatment, we will call you to review the results.   Testing/Procedures: Your physician has requested that you have an echocardiogram. Echocardiography is a painless test that uses sound waves to create images of your heart. It provides your doctor with information about the size and shape of your heart and how well your heart's chambers and valves are working. This procedure takes approximately one hour. There are no restrictions for this procedure.   Follow-Up: As needed   Other Instructions   

## 2021-03-01 ENCOUNTER — Encounter (INDEPENDENT_AMBULATORY_CARE_PROVIDER_SITE_OTHER): Payer: Self-pay | Admitting: Primary Care

## 2021-03-01 ENCOUNTER — Ambulatory Visit (INDEPENDENT_AMBULATORY_CARE_PROVIDER_SITE_OTHER): Payer: BC Managed Care – PPO | Admitting: Primary Care

## 2021-03-01 ENCOUNTER — Other Ambulatory Visit: Payer: Self-pay

## 2021-03-01 VITALS — BP 128/90 | HR 89 | Temp 97.3°F | Ht 70.0 in | Wt 202.6 lb

## 2021-03-01 DIAGNOSIS — Z23 Encounter for immunization: Secondary | ICD-10-CM | POA: Diagnosis not present

## 2021-03-01 DIAGNOSIS — E119 Type 2 diabetes mellitus without complications: Secondary | ICD-10-CM | POA: Diagnosis not present

## 2021-03-01 LAB — POCT GLYCOSYLATED HEMOGLOBIN (HGB A1C): Hemoglobin A1C: 7.3 % — AB (ref 4.0–5.6)

## 2021-03-01 MED ORDER — EMPAGLIFLOZIN 10 MG PO TABS: 10.0000 mg | ORAL_TABLET | Freq: Every day | ORAL | 1 refills | Status: DC

## 2021-03-01 NOTE — Patient Instructions (Signed)
Influenza, Adult °Influenza is also called "the flu." It is an infection in the lungs, nose, and throat (respiratory tract). It spreads easily from person to person (is contagious). The flu causes symptoms that are like a cold, along with high fever and body aches. °What are the causes? °This condition is caused by the influenza virus. You can get the virus by: °Breathing in droplets that are in the air after a person infected with the flu coughed or sneezed. °Touching something that has the virus on it and then touching your mouth, nose, or eyes. °What increases the risk? °Certain things may make you more likely to get the flu. These include: °Not washing your hands often. °Having close contact with many people during cold and flu season. °Touching your mouth, eyes, or nose without first washing your hands. °Not getting a flu shot every year. °You may have a higher risk for the flu, and serious problems, such as a lung infection (pneumonia), if you: °Are older than 65. °Are pregnant. °Have a weakened disease-fighting system (immune system) because of a disease or because you are taking certain medicines. °Have a long-term (chronic) condition, such as: °Heart, kidney, or lung disease. °Diabetes. °Asthma. °Have a liver disorder. °Are very overweight (morbidly obese). °Have anemia. °What are the signs or symptoms? °Symptoms usually begin suddenly and last 4-14 days. They may include: °Fever and chills. °Headaches, body aches, or muscle aches. °Sore throat. °Cough. °Runny or stuffy (congested) nose. °Feeling discomfort in your chest. °Not wanting to eat as much as normal. °Feeling weak or tired. °Feeling dizzy. °Feeling sick to your stomach or throwing up. °How is this treated? °If the flu is found early, you can be treated with antiviral medicine. This can help to reduce how bad the illness is and how long it lasts. This may be given by mouth or through an IV tube. °Taking care of yourself at home can help your  symptoms get better. Your doctor may want you to: °Take over-the-counter medicines. °Drink plenty of fluids. °The flu often goes away on its own. If you have very bad symptoms or other problems, you may be treated in a hospital. °Follow these instructions at home: °  °Activity °Rest as needed. Get plenty of sleep. °Stay home from work or school as told by your doctor. °Do not leave home until you do not have a fever for 24 hours without taking medicine. °Leave home only to go to your doctor. °Eating and drinking °Take an ORS (oral rehydration solution). This is a drink that is sold at pharmacies and stores. °Drink enough fluid to keep your pee pale yellow. °Drink clear fluids in small amounts as you are able. Clear fluids include: °Water. °Ice chips. °Fruit juice mixed with water. °Low-calorie sports drinks. °Eat bland foods that are easy to digest. Eat small amounts as you are able. These foods include: °Bananas. °Applesauce. °Rice. °Lean meats. °Toast. °Crackers. °Do not eat or drink: °Fluids that have a lot of sugar or caffeine. °Alcohol. °Spicy or fatty foods. °General instructions °Take over-the-counter and prescription medicines only as told by your doctor. °Use a cool mist humidifier to add moisture to the air in your home. This can make it easier for you to breathe. °When using a cool mist humidifier, clean it daily. Empty water and replace with clean water. °Cover your mouth and nose when you cough or sneeze. °Wash your hands with soap and water often and for at least 20 seconds. This is also important after   you cough or sneeze. If you cannot use soap and water, use alcohol-based hand sanitizer. °Keep all follow-up visits. °How is this prevented? ° °Get a flu shot every year. You may get the flu shot in late summer, fall, or winter. Ask your doctor when you should get your flu shot. °Avoid contact with people who are sick during fall and winter. This is cold and flu season. °Contact a doctor if: °You get  new symptoms. °You have: °Chest pain. °Watery poop (diarrhea). °A fever. °Your cough gets worse. °You start to have more mucus. °You feel sick to your stomach. °You throw up. °Get help right away if you: °Have shortness of breath. °Have trouble breathing. °Have skin or nails that turn a bluish color. °Have very bad pain or stiffness in your neck. °Get a sudden headache. °Get sudden pain in your face or ear. °Cannot eat or drink without throwing up. °These symptoms may represent a serious problem that is an emergency. Get medical help right away. Call your local emergency services (911 in the [place] °Do not wait to see if the symptoms will go away. °Do not drive yourself to the hospital. °Summary °Influenza is also called "the flu." It is an infection in the lungs, nose, and throat. It spreads easily from person to person. °Take over-the-counter and prescription medicines only as told by your doctor. °Getting a flu shot every year is the best way to not get the flu. °This information is not intended to replace advice given to you by your health care provider. Make sure you discuss any questions you have with your health care provider. °Document Revised: 11/27/2019 Document Reviewed: 11/27/2019 °Elsevier Patient Education © 2022 [employer] Inc. ° °

## 2021-03-01 NOTE — Progress Notes (Signed)
Subjective:  Patient ID: Tony Dean, male    DOB: February 18, 1961  Age: 60 y.o. MRN: [id number]  CC: Hypertension and Diabetes   HPI Mr.Tony Dean presents for follow-up of diabetes. Patient does not check blood sugar at home  Compliant with meds - Yes Checking CBGs? No  Fasting avg -   Postprandial average -  Exercising regularly? - Yes Watching carbohydrate intake? - Yes Neuropathy ? - No Hypoglycemic events - No  - Recovers with :   Pertinent ROS:  Polyuria - No Polydipsia - No Vision problems - No  Medications as noted below. Taking them regularly without complication/adverse reaction being reported today.   History Monti has a past medical history of Abnormal electrocardiogram (ECG) (EKG), Dehydration, Dizziness, DM (diabetes mellitus) (HCC), ED (erectile dysfunction), Gout, Hypertension, Lymphangitis of groin, Scrotal pain, Sprain of left wrist, and Urinary retention.   He has a past surgical history that includes Appendectomy and Inguinal hernia repair (1990's).   His family history includes Healthy in his mother; Hypertension in his father.He reports that he has never smoked. He has never used smokeless tobacco. He reports current alcohol use of about 22.0 - 24.0 standard drinks per week. He reports that he does not use drugs.  Current Outpatient Medications on File Prior to Visit  Medication Sig Dispense Refill   acetaminophen (TYLENOL) 500 MG tablet Take 1 tablet (500 mg total) by mouth every 6 (six) hours as needed. 30 tablet 0   allopurinol (ZYLOPRIM) 100 MG tablet Take 1 tablet (100 mg total) by mouth daily. 90 tablet 1   amLODipine (NORVASC) 10 MG tablet Take 1 tablet (10 mg total) by mouth daily. 90 tablet 1   atorvastatin (LIPITOR) 20 MG tablet Take 1 tablet (20 mg total) by mouth daily. 90 tablet 1   carvedilol (COREG) 6.25 MG tablet Take 1 tablet (6.25 mg total) by mouth 2 (two) times daily with a meal. 180 tablet 1   chlorthalidone (HYGROTON) 50 MG  tablet Take 1 tablet (50 mg total) by mouth daily. 90 tablet 1   clotrimazole (LOTRIMIN) 1 % cream Apply to affected area 2 times daily 15 g 0   cyclobenzaprine (FLEXERIL) 5 MG tablet Take 1 tablet (5 mg total) by mouth 2 (two) times daily as needed for muscle spasms. 30 tablet 0   losartan (COZAAR) 100 MG tablet Take 1 tablet (100 mg total) by mouth daily. 90 tablet 1   metFORMIN (GLUCOPHAGE XR) 500 MG 24 hr tablet Take 1 tablet (500 mg total) by mouth daily with breakfast. 90 tablet 1   senna (SENOKOT) 8.6 MG TABS tablet Take 1 tablet (8.6 mg total) by mouth daily. 120 tablet 0   tadalafil (CIALIS) 10 MG tablet Take 1 tablet (10 mg total) by mouth every other day as needed for erectile dysfunction. 10 tablet 1   No current facility-administered medications on file prior to visit.    ROS Review of Systems  Genitourinary:  Positive for enuresis.  All other systems reviewed and are negative.  Objective:  BP 128/90 (BP Location: Right Arm, Patient Position: Sitting, Cuff Size: Normal)   Pulse 89   Temp (!) 97.3 F (36.3 C) (Temporal)   Ht 5\' 10"  (1.778 m)   Wt 202 lb 9.6 oz (91.9 kg)   SpO2 96%   BMI 29.07 kg/m   BP Readings from Last 3 Encounters:  03/01/21 128/90  02/20/21 110/84  11/29/20 (!) 145/96    Wt Readings from Last 3 Encounters:  03/01/21  202 lb 9.6 oz (91.9 kg)  02/20/21 199 lb 12.8 oz (90.6 kg)  11/29/20 207 lb 12.8 oz (94.3 kg)   Physical exam: General: Vital signs reviewed.  Patient is well-developed and well-nourished, over weight male in no acute distress and cooperative with exam. Head: Normocephalic and atraumatic. Eyes: EOMI, conjunctivae normal, no scleral icterus. Neck: Supple, trachea midline, normal ROM, no JVD, masses, thyromegaly, or carotid bruit present. Cardiovascular: RRR, S1 normal, S2 normal, no murmurs, gallops, or rubs. Pulmonary/Chest: Clear to auscultation bilaterally, no wheezes, rales, or rhonchi. Abdominal: Soft, non-tender,  non-distended, BS +, no masses, organomegaly, or guarding present. Musculoskeletal: No joint deformities, erythema, or stiffness, ROM full and nontender. Extremities: No lower extremity edema bilaterally,  pulses symmetric and intact bilaterally. No cyanosis or clubbing. Neurological: A&O x3, Strength is normal Skin: Warm, dry and intact. No rashes or erythema. Psychiatric: Normal mood and affect. speech and behavior is normal. Cognition and memory are normal.    Lab Results  Component Value Date   HGBA1C 6.2 (A) 08/12/2020   HGBA1C 6.7 (A) 08/31/2019   HGBA1C 6.9 (A) 05/28/2019    Lab Results  Component Value Date   WBC 12.3 (H) 10/31/2020   HGB 11.8 (L) 10/31/2020   HCT 35.6 (L) 10/31/2020   PLT 182 10/31/2020   GLUCOSE 96 10/31/2020   CHOL 200 (H) 05/12/2020   TRIG 83 05/12/2020   HDL 69 05/12/2020   LDLCALC 116 (H) 05/12/2020   ALT 14 03/11/2020   AST 13 05/12/2020   NA 133 (L) 10/31/2020   K 4.1 10/31/2020   CL 98 10/31/2020   CREATININE 2.80 (H) 10/31/2020   BUN 28 (H) 10/31/2020   CO2 23 10/31/2020   TSH 1.760 05/12/2020   HGBA1C 6.2 (A) 08/12/2020     Assessment & Plan:   Havard was seen today for hypertension and diabetes.  Diagnoses and all orders for this visit:  Type 2 diabetes mellitus without complication, without long-term current use of insulin (HCC) -     HgB A1c 7.3 Increase in A1C  from 6.2 to 7.3 adjusted medication to avoid hypoglycemia. Re-evaluate medication . Monitor foods that are high in carbohydrates are the following rice, potatoes, breads, sugars, and pastas.  Reduction in the intake (eating) will assist in lowering your blood sugars. Added Jardiance 10mg .  Need for immunization against influenza -     Flu Vaccine QUAD 22mo+IM (Fluarix, Fluzone & Alfiuria Quad PF)   I have discontinued 5mo Hord's Iron (Ferrous Sulfate). I am also having him maintain his allopurinol, atorvastatin, senna, clotrimazole, acetaminophen, tadalafil,  carvedilol, amLODipine, chlorthalidone, losartan, metFORMIN, and cyclobenzaprine.  No orders of the defined types were placed in this encounter.    Follow-up:   No follow-ups on file.  The above assessment and management plan was discussed with the patient. The patient verbalized understanding of and has agreed to the management plan. Patient is aware to call the clinic if symptoms fail to improve or worsen. Patient is aware when to return to the clinic for a follow-up visit. Patient educated on when it is appropriate to go to the emergency department.   Iantha Fallen, NP-C

## 2021-03-13 ENCOUNTER — Ambulatory Visit (HOSPITAL_COMMUNITY): Payer: BC Managed Care – PPO | Attending: Cardiovascular Disease

## 2021-03-13 ENCOUNTER — Other Ambulatory Visit: Payer: Self-pay

## 2021-03-13 DIAGNOSIS — I10 Essential (primary) hypertension: Secondary | ICD-10-CM | POA: Diagnosis not present

## 2021-03-13 DIAGNOSIS — R9431 Abnormal electrocardiogram [ECG] [EKG]: Secondary | ICD-10-CM

## 2021-03-13 LAB — ECHOCARDIOGRAM COMPLETE
Area-P 1/2: 3.21 cm2
P 1/2 time: 351 msec
S' Lateral: 3 cm

## 2021-03-31 ENCOUNTER — Telehealth: Payer: Self-pay | Admitting: *Deleted

## 2021-03-31 DIAGNOSIS — R931 Abnormal findings on diagnostic imaging of heart and coronary circulation: Secondary | ICD-10-CM

## 2021-03-31 DIAGNOSIS — I77810 Thoracic aortic ectasia: Secondary | ICD-10-CM

## 2021-03-31 DIAGNOSIS — Z01812 Encounter for preprocedural laboratory examination: Secondary | ICD-10-CM

## 2021-03-31 NOTE — Telephone Encounter (Signed)
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 He will need a gated cardiac CTA to evaluate this. This does not have to be a coronary CTA. Thanks, chris

## 2021-03-31 NOTE — Telephone Encounter (Signed)
Spoke with patient and reviewed.  He voices understanding and is aware he will be contacted to schedule CT.

## 2021-04-05 NOTE — Telephone Encounter (Signed)
Ct scheduled for 12/29.  Called pt and arranged for lab work.  He will come on 04/11/21.  Appointment made.

## 2021-04-11 ENCOUNTER — Other Ambulatory Visit: Payer: Self-pay

## 2021-04-11 ENCOUNTER — Other Ambulatory Visit: Payer: BC Managed Care – PPO

## 2021-04-11 DIAGNOSIS — Z01812 Encounter for preprocedural laboratory examination: Secondary | ICD-10-CM

## 2021-04-12 LAB — BASIC METABOLIC PANEL
BUN/Creatinine Ratio: 12 (ref 10–24)
BUN: 32 mg/dL — ABNORMAL HIGH (ref 8–27)
CO2: 25 mmol/L (ref 20–29)
Calcium: 9.7 mg/dL (ref 8.6–10.2)
Chloride: 100 mmol/L (ref 96–106)
Creatinine, Ser: 2.63 mg/dL — ABNORMAL HIGH (ref 0.76–1.27)
Glucose: 160 mg/dL — ABNORMAL HIGH (ref 70–99)
Potassium: 4.1 mmol/L (ref 3.5–5.2)
Sodium: 142 mmol/L (ref 134–144)
eGFR: 27 mL/min/{1.73_m2} — ABNORMAL LOW (ref >59)

## 2021-04-18 ENCOUNTER — Telehealth: Payer: Self-pay

## 2021-04-18 DIAGNOSIS — R931 Abnormal findings on diagnostic imaging of heart and coronary circulation: Secondary | ICD-10-CM

## 2021-04-18 DIAGNOSIS — I77810 Thoracic aortic ectasia: Secondary | ICD-10-CM

## 2021-04-18 NOTE — Telephone Encounter (Signed)
Called pt and informed him that CT for 04/19/21 has been canceled d/t renal insufficieny.   Informed him that MRA will be performed at [facility].  A scheduler will call pt to set up appointment.  Pt verbalizes understanding all questions answered.

## 2021-04-18 NOTE — Telephone Encounter (Signed)
-----   Message from Kathleene Hazel, MD sent at 04/18/2021  9:34 AM EST ----- Good morning. Can someone attempt to contact Mr. Frankowski again today and tell him that it is against the protocol of the CT department to do the CTA given his baseline renal insufficiency? Can we change this to a chest MRA. No rush in getting this scheduled but his chest CTA for tomorrow will need to be cancelled. Thanks, chris

## 2021-04-19 ENCOUNTER — Inpatient Hospital Stay: Admission: RE | Admit: 2021-04-19 | Payer: BC Managed Care – PPO | Source: Ambulatory Visit

## 2021-05-06 ENCOUNTER — Ambulatory Visit (HOSPITAL_COMMUNITY): Payer: BC Managed Care – PPO

## 2021-05-13 ENCOUNTER — Ambulatory Visit (HOSPITAL_COMMUNITY)
Admission: RE | Admit: 2021-05-13 | Discharge: 2021-05-13 | Disposition: A | Discharge status: Home / Self Care | Payer: BC Managed Care – PPO | Source: Ambulatory Visit | Attending: Cardiovascular Disease | Admitting: Cardiovascular Disease

## 2021-05-13 DIAGNOSIS — I77810 Thoracic aortic ectasia: Secondary | ICD-10-CM | POA: Diagnosis present

## 2021-05-13 DIAGNOSIS — R931 Abnormal findings on diagnostic imaging of heart and coronary circulation: Secondary | ICD-10-CM | POA: Diagnosis present

## 2021-05-13 MED ORDER — GADOBUTROL 1 MMOL/ML IV SOLN
9.0000 mL | Freq: Once | INTRAVENOUS | Status: AC | PRN
  Administered 2021-05-13: 9 mL via INTRAVENOUS

## 2021-05-17 ENCOUNTER — Other Ambulatory Visit: Payer: Self-pay | Admitting: *Deleted

## 2021-05-17 DIAGNOSIS — I77810 Thoracic aortic ectasia: Secondary | ICD-10-CM

## 2021-05-17 NOTE — Progress Notes (Signed)
Tony Hazel, MD  Lendon Ka, RN Mild dilation of the ascending aorta. He will need repeat MRA chest in one year. chris

## 2021-05-24 ENCOUNTER — Ambulatory Visit (INDEPENDENT_AMBULATORY_CARE_PROVIDER_SITE_OTHER): Payer: Self-pay | Admitting: *Deleted

## 2021-05-24 NOTE — Telephone Encounter (Signed)
I returned the call.  He called in saying he went to the urgent care yesterday for back pain.  He has an upcoming appt on 06/01/2021 with Gwinda Passe, NP but had a question about his BP.  I got a message that my call cannot be processed at this time, please call back later.

## 2021-05-24 NOTE — Telephone Encounter (Signed)
Patient called, no answer, recording call could not be completed at this time and try the call again later.   Summary: advice - BP   Pt was just seen in Urgent care yesterday for back pain, called in today regarding his blood pressure, pt has upcoming appt 2/9 but pt needed some advice before then since he thinks his BP was low, did not have the exact BP reading at time of call, please advise.

## 2021-05-24 NOTE — Telephone Encounter (Signed)
3rd attempt, Patient called, no answer, recording call could not be completed at this time will route to practice per protocol

## 2021-05-26 ENCOUNTER — Encounter (INDEPENDENT_AMBULATORY_CARE_PROVIDER_SITE_OTHER): Payer: Self-pay | Admitting: Primary Care

## 2021-05-26 ENCOUNTER — Ambulatory Visit (INDEPENDENT_AMBULATORY_CARE_PROVIDER_SITE_OTHER): Payer: Self-pay | Admitting: *Deleted

## 2021-05-26 NOTE — Telephone Encounter (Signed)
Note has been routed to PCP 

## 2021-05-26 NOTE — Telephone Encounter (Signed)
°  Chief Complaint: low BP with dizziness.   Should he hold off on BP medication until seen next week by Marcelino Duster? Symptoms: dizzy and lightheaded Frequency: past several days Pertinent Negatives: Patient denies N/A Disposition: [] ED /[] Urgent Care (no appt availability in office) / [] Appointment(In office/virtual)/ []  [facility]/ [] Home Care/ [] Refused Recommended Disposition /[] [facility]/ [x]  Follow-up with PCP Additional Notes: See triage notes.   Call pt and advise what he needs to do

## 2021-05-26 NOTE — Telephone Encounter (Signed)
Pt's son Sherrine Maples calling in with pt in the back ground answering questions too.   108/86 on Wed.   At dr. Last week they told me my BP was low.   She said to call my dr and let her know.   Dr. On Monday at urgent care told me to call my PCP.   BP at urgent care on Monday.    Should he continue his BP medication or    Went to urgent care for a back injury and was having muscle spasms.     Reason for Disposition  [1] Systolic BP 90-110 AND [2] taking blood pressure medications AND [3] dizzy, lightheaded or weak  Answer Assessment - Initial Assessment Questions 1. BLOOD PRESSURE: "What is the blood pressure?" "Did you take at least two measurements 5 minutes apart?"     Monday at the urgent care it was 108/82.  2. ONSET: "When did you take your blood pressure?"     Monday at the urgent care.    He hurt his back on Thur. And was having back pain and muscle spasms.   He went to the urgent care.   His BP was 108/82 which the dr. At [facility] said was low.   He is c/o having dizziness and lightheadedness.   [facility] dr. Davina Poke him to call his PCP.   He wants to know should he continue his BP medication or wait until he is seen by Gwinda Passe, NP next week. 3. HOW: "How did you obtain the blood pressure?" (e.g., visiting nurse, automatic home BP monitor)     At urgent care 4. HISTORY: "Do you have a history of low blood pressure?" "What is your blood pressure normally?"     No    high BP 5. MEDICATIONS: "Are you taking any medications for blood pressure?" If Yes, ask: "Have they been changed recently?"     Several BP medications.   Should he continue taking them or hold? 6. PULSE RATE: "Do you know what your pulse rate is?"      No 7. OTHER SYMPTOMS: "Have you been sick recently?" "Have you had a recent injury?"     Lightheaded and dizzy. 8. PREGNANCY: "Is there any chance you are pregnant?" "When was your last menstrual period?"     N/A  Protocols used: Blood Pressure - Low-A-AH

## 2021-05-26 NOTE — Telephone Encounter (Signed)
Please advise 

## 2021-06-01 ENCOUNTER — Other Ambulatory Visit: Payer: Self-pay

## 2021-06-01 ENCOUNTER — Ambulatory Visit (INDEPENDENT_AMBULATORY_CARE_PROVIDER_SITE_OTHER): Payer: BC Managed Care – PPO | Admitting: Primary Care

## 2021-06-01 ENCOUNTER — Encounter (INDEPENDENT_AMBULATORY_CARE_PROVIDER_SITE_OTHER): Payer: Self-pay | Admitting: Primary Care

## 2021-06-01 VITALS — BP 104/70 | HR 91 | Temp 97.6°F | Ht 70.0 in

## 2021-06-01 DIAGNOSIS — M205X1 Other deformities of toe(s) (acquired), right foot: Secondary | ICD-10-CM

## 2021-06-01 DIAGNOSIS — N521 Erectile dysfunction due to diseases classified elsewhere: Secondary | ICD-10-CM

## 2021-06-01 DIAGNOSIS — E119 Type 2 diabetes mellitus without complications: Secondary | ICD-10-CM

## 2021-06-01 DIAGNOSIS — I10 Essential (primary) hypertension: Secondary | ICD-10-CM

## 2021-06-01 DIAGNOSIS — Z760 Encounter for issue of repeat prescription: Secondary | ICD-10-CM

## 2021-06-01 DIAGNOSIS — Z23 Encounter for immunization: Secondary | ICD-10-CM

## 2021-06-01 LAB — POCT GLYCOSYLATED HEMOGLOBIN (HGB A1C): Hemoglobin A1C: 6.7 % — AB (ref 4.0–5.6)

## 2021-06-01 MED ORDER — TADALAFIL 10 MG PO TABS: 10.0000 mg | ORAL_TABLET | ORAL | 1 refills | Status: DC | PRN

## 2021-06-01 MED ORDER — LOSARTAN POTASSIUM 25 MG PO TABS: 25.0000 mg | ORAL_TABLET | Freq: Every day | ORAL | 1 refills | Status: DC

## 2021-06-01 MED ORDER — EMPAGLIFLOZIN 10 MG PO TABS: 10.0000 mg | ORAL_TABLET | Freq: Every day | ORAL | 1 refills | Status: DC

## 2021-06-01 MED ORDER — CARVEDILOL 6.25 MG PO TABS: 6.2500 mg | ORAL_TABLET | Freq: Two times a day (BID) | ORAL | 1 refills | Status: DC

## 2021-06-01 NOTE — Progress Notes (Signed)
Tony Dean, is a 61 y.o. male  BTY:[id number]  HFS:[id number]  DOB - Jul 01, 1960  Chief Complaint  Patient presents with   Diabetes       Subjective:   Tony Dean is a 61 y.o. male here today for a follow up management of T2D and HTN. Current symptoms/problems include polyuria and have been improving. Symptoms have been present for 1 year.  Current diabetic medications include oral agent (monotherapy): combination: Jardiance 10mg    The patient was initially diagnosed with Type 2 diabetes mellitus based on the following criteria:  A1C.6.7 improved from 7.3  Current monitoring regimen: none Any episodes of hypoglycemia? No Patient has No headache, No chest pain, No abdominal pain - No Nausea, No new weakness tingling or numbness, No Cough - shortness of breath  .Patient hurt his back about 2 weeks ago helping move their car out 8/10 requesting cyclobenzaprine- but states it doesn't help? Complains of lightheadedness and dizziness checked Bp at home and it was low. Today it is low decreased losartan from 100mg  to 25mg  daily  No problems updated.  ALLERGIES: Allergies  Allergen Reactions   Lisinopril Cough    PAST MEDICAL HISTORY: Past Medical History:  Diagnosis Date   Abnormal electrocardiogram (ECG) (EKG)    Dehydration    Dizziness    DM (diabetes mellitus) (HCC)    ED (erectile dysfunction)    Gout    Hypertension    Lymphangitis of groin    Scrotal pain    Sprain of left wrist    Urinary retention     MEDICATIONS AT HOME: Prior to Admission medications   Medication Sig Start Date End Date Taking? Authorizing Provider  allopurinol (ZYLOPRIM) 100 MG tablet Take 1 tablet (100 mg total) by mouth daily. 05/12/20  Yes Mayers, Cari S, PA-C  amLODipine (NORVASC) 10 MG tablet Take 1 tablet (10 mg total) by mouth daily. 11/09/20  Yes Kerin Perna, NP  atorvastatin (LIPITOR) 20 MG tablet Take 1 tablet (20 mg total) by mouth  daily. 05/12/20  Yes Mayers, Cari S, PA-C  carvedilol (COREG) 6.25 MG tablet Take 1 tablet (6.25 mg total) by mouth 2 (two) times daily with a meal. 11/09/20  Yes Kerin Perna, NP  chlorthalidone (HYGROTON) 50 MG tablet Take 1 tablet (50 mg total) by mouth daily. 11/09/20  Yes Kerin Perna, NP  clotrimazole (LOTRIMIN) 1 % cream Apply to affected area 2 times daily 08/15/20  Yes Burky, Lanelle Bal B, NP  cyclobenzaprine (FLEXERIL) 10 MG tablet Take 10 mg by mouth daily as needed. 05/23/21  Yes [provider]  empagliflozin (JARDIANCE) 10 MG TABS tablet Take 1 tablet (10 mg total) by mouth daily before breakfast. 03/01/21  Yes Kerin Perna, NP  losartan (COZAAR) 100 MG tablet Take 1 tablet (100 mg total) by mouth daily. 11/09/20  Yes Kerin Perna, NP  metFORMIN (GLUCOPHAGE XR) 500 MG 24 hr tablet Take 1 tablet (500 mg total) by mouth daily with breakfast. 11/09/20  Yes Kerin Perna, NP  naproxen (NAPROSYN) 500 MG tablet Take 500 mg by mouth 2 (two) times daily. 05/23/21  Yes [provider]  tadalafil (CIALIS) 10 MG tablet Take 1 tablet (10 mg total) by mouth every other day as needed for erectile dysfunction. 11/09/20   Kerin Perna, NP    Objective:   Vitals:   06/01/21 0837  BP: 104/70  Pulse: 91  Temp: 97.6 F (36.4 C)  TempSrc: Oral  Height: '5\' 10"'$  (1.778 m)   Exam General appearance : Awake, alert, not in any distress. Speech Clear. Not toxic looking HEENT: Atraumatic and Normocephalic, pupils equally reactive to light and accomodation Neck: Supple, no JVD. No cervical lymphadenopathy.  Chest: Good air entry bilaterally, no added sounds  CVS: S1 S2 regular, no murmurs.  Abdomen: Bowel sounds present, Non tender and not distended with no gaurding, rigidity or rebound. Extremities: B/L Lower Ext shows no edema, both legs are warm to touch Neurology: Awake alert, and oriented X 3, CN II-XII intact, Non focal Skin: No Rash  Data  Review Lab Results  Component Value Date   HGBA1C 6.7 (A) 06/01/2021   HGBA1C 7.3 (A) 03/01/2021   HGBA1C 6.2 (A) 08/12/2020    Assessment & Plan   1. Type 2 diabetes mellitus without complication, without long-term current use of insulin (HCC) - HgB A1c 6.7 improvement from 11/22 7.3  - Lipid Panel - CBC with Differential  2. Need for shingles vaccine  - Varicella-zoster vaccine IM (Shingrix)  3. Primary hypertension Continue to follow, low-sodium, DASH diet, medication compliance, 150 minutes of moderate intensity exercise per week. Discussed medication compliance, adverse effects.  Bp  low medication decreased losartan from $RemoveBefor'100mg'bYlFkEwxNNXr$  to $R'25mg'nX$  daily - CMP14+EGFR Medication refill -     losartan (COZAAR) 25 MG tablet; Take 1 tablet (25 mg total) by mouth daily. -     carvedilol (COREG) 6.25 MG tablet; Take 1 tablet (6.25 mg total) by mouth 2 (two) times daily with a meal.  Comprehensive diabetic foot examination, type 2 DM, encounter for [facility]) completed  Erectile disorder due to medical condition in male -     tadalafil (CIALIS) 10 MG tablet; Take 1 tablet (10 mg total) by mouth every other day as needed for erectile dysfunction.  Claw toe, acquired, right -     Ambulatory referral to Podiatry     Patient have been counseled extensively about nutrition and exercise. Other issues discussed during this visit include: low cholesterol diet, weight control and daily exercise, foot care, annual eye examinations at Ophthalmology, importance of adherence with medications and regular follow-up. We also discussed long term complications of uncontrolled diabetes and hypertension.   Return in about 4 weeks (around 06/29/2021) for BP f/u .  The patient was given clear instructions to go to ER or return to medical center if symptoms don't improve, worsen or new problems develop. The patient verbalized understanding. The patient was told to call to get lab results if they haven't heard anything  in the next week.   This note has been created with Surveyor, quantity. Any transcriptional errors are unintentional.   Kerin Perna, NP 06/04/2021, 1:43 PM

## 2021-06-01 NOTE — Patient Instructions (Signed)
Zoster Vaccine, Recombinant injection °What is this medication? °ZOSTER VACCINE (ZOS ter vak SEEN) is a vaccine used to reduce the risk of getting shingles. This vaccine is not used to treat shingles or nerve pain from shingles. °This medicine may be used for other purposes; ask your health care provider or pharmacist if you have questions. °COMMON BRAND NAME(S): SHINGRIX °What should I tell my care team before I take this medication? °They need to know if you have any of these conditions: °cancer °immune system problems °an unusual or allergic reaction to Zoster vaccine, other medications, foods, dyes, or preservatives °pregnant or trying to get pregnant °breast-feeding °How should I use this medication? °This vaccine is injected into a muscle. It is given by a health care provider. °A copy of Vaccine Information Statements will be given before each vaccination. Be sure to read this information carefully each time. This sheet may change often. °Talk to your health care provider about the use of this vaccine in children. This vaccine is not approved for use in children. °Overdosage: If you think you have taken too much of this medicine contact a poison control center or emergency room at once. °NOTE: This medicine is only for you. Do not share this medicine with others. °What if I miss a dose? °Keep appointments for follow-up (booster) doses. It is important not to miss your dose. Call your health care provider if you are unable to keep an appointment. °What may interact with this medication? °medicines that suppress your immune system °medicines to treat cancer °steroid medicines like prednisone or cortisone °This list may not describe all possible interactions. Give your health care provider a list of all the medicines, herbs, non-prescription drugs, or dietary supplements you use. Also tell them if you smoke, drink alcohol, or use illegal drugs. Some items may interact with your medicine. °What should I watch for  while using this medication? °Visit your health care provider regularly. °This vaccine, like all vaccines, may not fully protect everyone. °What side effects may I notice from receiving this medication? °Side effects that you should report to your doctor or health care professional as soon as possible: °allergic reactions (skin rash, itching or hives; swelling of the face, lips, or tongue) °trouble breathing °Side effects that usually do not require medical attention (report these to your doctor or health care professional if they continue or are bothersome): °chills °headache °fever °nausea °pain, redness, or irritation at site where injected °tiredness °vomiting °This list may not describe all possible side effects. Call your doctor for medical advice about side effects. You may report side effects to FDA at 1-800-FDA-1088. °Where should I keep my medication? °This vaccine is only given by a health care provider. It will not be stored at home. °NOTE: This sheet is a summary. It may not cover all possible information. If you have questions about this medicine, talk to your doctor, pharmacist, or health care provider. °© 2022 Elsevier/Gold Standard (2020-12-27 00:00:00) ° °

## 2021-06-01 NOTE — Progress Notes (Signed)
Patient hurt his back about 2 weeks ago helping move their car out Still having back pain  Was given cyclobenzaprine- needs refill Complains of lightheadedness and dizziness checked Bp at home and it was low  Still takes the medication but did not take today Pt is fasting

## 2021-06-02 LAB — CMP14+EGFR
ALT: 8 IU/L (ref 0–44)
AST: 7 IU/L (ref 0–40)
Albumin/Globulin Ratio: 1 — ABNORMAL LOW (ref 1.2–2.2)
Albumin: 3.8 g/dL (ref 3.8–4.9)
Alkaline Phosphatase: 74 IU/L (ref 44–121)
BUN/Creatinine Ratio: 11 (ref 10–24)
BUN: 73 mg/dL — ABNORMAL HIGH (ref 8–27)
Bilirubin Total: 0.3 mg/dL (ref 0.0–1.2)
CO2: 17 mmol/L — ABNORMAL LOW (ref 20–29)
Calcium: 9.5 mg/dL (ref 8.6–10.2)
Chloride: 94 mmol/L — ABNORMAL LOW (ref 96–106)
Creatinine, Ser: 6.62 mg/dL — ABNORMAL HIGH (ref 0.76–1.27)
Globulin, Total: 3.7 g/dL (ref 1.5–4.5)
Glucose: 106 mg/dL — ABNORMAL HIGH (ref 70–99)
Potassium: 4.4 mmol/L (ref 3.5–5.2)
Sodium: 133 mmol/L — ABNORMAL LOW (ref 134–144)
Total Protein: 7.5 g/dL (ref 6.0–8.5)
eGFR: 9 mL/min/{1.73_m2} — ABNORMAL LOW (ref >59)

## 2021-06-02 LAB — CBC WITH DIFFERENTIAL/PLATELET
Basophils Absolute: 0 10*3/uL (ref 0.0–0.2)
Basos: 0 %
EOS (ABSOLUTE): 0.2 10*3/uL (ref 0.0–0.4)
Eos: 2 %
Hematocrit: 29.3 % — ABNORMAL LOW (ref 37.5–51.0)
Hemoglobin: 9.9 g/dL — ABNORMAL LOW (ref 13.0–17.7)
Immature Grans (Abs): 0.1 10*3/uL (ref 0.0–0.1)
Immature Granulocytes: 1 %
Lymphocytes Absolute: 1.9 10*3/uL (ref 0.7–3.1)
Lymphs: 22 %
MCH: 30.5 pg (ref 26.6–33.0)
MCHC: 33.8 g/dL (ref 31.5–35.7)
MCV: 90 fL (ref 79–97)
Monocytes Absolute: 1.2 10*3/uL — ABNORMAL HIGH (ref 0.1–0.9)
Monocytes: 14 %
Neutrophils Absolute: 5.4 10*3/uL (ref 1.4–7.0)
Neutrophils: 61 %
Platelets: 562 10*3/uL — ABNORMAL HIGH (ref 150–450)
RBC: 3.25 x10E6/uL — ABNORMAL LOW (ref 4.14–5.80)
RDW: 13.3 % (ref 11.6–15.4)
WBC: 8.7 10*3/uL (ref 3.4–10.8)

## 2021-06-02 LAB — LIPID PANEL
Chol/HDL Ratio: 3.3 ratio (ref 0.0–5.0)
Cholesterol, Total: 121 mg/dL (ref 100–199)
HDL: 37 mg/dL — ABNORMAL LOW (ref >39)
LDL Chol Calc (NIH): 66 mg/dL (ref 0–99)
Triglycerides: 93 mg/dL (ref 0–149)
VLDL Cholesterol Cal: 18 mg/dL (ref 5–40)

## 2021-06-08 ENCOUNTER — Other Ambulatory Visit (INDEPENDENT_AMBULATORY_CARE_PROVIDER_SITE_OTHER): Payer: Self-pay | Admitting: Primary Care

## 2021-06-08 ENCOUNTER — Ambulatory Visit (INDEPENDENT_AMBULATORY_CARE_PROVIDER_SITE_OTHER): Payer: BC Managed Care – PPO

## 2021-06-08 ENCOUNTER — Telehealth: Payer: Self-pay

## 2021-06-08 ENCOUNTER — Other Ambulatory Visit: Payer: Self-pay

## 2021-06-08 ENCOUNTER — Ambulatory Visit: Payer: BC Managed Care – PPO | Admitting: Podiatry

## 2021-06-08 DIAGNOSIS — M19079 Primary osteoarthritis, unspecified ankle and foot: Secondary | ICD-10-CM | POA: Diagnosis not present

## 2021-06-08 DIAGNOSIS — G629 Polyneuropathy, unspecified: Secondary | ICD-10-CM

## 2021-06-08 DIAGNOSIS — M7750 Other enthesopathy of unspecified foot: Secondary | ICD-10-CM | POA: Diagnosis not present

## 2021-06-08 DIAGNOSIS — E1149 Type 2 diabetes mellitus with other diabetic neurological complication: Secondary | ICD-10-CM

## 2021-06-08 DIAGNOSIS — M25572 Pain in left ankle and joints of left foot: Secondary | ICD-10-CM

## 2021-06-08 DIAGNOSIS — M25571 Pain in right ankle and joints of right foot: Secondary | ICD-10-CM | POA: Diagnosis not present

## 2021-06-08 DIAGNOSIS — M2141 Flat foot [pes planus] (acquired), right foot: Secondary | ICD-10-CM

## 2021-06-08 DIAGNOSIS — E119 Type 2 diabetes mellitus without complications: Secondary | ICD-10-CM | POA: Diagnosis not present

## 2021-06-08 DIAGNOSIS — M2142 Flat foot [pes planus] (acquired), left foot: Secondary | ICD-10-CM

## 2021-06-08 DIAGNOSIS — M7751 Other enthesopathy of right foot: Secondary | ICD-10-CM

## 2021-06-08 MED ORDER — MELOXICAM 7.5 MG PO TABS: 7.5000 mg | ORAL_TABLET | Freq: Every day | ORAL | 0 refills | Status: DC | PRN

## 2021-06-08 NOTE — Progress Notes (Signed)
SITUATION Reason for Consult: Evaluation for Prefabricated Diabetic Shoes and Bilateral Custom Diabetic Inserts. Patient / Caregiver Report: Patient would like well fitting shoes  OBJECTIVE DATA: Patient History / Diagnosis:    ICD-10-CM   1. Type 2 diabetes mellitus without complication, without long-term current use of insulin (HCC)  E11.9     2. Neuropathy  G62.9     3. Bilateral ankle pain, unspecified chronicity  M25.571    M25.572       Current or Previous Devices:   None and no history   In-Person Foot Examination: Ulcers & Callousing:   None  Toe / Foot Deformities:   - Pes Planus    Shoe Size: 11W  ORTHOTIC RECOMMENDATION Recommended Devices: - 1x pair prefabricated PDAC approved diabetic shoes: Patient selects Apex [id number] 11W - 3x pair custom-to-patient vacuum formed diabetic insoles.   GOALS OF SHOES AND INSOLES - Reduce shear and pressure - Reduce / Prevent callus formation - Reduce / Prevent ulceration - Protect the fragile healing compromised diabetic foot.  Patient would benefit from diabetic shoes and inserts as patient has diabetes mellitus and the patient has one or more of the following conditions: - Peripheral neuropathy with evidence of callus formation - Foot deformity - Poor circulation  ACTIONS PERFORMED Patient was casted for insoles via crush box and measured for shoes via brannock device. Procedure was explained and patient tolerated procedure well. All questions were answered and concerns addressed.  PLAN Patient is to ensure treating physician receives and completes diabetic paperwork. Casts and shoe order are to be held until paperwork is received. Once received patient is to be scheduled for fitting in four weeks.

## 2021-06-08 NOTE — Telephone Encounter (Signed)
Shoes Ordered - Apex [id number] Size [id number]

## 2021-06-08 NOTE — Telephone Encounter (Signed)
Casts sent to central [employer]. Shoes ordered.

## 2021-06-11 NOTE — Progress Notes (Signed)
Subjective:   Patient ID: Tony Dean, male   DOB: 61 y.o.   MRN: [id number]   HPI 61 year old male presents the office today for concerns of bilateral ankle discomfort started many years ago.  He states this started after working 8-hour shift.  Right side worse than left knee point on the medial aspect.  No injury recently.  Has a history of sprains when he was younger.  No recent treatment.  He has noticed some mild swelling.  He has tried icing.  He has no other concerns.  He does not smoke however he states he drinks about 4 cans of beer per day and also liquor.  He does have neuropathy.   Review of Systems  All other systems reviewed and are negative.  Past Medical History:  Diagnosis Date   Abnormal electrocardiogram (ECG) (EKG)    Dehydration    Dizziness    DM (diabetes mellitus) (HCC)    ED (erectile dysfunction)    Gout    Hypertension    Lymphangitis of groin    Scrotal pain    Sprain of left wrist    Urinary retention     Past Surgical History:  Procedure Laterality Date   APPENDECTOMY     INGUINAL HERNIA REPAIR  1990's   right     Current Outpatient Medications:    meloxicam (MOBIC) 7.5 MG tablet, Take 1 tablet (7.5 mg total) by mouth daily as needed for pain., Disp: 30 tablet, Rfl: 0   allopurinol (ZYLOPRIM) 100 MG tablet, Take 1 tablet (100 mg total) by mouth daily., Disp: 90 tablet, Rfl: 1   amLODipine (NORVASC) 10 MG tablet, Take 1 tablet (10 mg total) by mouth daily., Disp: 90 tablet, Rfl: 1   atorvastatin (LIPITOR) 20 MG tablet, Take 1 tablet (20 mg total) by mouth daily., Disp: 90 tablet, Rfl: 1   carvedilol (COREG) 6.25 MG tablet, Take 1 tablet (6.25 mg total) by mouth 2 (two) times daily with a meal., Disp: 180 tablet, Rfl: 1   clotrimazole (LOTRIMIN) 1 % cream, Apply to affected area 2 times daily, Disp: 15 g, Rfl: 0   empagliflozin (JARDIANCE) 10 MG TABS tablet, Take 1 tablet (10 mg total) by mouth daily before breakfast., Disp: 90 tablet, Rfl:  1   losartan (COZAAR) 25 MG tablet, Take 1 tablet (25 mg total) by mouth daily., Disp: 90 tablet, Rfl: 1   metFORMIN (GLUCOPHAGE XR) 500 MG 24 hr tablet, Take 1 tablet (500 mg total) by mouth daily with breakfast., Disp: 90 tablet, Rfl: 1   naproxen (NAPROSYN) 500 MG tablet, Take 500 mg by mouth 2 (two) times daily., Disp: , Rfl:    tadalafil (CIALIS) 10 MG tablet, Take 1 tablet (10 mg total) by mouth every other day as needed for erectile dysfunction., Disp: 10 tablet, Rfl: 1  Allergies  Allergen Reactions   Lisinopril Cough          Objective:  Physical Exam  General: AAO x3, NAD  Dermatological: Skin is warm, dry and supple bilateral. There are no open sores, no preulcerative lesions, no rash or signs of infection present.  Vascular: Dorsalis Pedis artery and Posterior Tibial artery pedal pulses are 2/4 bilateral with immedate capillary fill time.  There is no pain with calf compression, swelling, warmth, erythema.   Neruologic: Sensation decreased with Semmes Weinstein monofilament  Musculoskeletal: Tenderness on the medial aspect of the ankle joint on the right side worse than left.  Discomfort along the anteromedial ankle joint.  Mild discomfort in the course of the flexor tendons as well.  There is no area of pinpoint tenderness noted.  Decreased subtalar joint range of motion.  Trace edema present there is no erythema or warmth.  Muscular strength 5/5 in all groups tested bilateral.  Gait: Unassisted, Nonantalgic.       Assessment:   Arthritis, capsulitis ankle; Tendinitis     Plan:  -Treatment options discussed including all alternatives, risks, and complications -Etiology of symptoms were discussed -X-rays were obtained and reviewed with the patient.  Arthritic changes present the midfoot.  On the right side there is arthritic changes present in the medial ankle gutter. -Ultimately discussed with him trying shoes with better support.  He finds he is diabetic and has  neuropathy.  Try to order diabetic shoes.  Meloxicam 7.5 daily as needed.  Hold off any other anti-inflammatories.  Vivi Barrack DPM

## 2021-06-30 ENCOUNTER — Encounter (INDEPENDENT_AMBULATORY_CARE_PROVIDER_SITE_OTHER): Payer: Self-pay | Admitting: Primary Care

## 2021-06-30 ENCOUNTER — Other Ambulatory Visit: Payer: Self-pay

## 2021-06-30 ENCOUNTER — Ambulatory Visit (INDEPENDENT_AMBULATORY_CARE_PROVIDER_SITE_OTHER): Payer: BC Managed Care – PPO | Admitting: Primary Care

## 2021-06-30 DIAGNOSIS — Z760 Encounter for issue of repeat prescription: Secondary | ICD-10-CM

## 2021-06-30 DIAGNOSIS — I10 Essential (primary) hypertension: Secondary | ICD-10-CM

## 2021-06-30 MED ORDER — AMLODIPINE BESYLATE 10 MG PO TABS: 10.0000 mg | ORAL_TABLET | Freq: Every day | ORAL | 1 refills | Status: DC

## 2021-06-30 NOTE — Progress Notes (Signed)
?[facility] ? ? ?[name] is a 61 y.o. male presents for hypertension evaluation, Denies shortness of breath, headaches, chest pain or lower extremity edema, sudden onset, vision changes, unilateral weakness, dizziness, paresthesias  ? ?Patient reports adherence with medications. Added Losartan 25 mg at last visit , he has not picked it up . He was unsure what medicine he was missing because he just picked up his refills.  ? ?Dietary habits include: eating out discussed the sodium in take from fast foods ?Exercise habits include:yes ?Family / Social history: No ? ? ?Past Medical History:  ?Diagnosis Date  ? Abnormal electrocardiogram (ECG) (EKG)   ? Dehydration   ? Dizziness   ? DM (diabetes mellitus) (HCC)   ? ED (erectile dysfunction)   ? Gout   ? Hypertension   ? Lymphangitis of groin   ? Scrotal pain   ? Sprain of left wrist   ? Urinary retention   ? ?Past Surgical History:  ?Procedure Laterality Date  ? APPENDECTOMY    ? INGUINAL HERNIA REPAIR  1990's  ? right  ? ?Allergies  ?Allergen Reactions  ? Lisinopril Cough  ? ?Current Outpatient Medications on File Prior to Visit  ?Medication Sig Dispense Refill  ? allopurinol (ZYLOPRIM) 100 MG tablet Take 1 tablet (100 mg total) by mouth daily. 90 tablet 1  ? atorvastatin (LIPITOR) 20 MG tablet Take 1 tablet (20 mg total) by mouth daily. 90 tablet 1  ? carvedilol (COREG) 6.25 MG tablet Take 1 tablet (6.25 mg total) by mouth 2 (two) times daily with a meal. 180 tablet 1  ? clotrimazole (LOTRIMIN) 1 % cream Apply to affected area 2 times daily 15 g 0  ? empagliflozin (JARDIANCE) 10 MG TABS tablet Take 1 tablet (10 mg total) by mouth daily before breakfast. 90 tablet 1  ? losartan (COZAAR) 25 MG tablet Take 1 tablet (25 mg total) by mouth daily. 90 tablet 1  ? meloxicam (MOBIC) 7.5 MG tablet Take 1 tablet (7.5 mg total) by mouth daily as needed for pain. 30 tablet 0  ? metFORMIN (GLUCOPHAGE XR) 500 MG 24 hr tablet Take 1 tablet (500 mg total)  by mouth daily with breakfast. 90 tablet 1  ? naproxen (NAPROSYN) 500 MG tablet Take 500 mg by mouth 2 (two) times daily.    ? tadalafil (CIALIS) 10 MG tablet Take 1 tablet (10 mg total) by mouth every other day as needed for erectile dysfunction. 10 tablet 1  ? ?No current facility-administered medications on file prior to visit.  ? ?Social History  ? ?Socioeconomic History  ? Marital status: Legally Separated  ?  Spouse name: Not on file  ? Number of children: Not on file  ? Years of education: Not on file  ? Highest education level: Not on file  ?Occupational History  ? Not on file  ?Tobacco Use  ? Smoking status: Never  ? Smokeless tobacco: Never  ?Substance and Sexual Activity  ? Alcohol use: Yes  ?  Alcohol/week: 22.0 - 24.0 standard drinks  ?  Types: 14 Cans of beer, 8 - 10 Shots of liquor per week  ?  Comment: reports drinks daily  ? Drug use: No  ? Sexual activity: Not Currently  ?Other Topics Concern  ? Not on file  ?Social History Narrative  ? Not on file  ? ?Social Determinants of Health  ? ?Financial Resource Strain: Not on file  ?Food Insecurity: Not on file  ?Transportation Needs: Not on file  ?  Physical Activity: Not on file  ?Stress: Not on file  ?Social Connections: Not on file  ?Intimate Partner Violence: Not on file  ? ?Family History  ?Problem Relation Age of Onset  ? Healthy Mother   ? Hypertension Father   ? Colon cancer Neg Hx   ? ? ? ?OBJECTIVE: ? ?Vitals:  ? 06/30/21 0913 06/30/21 0924  ?BP: (!) 150/103 (!) 135/93  ?Pulse: 69 77  ?Temp: 98.1 ?F (36.7 ?C)   ?TempSrc: Oral   ?SpO2: 100%   ?Weight: 206 lb 6.4 oz (93.6 kg)   ?Height: 5\' 10"  (1.778 m)   ? ? ?Physical Exam ?Vitals reviewed.  ?Constitutional:   ?   Appearance: Normal appearance.  ?HENT:  ?   Right Ear: Tympanic membrane and external ear normal.  ?   Left Ear: Tympanic membrane and external ear normal.  ?Eyes:  ?   Extraocular Movements: Extraocular movements intact.  ?   Pupils: Pupils are equal, round, and reactive to light.   ?Cardiovascular:  ?   Rate and Rhythm: Normal rate and regular rhythm.  ?Pulmonary:  ?   Effort: Pulmonary effort is normal.  ?   Breath sounds: Normal breath sounds.  ?Abdominal:  ?   General: Bowel sounds are normal. There is distension.  ?   Palpations: Abdomen is soft.  ?Musculoskeletal:     ?   General: Normal range of motion.  ?   Cervical back: Normal range of motion.  ?Skin: ?   General: Skin is warm and dry.  ?Neurological:  ?   Mental Status: He is alert and oriented to person, place, and time.  ?Psychiatric:     ?   Mood and Affect: Mood normal.     ?   Behavior: Behavior normal.     ?   Thought Content: Thought content normal.     ?   Judgment: Judgment normal.  ? ? ?ROS ?Comprehensive ROS Pertinent positive and negative noted in HPI   ?Last 3 Office BP readings: ?BP Readings from Last 3 Encounters:  ?06/30/21 (!) 135/93  ?06/01/21 104/70  ?03/01/21 128/90  ? ? ?BMET ?   ?Component Value Date/Time  ? NA 133 (L) 06/01/2021 0921  ? K 4.4 06/01/2021 0921  ? CL 94 (L) 06/01/2021 0921  ? CO2 17 (L) 06/01/2021 0921  ? GLUCOSE 106 (H) 06/01/2021 07/30/2021  ? GLUCOSE 96 10/31/2020 2303  ? BUN 73 (H) 06/01/2021 0921  ? CREATININE 6.62 (H) 06/01/2021 07/30/2021  ? CALCIUM 9.5 06/01/2021 0921  ? GFRNONAA 25 (L) 10/31/2020 2303  ? GFRAA 48 (L) 05/12/2020 1534  ? ? ?Renal function: ?CrCl cannot be calculated (Patient's most recent lab result is older than the maximum 21 days allowed.). ? ?Clinical ASCVD: No  ?The ASCVD Risk score (Arnett DK, et al., 2019) failed to calculate for the following reasons: ?  The valid total cholesterol range is 130 to 320 mg/dL ? ?ASCVD risk factors include- 2020 ? ? ?Tony Dean was seen today for blood pressure check. ? ?Diagnoses and all orders for this visit: ? ?Medication refill ?-     amLODipine (NORVASC) 10 MG tablet; Take 1 tablet (10 mg total) by mouth daily. ? ?  ? Hypertension, unspecified type ?-Counseled on lifestyle modifications for blood pressure control including reduced dietary  sodium, increased exercise, weight reduction and adequate sleep. Also, educated patient about the risk for cardiovascular events, stroke and heart attack. Also counseled patient about the importance of medication adherence. If you participate  in smoking, it is important to stop using tobacco as this will increase the risks associated with uncontrolled blood pressure.  ? ?-For patients younger than 60: Goal BP < 130/80. ?For patients 60 and older: Goal BP < 140/90. ?For patients with diabetes: Goal BP < 130/80. ?Your most recent BP: 135/93 ? ?Minimize salt intake. ?Minimize alcohol intake ? ? ? ?This note has been created with Education officer, environmentalDragon speech recognition software and smart phrase technology. Any transcriptional errors are unintentional.  ? ?Grayce SessionsMichelle P Shawnta Zimbelman, NP ?07/01/2021, 11:25 PM ?  ?

## 2021-07-01 ENCOUNTER — Encounter (INDEPENDENT_AMBULATORY_CARE_PROVIDER_SITE_OTHER): Payer: Self-pay | Admitting: Primary Care

## 2021-07-07 ENCOUNTER — Ambulatory Visit: Payer: BC Managed Care – PPO

## 2021-07-11 ENCOUNTER — Ambulatory Visit: Payer: BC Managed Care – PPO

## 2021-07-11 ENCOUNTER — Other Ambulatory Visit: Payer: Self-pay

## 2021-07-11 DIAGNOSIS — M2142 Flat foot [pes planus] (acquired), left foot: Secondary | ICD-10-CM | POA: Diagnosis not present

## 2021-07-11 DIAGNOSIS — M2141 Flat foot [pes planus] (acquired), right foot: Secondary | ICD-10-CM

## 2021-07-11 DIAGNOSIS — E119 Type 2 diabetes mellitus without complications: Secondary | ICD-10-CM | POA: Diagnosis not present

## 2021-07-11 NOTE — Progress Notes (Signed)
SITUATION ?Reason for Visit: Fitting of Diabetic Shoes & Insoles ?Patient / Caregiver Report:  Patient is satisfied with fit and function of shoes and insoles. ? ?OBJECTIVE DATA: ?Patient History / Diagnosis:   ?  ICD-10-CM   ?1. Type 2 diabetes mellitus without complication, without long-term current use of insulin (HCC)  E11.9   ?  ?2. Pes planus of both feet  M21.41   ? M21.42   ?  ? ? ?Change in Status:   None ? ?ACTIONS PERFORMED: ?In-Person Delivery, patient was fit with: ?- 1x pair [id number] PDAC approved prefabricated Diabetic Shoes: Apex [id number] 11W ?- 3x pair [id number] PDAC approved vacuum formed custom diabetic insoles; [employer]: [id number] ? ?Shoes and insoles were verified for structural integrity and safety. Patient wore shoes and insoles in office. Skin was inspected and free of areas of concern after wearing shoes and inserts. Shoes and inserts fit properly. Patient / Caregiver provided with ferbal instruction and demonstration regarding donning, doffing, wear, care, proper fit, function, purpose, cleaning, and use of shoes and insoles ' and in all related precautions and risks and benefits regarding shoes and insoles. Patient / Caregiver was instructed to wear properly fitting socks with shoes at all times. Patient was also provided with verbal instruction regarding how to report any failures or malfunctions of shoes or inserts, and necessary follow up care. Patient / Caregiver was also instructed to contact physician regarding change in status that may affect function of shoes and inserts.  ? ?Patient / Caregiver verbalized undersatnding of instruction provided. Patient / Caregiver demonstrated independence with proper donning and doffing of shoes and inserts. ? ?PLAN ?Patient to follow with treating physician as recommended. Plan of care was discussed with and agreed upon by patient and/or caregiver. All questions were answered and concerns addressed. ? ?

## 2021-08-09 ENCOUNTER — Ambulatory Visit (INDEPENDENT_AMBULATORY_CARE_PROVIDER_SITE_OTHER): Payer: BC Managed Care – PPO | Admitting: Primary Care

## 2021-09-14 ENCOUNTER — Encounter (INDEPENDENT_AMBULATORY_CARE_PROVIDER_SITE_OTHER): Payer: Self-pay | Admitting: Primary Care

## 2021-09-14 ENCOUNTER — Ambulatory Visit (INDEPENDENT_AMBULATORY_CARE_PROVIDER_SITE_OTHER): Payer: BC Managed Care – PPO | Admitting: Primary Care

## 2021-09-14 VITALS — BP 158/116 | HR 91 | Temp 97.9°F | Ht 70.0 in | Wt 199.8 lb

## 2021-09-14 DIAGNOSIS — E119 Type 2 diabetes mellitus without complications: Secondary | ICD-10-CM | POA: Diagnosis not present

## 2021-09-14 DIAGNOSIS — Z23 Encounter for immunization: Secondary | ICD-10-CM | POA: Diagnosis not present

## 2021-09-14 DIAGNOSIS — Z760 Encounter for issue of repeat prescription: Secondary | ICD-10-CM | POA: Diagnosis not present

## 2021-09-14 DIAGNOSIS — D509 Iron deficiency anemia, unspecified: Secondary | ICD-10-CM

## 2021-09-14 DIAGNOSIS — E7841 Elevated Lipoprotein(a): Secondary | ICD-10-CM

## 2021-09-14 DIAGNOSIS — I10 Essential (primary) hypertension: Secondary | ICD-10-CM

## 2021-09-14 LAB — POCT GLYCOSYLATED HEMOGLOBIN (HGB A1C): Hemoglobin A1C: 6.2 % — AB (ref 4.0–5.6)

## 2021-09-14 MED ORDER — AMLODIPINE BESYLATE 10 MG PO TABS: 10.0000 mg | ORAL_TABLET | Freq: Every day | ORAL | 1 refills | Status: DC

## 2021-09-14 MED ORDER — ATORVASTATIN CALCIUM 10 MG PO TABS: 10.0000 mg | ORAL_TABLET | Freq: Every day | ORAL | 1 refills | Status: DC

## 2021-09-14 NOTE — Progress Notes (Signed)
Subjective:  Patient ID: Tony Dean, male    DOB: [birth year]  Age: 61 y.o. MRN: [id number]  CC: Diabetes and Hypertension   HPI Tony Dean presents for Follow-up of diabetes. Patient does not check blood sugar at home.  Also the management of hypertension.Denies shortness of breath, headaches, chest pain or lower extremity edema.  Reviewed medication and did not have amlodipine nor was he taking carvedilol twice daily but daily.  Compliant with meds - Yes Checking CBGs? No  Fasting avg -   Postprandial average -  Exercising regularly? - Yes Watching carbohydrate intake? - Yes Neuropathy ? - No Hypoglycemic events - No  - Recovers with :   Pertinent ROS:  Polyuria - No Polydipsia - No Vision problems - No  Medications as noted below. Taking them regularly without complication/adverse reaction being reported today.   History Tony Dean has a past medical history of Abnormal electrocardiogram (ECG) (EKG), Dehydration, Dizziness, DM (diabetes mellitus) (HCC), ED (erectile dysfunction), Gout, Hypertension, Lymphangitis of groin, Scrotal pain, Sprain of left wrist, and Urinary retention.   He has a past surgical history that includes Appendectomy and Inguinal hernia repair (1990's).   His family history includes Healthy in his mother; Hypertension in his father.He reports that he has never smoked. He has never used smokeless tobacco. He reports current alcohol use of about 22.0 - 24.0 standard drinks per week. He reports that he does not use drugs.  Current Outpatient Medications on File Prior to Visit  Medication Sig Dispense Refill   carvedilol (COREG) 6.25 MG tablet Take 1 tablet (6.25 mg total) by mouth 2 (two) times daily with a meal. 180 tablet 1   empagliflozin (JARDIANCE) 10 MG TABS tablet Take 1 tablet (10 mg total) by mouth daily before breakfast. 90 tablet 1   losartan (COZAAR) 25 MG tablet Take 1 tablet (25 mg total) by mouth daily. 90 tablet 1   meloxicam  (MOBIC) 7.5 MG tablet Take 1 tablet (7.5 mg total) by mouth daily as needed for pain. 30 tablet 0   naproxen (NAPROSYN) 500 MG tablet Take 500 mg by mouth 2 (two) times daily.     metFORMIN (GLUCOPHAGE XR) 500 MG 24 hr tablet Take 1 tablet (500 mg total) by mouth daily with breakfast. (Patient not taking: Reported on 09/14/2021) 90 tablet 1   No current facility-administered medications on file prior to visit.    ROS Comprehensive ROS Pertinent positive and negative noted in HPI    Objective:  BP (!) 158/116   Pulse 91   Temp 97.9 F (36.6 C) (Oral)   Ht 5\' 10"  (1.778 m)   Wt 199 lb 12.8 oz (90.6 kg)   SpO2 95%   BMI 28.67 kg/m   BP Readings from Last 3 Encounters:  09/14/21 (!) 158/116  06/30/21 (!) 135/93  06/01/21 104/70    Wt Readings from Last 3 Encounters:  09/14/21 199 lb 12.8 oz (90.6 kg)  06/30/21 206 lb 6.4 oz (93.6 kg)  03/01/21 202 lb 9.6 oz (91.9 kg)    Physical Exam General: No apparent distress. Eyes: Extraocular eye movements intact, pupils equal and round. Neck: Supple, trachea midline. Thyroid: No enlargement, mobile without fixation, no tenderness. Cardiovascular: Regular rhythm and rate, no murmur, normal radial pulses. Respiratory: Normal respiratory effort, clear to auscultation. Gastrointestinal: Normal pitch active bowel sounds, nontender abdomen without distention or appreciable hepatomegaly. Musculoskeletal: Normal muscle tone, no tenderness on palpation of tibia, no excessive thoracic kyphosis. Skin: Appropriate warmth, no visible rash. Mental  status: Alert, conversant, speech clear, thought logical, appropriate mood and affect, no hallucinations or delusions evident. Hematologic/lymphatic: No cervical adenopathy, no visible ecchymoses.  Lab Results  Component Value Date   HGBA1C 6.2 (A) 09/14/2021   HGBA1C 6.7 (A) 06/01/2021   HGBA1C 7.3 (A) 03/01/2021    Lab Results  Component Value Date   WBC 8.7 06/01/2021   HGB 9.9 (L) 06/01/2021    HCT 29.3 (L) 06/01/2021   PLT 562 (H) 06/01/2021   GLUCOSE 106 (H) 06/01/2021   CHOL 121 06/01/2021   TRIG 93 06/01/2021   HDL 37 (L) 06/01/2021   LDLCALC 66 06/01/2021   ALT 8 06/01/2021   AST 7 06/01/2021   NA 133 (L) 06/01/2021   K 4.4 06/01/2021   CL 94 (L) 06/01/2021   CREATININE 6.62 (H) 06/01/2021   BUN 73 (H) 06/01/2021   CO2 17 (L) 06/01/2021   TSH 1.760 05/12/2020   HGBA1C 6.2 (A) 09/14/2021     Assessment & Plan:  Tony Dean was seen today for diabetes and hypertension.  Diagnoses and all orders for this visit:  Type 2 diabetes mellitus without complication, without long-term current use of insulin (HCC) -     HgB A1c 6.2.  No changes in medication we will continue metformin 500 mg XR daily and Jardiance 10 mg daily Monitor foods that are high in carbohydrates are the following rice, potatoes, breads, sugars, and pastas.  Reduction in the intake (eating) will assist in lowering your blood sugars.   Need for shingles vaccine -     Varicella-zoster vaccine IM (Shingrix)  Hypertension, unspecified type Review of medication indicated not taking correctly and without amlodipine will have him to return in 6 weeks on all medication to reevaluate blood pressure Counseled on blood pressure goal of less than 130/80, low-sodium, DASH diet, medication compliance, 150 minutes of moderate intensity exercise per week. Discussed medication compliance, adverse effects.  -     amLODipine (NORVASC) 10 MG tablet; Take 1 tablet (10 mg total) by mouth daily.  Medication refill -     amLODipine (NORVASC) 10 MG tablet; Take 1 tablet (10 mg total) by mouth daily.  Elevated lipoprotein(a)  Healthy lifestyle diet of fruits vegetables fish nuts whole grains and low saturated fat . Foods high in cholesterol or liver, fatty meats,cheese, butter avocados, nuts and seeds, chocolate and fried foods. -     atorvastatin (LIPITOR) 10 MG tablet; Take 1 tablet (10 mg total) by mouth daily.  Iron  deficiency anemia, unspecified iron deficiency anemia type -     CBC with Differential   I have discontinued Tony Dean's allopurinol, clotrimazole, and tadalafil. I have also changed his atorvastatin. Additionally, I am having him maintain his metFORMIN, naproxen, losartan, carvedilol, empagliflozin, meloxicam, and amLODipine.  Meds ordered this encounter  Medications   amLODipine (NORVASC) 10 MG tablet    Sig: Take 1 tablet (10 mg total) by mouth daily.    Dispense:  90 tablet    Refill:  1   atorvastatin (LIPITOR) 10 MG tablet    Sig: Take 1 tablet (10 mg total) by mouth daily.    Dispense:  90 tablet    Refill:  1     Follow-up:   Return in about 6 weeks (around 10/26/2021) for Bp ck .  The above assessment and management plan was discussed with the patient. The patient verbalized understanding of and has agreed to the management plan. Patient is aware to call the clinic if symptoms fail  to improve or worsen. Patient is aware when to return to the clinic for a follow-up visit. Patient educated on when it is appropriate to go to the emergency department.   Gwinda PasseMichelle Raiza Kiesel, NP-C

## 2021-09-15 LAB — CBC WITH DIFFERENTIAL/PLATELET
Basophils Absolute: 0 10*3/uL (ref 0.0–0.2)
Basos: 1 %
EOS (ABSOLUTE): 0.1 10*3/uL (ref 0.0–0.4)
Eos: 2 %
Hematocrit: 36.2 % — ABNORMAL LOW (ref 37.5–51.0)
Hemoglobin: 12.4 g/dL — ABNORMAL LOW (ref 13.0–17.7)
Immature Grans (Abs): 0 10*3/uL (ref 0.0–0.1)
Immature Granulocytes: 0 %
Lymphocytes Absolute: 1.4 10*3/uL (ref 0.7–3.1)
Lymphs: 23 %
MCH: 31.6 pg (ref 26.6–33.0)
MCHC: 34.3 g/dL (ref 31.5–35.7)
MCV: 92 fL (ref 79–97)
Monocytes Absolute: 0.6 10*3/uL (ref 0.1–0.9)
Monocytes: 9 %
Neutrophils Absolute: 4.1 10*3/uL (ref 1.4–7.0)
Neutrophils: 65 %
Platelets: 189 10*3/uL (ref 150–450)
RBC: 3.93 x10E6/uL — ABNORMAL LOW (ref 4.14–5.80)
RDW: 14.9 % (ref 11.6–15.4)
WBC: 6.2 10*3/uL (ref 3.4–10.8)

## 2021-10-31 ENCOUNTER — Encounter (INDEPENDENT_AMBULATORY_CARE_PROVIDER_SITE_OTHER): Payer: Self-pay | Admitting: Primary Care

## 2021-10-31 ENCOUNTER — Ambulatory Visit (INDEPENDENT_AMBULATORY_CARE_PROVIDER_SITE_OTHER): Payer: BC Managed Care – PPO | Admitting: Primary Care

## 2021-10-31 VITALS — BP 122/83 | HR 91 | Temp 97.7°F | Ht 70.0 in | Wt 191.0 lb

## 2021-10-31 DIAGNOSIS — Z760 Encounter for issue of repeat prescription: Secondary | ICD-10-CM | POA: Diagnosis not present

## 2021-10-31 DIAGNOSIS — I10 Essential (primary) hypertension: Secondary | ICD-10-CM

## 2021-10-31 MED ORDER — LOSARTAN POTASSIUM 25 MG PO TABS: 25.0000 mg | ORAL_TABLET | Freq: Every day | ORAL | 1 refills | Status: DC

## 2021-10-31 MED ORDER — CARVEDILOL 6.25 MG PO TABS: 6.2500 mg | ORAL_TABLET | Freq: Two times a day (BID) | ORAL | 1 refills | Status: DC

## 2021-10-31 MED ORDER — AMLODIPINE BESYLATE 10 MG PO TABS: 10.0000 mg | ORAL_TABLET | Freq: Every day | ORAL | 1 refills | Status: DC

## 2021-10-31 NOTE — Progress Notes (Signed)
[facility]   Tony Dean is a 61 y.o. male presents for hypertension evaluation, Denies shortness of breath, headaches, chest pain or lower extremity edema, sudden onset, vision changes, unilateral weakness, , paresthesias . Reports having a little light headiness yesterday and left work. Bp is unremarkable but had not eaten or drank fluids.  Patient reports adherence with medications.  Dietary habits include: Monitoring sodium Exercise habits include:Yes Family / Social history: Father HTN   Past Medical History:  Diagnosis Date   Abnormal electrocardiogram (ECG) (EKG)    Dehydration    Dizziness    DM (diabetes mellitus) (HCC)    ED (erectile dysfunction)    Gout    Hypertension    Lymphangitis of groin    Scrotal pain    Sprain of left wrist    Urinary retention    Past Surgical History:  Procedure Laterality Date   APPENDECTOMY     INGUINAL HERNIA REPAIR  1990's   right   Allergies  Allergen Reactions   Lisinopril Cough   Current Outpatient Medications on File Prior to Visit  Medication Sig Dispense Refill   amLODipine (NORVASC) 10 MG tablet Take 1 tablet (10 mg total) by mouth daily. 90 tablet 1   atorvastatin (LIPITOR) 10 MG tablet Take 1 tablet (10 mg total) by mouth daily. 90 tablet 1   carvedilol (COREG) 6.25 MG tablet Take 1 tablet (6.25 mg total) by mouth 2 (two) times daily with a meal. 180 tablet 1   empagliflozin (JARDIANCE) 10 MG TABS tablet Take 1 tablet (10 mg total) by mouth daily before breakfast. 90 tablet 1   losartan (COZAAR) 25 MG tablet Take 1 tablet (25 mg total) by mouth daily. 90 tablet 1   metFORMIN (GLUCOPHAGE XR) 500 MG 24 hr tablet Take 1 tablet (500 mg total) by mouth daily with breakfast. 90 tablet 1   naproxen (NAPROSYN) 500 MG tablet Take 500 mg by mouth 2 (two) times daily.     meloxicam (MOBIC) 7.5 MG tablet Take 1 tablet (7.5 mg total) by mouth daily as needed for pain. 30 tablet 0   No current  facility-administered medications on file prior to visit.   Social History   Socioeconomic History   Marital status: Legally Separated    Spouse name: Not on file   Number of children: Not on file   Years of education: Not on file   Highest education level: Not on file  Occupational History   Not on file  Tobacco Use   Smoking status: Never   Smokeless tobacco: Never  Substance and Sexual Activity   Alcohol use: Yes    Alcohol/week: 22.0 - 24.0 standard drinks of alcohol    Types: 14 Cans of beer, 8 - 10 Shots of liquor per week    Comment: reports drinks daily   Drug use: No   Sexual activity: Not Currently  Other Topics Concern   Not on file  Social History Narrative   Not on file   Social Determinants of Health   Financial Resource Strain: Not on file  Food Insecurity: Not on file  Transportation Needs: Not on file  Physical Activity: Not on file  Stress: Not on file  Social Connections: Not on file  Intimate Partner Violence: Not on file   Family History  Problem Relation Age of Onset   Healthy Mother    Hypertension Father    Colon cancer Neg Hx      OBJECTIVE:  Vitals:  10/31/21 0917  BP: 122/83  Pulse: 91  Temp: 97.7 F (36.5 C)  TempSrc: Oral  SpO2: 95%  Weight: 191 lb (86.6 kg)  Height: 5\' 10"  (1.778 m)    Physical Exam Vitals reviewed.  Constitutional:      Appearance: Normal appearance.  HENT:     Head: Normocephalic.     Right Ear: Tympanic membrane and external ear normal.     Left Ear: Tympanic membrane and external ear normal.     Nose: Nose normal.  Eyes:     Extraocular Movements: Extraocular movements intact.     Pupils: Pupils are equal, round, and reactive to light.  Cardiovascular:     Pulses: Normal pulses.     Heart sounds: Normal heart sounds.  Pulmonary:     Effort: Pulmonary effort is normal.     Breath sounds: Normal breath sounds.  Abdominal:     General: Bowel sounds are normal.     Palpations: Abdomen is  soft.  Skin:    General: Skin is warm and dry.  Neurological:     Mental Status: He is alert and oriented to person, place, and time.  Psychiatric:        Mood and Affect: Mood normal.        Behavior: Behavior normal.    ROS Comprehensive ROS Pertinent positive and negative noted in HPI   Last 3 Office BP readings: BP Readings from Last 3 Encounters:  10/31/21 122/83  09/14/21 (!) 158/116  06/30/21 (!) 135/93    BMET    Component Value Date/Time   NA 133 (L) 06/01/2021 0921   K 4.4 06/01/2021 0921   CL 94 (L) 06/01/2021 0921   CO2 17 (L) 06/01/2021 0921   GLUCOSE 106 (H) 06/01/2021 0921   GLUCOSE 96 10/31/2020 2303   BUN 73 (H) 06/01/2021 0921   CREATININE 6.62 (H) 06/01/2021 0921   CALCIUM 9.5 06/01/2021 0921   GFRNONAA 25 (L) 10/31/2020 2303   GFRAA 48 (L) 05/12/2020 1534    Renal function: CrCl cannot be calculated (Patient's most recent lab result is older than the maximum 21 days allowed.).  Clinical ASCVD: No  The ASCVD Risk score (Arnett DK, et al., 2019) failed to calculate for the following reasons:   The valid total cholesterol range is 130 to 320 mg/dL  ASCVD risk factors include- [place]   ASSESSMENT & PLAN: Serjio was seen today for hypertension.  Diagnoses and all orders for this visit:  Hypertension, unspecified type -Counseled on lifestyle modifications for blood pressure control including reduced dietary sodium, increased exercise, weight reduction and adequate sleep. Also, educated patient about the risk for cardiovascular events, stroke and heart attack. Also counseled patient about the importance of medication adherence. If you participate in smoking, it is important to stop using tobacco as this will increase the risks associated with uncontrolled blood pressure.   -Goal BP:  For patients younger than 60: Goal BP < 130/80. For patients 60 and older: Goal BP < 140/90. For patients with diabetes: Goal BP < 130/80. Your most recent BP:  123/83  Minimize salt intake. Minimize alcohol intake  Medication refill -     losartan (COZAAR) 25 MG tablet; Take 1 tablet (25 mg total) by mouth daily. -     amLODipine (NORVASC) 10 MG tablet; Take 1 tablet (10 mg total) by mouth daily. -     carvedilol (COREG) 6.25 MG tablet; Take 1 tablet (6.25 mg total) by mouth 2 (two) times daily with  a meal.      This note has been created with Education officer, environmental. Any transcriptional errors are unintentional.   Grayce Sessions, NP 10/31/2021, 9:21 AM

## 2021-12-19 ENCOUNTER — Emergency Department (HOSPITAL_COMMUNITY): Payer: BC Managed Care – PPO

## 2021-12-19 ENCOUNTER — Inpatient Hospital Stay (HOSPITAL_COMMUNITY)
Admission: EM | Admit: 2021-12-19 | Discharge: 2021-12-23 | DRG: 554 | Disposition: A | Discharge status: Home / Self Care | Payer: BC Managed Care – PPO | Attending: Internal Medicine | Admitting: Internal Medicine

## 2021-12-19 ENCOUNTER — Other Ambulatory Visit: Payer: Self-pay

## 2021-12-19 ENCOUNTER — Encounter (HOSPITAL_COMMUNITY): Payer: Self-pay | Admitting: Emergency Medicine

## 2021-12-19 ENCOUNTER — Emergency Department (HOSPITAL_BASED_OUTPATIENT_CLINIC_OR_DEPARTMENT_OTHER): Payer: BC Managed Care – PPO

## 2021-12-19 DIAGNOSIS — M2540 Effusion, unspecified joint: Secondary | ICD-10-CM

## 2021-12-19 DIAGNOSIS — E1165 Type 2 diabetes mellitus with hyperglycemia: Secondary | ICD-10-CM | POA: Diagnosis present

## 2021-12-19 DIAGNOSIS — M79604 Pain in right leg: Secondary | ICD-10-CM

## 2021-12-19 DIAGNOSIS — Z8249 Family history of ischemic heart disease and other diseases of the circulatory system: Secondary | ICD-10-CM

## 2021-12-19 DIAGNOSIS — Z8739 Personal history of other diseases of the musculoskeletal system and connective tissue: Secondary | ICD-10-CM | POA: Diagnosis not present

## 2021-12-19 DIAGNOSIS — M10061 Idiopathic gout, right knee: Principal | ICD-10-CM | POA: Diagnosis present

## 2021-12-19 DIAGNOSIS — N179 Acute kidney failure, unspecified: Secondary | ICD-10-CM | POA: Diagnosis present

## 2021-12-19 DIAGNOSIS — I10 Essential (primary) hypertension: Secondary | ICD-10-CM | POA: Diagnosis not present

## 2021-12-19 DIAGNOSIS — F1020 Alcohol dependence, uncomplicated: Secondary | ICD-10-CM | POA: Diagnosis not present

## 2021-12-19 DIAGNOSIS — I129 Hypertensive chronic kidney disease with stage 1 through stage 4 chronic kidney disease, or unspecified chronic kidney disease: Secondary | ICD-10-CM | POA: Diagnosis present

## 2021-12-19 DIAGNOSIS — M109 Gout, unspecified: Secondary | ICD-10-CM

## 2021-12-19 DIAGNOSIS — G629 Polyneuropathy, unspecified: Secondary | ICD-10-CM

## 2021-12-19 DIAGNOSIS — Z91148 Patient's other noncompliance with medication regimen for other reason: Secondary | ICD-10-CM

## 2021-12-19 DIAGNOSIS — Z20822 Contact with and (suspected) exposure to covid-19: Secondary | ICD-10-CM | POA: Diagnosis present

## 2021-12-19 DIAGNOSIS — Z888 Allergy status to other drugs, medicaments and biological substances status: Secondary | ICD-10-CM

## 2021-12-19 DIAGNOSIS — E1140 Type 2 diabetes mellitus with diabetic neuropathy, unspecified: Secondary | ICD-10-CM | POA: Diagnosis present

## 2021-12-19 DIAGNOSIS — Z7984 Long term (current) use of oral hypoglycemic drugs: Secondary | ICD-10-CM

## 2021-12-19 DIAGNOSIS — M25561 Pain in right knee: Secondary | ICD-10-CM | POA: Diagnosis not present

## 2021-12-19 DIAGNOSIS — Z79899 Other long term (current) drug therapy: Secondary | ICD-10-CM

## 2021-12-19 DIAGNOSIS — A419 Sepsis, unspecified organism: Principal | ICD-10-CM | POA: Diagnosis present

## 2021-12-19 DIAGNOSIS — E1122 Type 2 diabetes mellitus with diabetic chronic kidney disease: Secondary | ICD-10-CM | POA: Diagnosis present

## 2021-12-19 DIAGNOSIS — E119 Type 2 diabetes mellitus without complications: Secondary | ICD-10-CM

## 2021-12-19 DIAGNOSIS — R6510 Systemic inflammatory response syndrome (SIRS) of non-infectious origin without acute organ dysfunction: Secondary | ICD-10-CM | POA: Diagnosis present

## 2021-12-19 DIAGNOSIS — N1832 Chronic kidney disease, stage 3b: Secondary | ICD-10-CM | POA: Diagnosis present

## 2021-12-19 DIAGNOSIS — E872 Acidosis, unspecified: Secondary | ICD-10-CM | POA: Diagnosis present

## 2021-12-19 LAB — SYNOVIAL CELL COUNT + DIFF, W/ CRYSTALS
Eosinophils-Synovial: 0 % (ref 0–1)
Lymphocytes-Synovial Fld: 1 % (ref 0–20)
Monocyte-Macrophage-Synovial Fluid: 4 % — ABNORMAL LOW (ref 50–90)
Neutrophil, Synovial: 95 % — ABNORMAL HIGH (ref 0–25)
WBC, Synovial: 23625 /mm3 — ABNORMAL HIGH (ref 0–200)

## 2021-12-19 LAB — I-STAT CHEM 8, ED
BUN: 22 mg/dL — ABNORMAL HIGH (ref 6–20)
Calcium, Ion: 1.14 mmol/L — ABNORMAL LOW (ref 1.15–1.40)
Chloride: 100 mmol/L (ref 98–111)
Creatinine, Ser: 2.4 mg/dL — ABNORMAL HIGH (ref 0.61–1.24)
Glucose, Bld: 153 mg/dL — ABNORMAL HIGH (ref 70–99)
HCT: 36 % — ABNORMAL LOW (ref 39.0–52.0)
Hemoglobin: 12.2 g/dL — ABNORMAL LOW (ref 13.0–17.0)
Potassium: 3.6 mmol/L (ref 3.5–5.1)
Sodium: 138 mmol/L (ref 135–145)
TCO2: 23 mmol/L (ref 22–32)

## 2021-12-19 LAB — LACTIC ACID, PLASMA
Lactic Acid, Venous: 2.8 mmol/L (ref 0.5–1.9)
Lactic Acid, Venous: 1.5 mmol/L (ref 0.5–1.9)

## 2021-12-19 LAB — GRAM STAIN

## 2021-12-19 LAB — COMPREHENSIVE METABOLIC PANEL
ALT: 13 U/L (ref 0–44)
AST: 17 U/L (ref 15–41)
Albumin: 3.1 g/dL — ABNORMAL LOW (ref 3.5–5.0)
Alkaline Phosphatase: 71 U/L (ref 38–126)
Anion gap: 16 — ABNORMAL HIGH (ref 5–15)
BUN: 23 mg/dL — ABNORMAL HIGH (ref 6–20)
CO2: 21 mmol/L — ABNORMAL LOW (ref 22–32)
Calcium: 9.6 mg/dL (ref 8.9–10.3)
Chloride: 99 mmol/L (ref 98–111)
Creatinine, Ser: 2.75 mg/dL — ABNORMAL HIGH (ref 0.61–1.24)
GFR, Estimated: 26 mL/min — ABNORMAL LOW (ref >60)
Glucose, Bld: 153 mg/dL — ABNORMAL HIGH (ref 70–99)
Potassium: 3.6 mmol/L (ref 3.5–5.1)
Sodium: 136 mmol/L (ref 135–145)
Total Bilirubin: 1.6 mg/dL — ABNORMAL HIGH (ref 0.3–1.2)
Total Protein: 9 g/dL — ABNORMAL HIGH (ref 6.5–8.1)

## 2021-12-19 LAB — SEDIMENTATION RATE: Sed Rate: 111 mm/hr — ABNORMAL HIGH (ref 0–16)

## 2021-12-19 LAB — C-REACTIVE PROTEIN: CRP: 36.3 mg/dL — ABNORMAL HIGH (ref <1.0)

## 2021-12-19 LAB — URINALYSIS, ROUTINE W REFLEX MICROSCOPIC
Bacteria, UA: NONE SEEN
Bilirubin Urine: NEGATIVE
Glucose, UA: 150 mg/dL — AB
Ketones, ur: 5 mg/dL — AB
Leukocytes,Ua: NEGATIVE
Nitrite: NEGATIVE
Protein, ur: 100 mg/dL — AB
Specific Gravity, Urine: 1.02 (ref 1.005–1.030)
pH: 5 (ref 5.0–8.0)

## 2021-12-19 LAB — CBC WITH DIFFERENTIAL/PLATELET
Abs Immature Granulocytes: 0.04 10*3/uL (ref 0.00–0.07)
Basophils Absolute: 0 10*3/uL (ref 0.0–0.1)
Basophils Relative: 0 %
Eosinophils Absolute: 0 10*3/uL (ref 0.0–0.5)
Eosinophils Relative: 0 %
HCT: 33.4 % — ABNORMAL LOW (ref 39.0–52.0)
Hemoglobin: 11 g/dL — ABNORMAL LOW (ref 13.0–17.0)
Immature Granulocytes: 0 %
Lymphocytes Relative: 7 %
Lymphs Abs: 0.9 10*3/uL (ref 0.7–4.0)
MCH: 32.3 pg (ref 26.0–34.0)
MCHC: 32.9 g/dL (ref 30.0–36.0)
MCV: 97.9 fL (ref 80.0–100.0)
Monocytes Absolute: 1.4 10*3/uL — ABNORMAL HIGH (ref 0.1–1.0)
Monocytes Relative: 11 %
Neutro Abs: 9.9 10*3/uL — ABNORMAL HIGH (ref 1.7–7.7)
Neutrophils Relative %: 82 %
Platelets: 250 10*3/uL (ref 150–400)
RBC: 3.41 MIL/uL — ABNORMAL LOW (ref 4.22–5.81)
RDW: 13.7 % (ref 11.5–15.5)
WBC: 12.3 10*3/uL — ABNORMAL HIGH (ref 4.0–10.5)
nRBC: 0 % (ref 0.0–0.2)

## 2021-12-19 LAB — RESP PANEL BY RT-PCR (FLU A&B, COVID) ARPGX2
Influenza A by PCR: NEGATIVE
Influenza B by PCR: NEGATIVE
SARS Coronavirus 2 by RT PCR: NEGATIVE

## 2021-12-19 LAB — PROTIME-INR
INR: 1.1 (ref 0.8–1.2)
Prothrombin Time: 14.4 seconds (ref 11.4–15.2)

## 2021-12-19 LAB — ETHANOL: Alcohol, Ethyl (B): <10 mg/dL (ref <10)

## 2021-12-19 LAB — APTT: aPTT: 35 seconds (ref 24–36)

## 2021-12-19 MED ORDER — LACTATED RINGERS IV SOLN: INTRAVENOUS | Status: DC

## 2021-12-19 MED ORDER — LACTATED RINGERS IV BOLUS (SEPSIS)
1000.0000 mL | Freq: Once | INTRAVENOUS | Status: AC
  Administered 2021-12-19: 1000 mL via INTRAVENOUS

## 2021-12-19 MED ORDER — FENTANYL CITRATE PF 50 MCG/ML IJ SOSY
100.0000 ug | PREFILLED_SYRINGE | Freq: Once | INTRAMUSCULAR | Status: AC
  Administered 2021-12-19: 100 ug via INTRAVENOUS
  Filled 2021-12-19: qty 2

## 2021-12-19 MED ORDER — ONDANSETRON HCL 4 MG PO TABS: 4.0000 mg | ORAL_TABLET | Freq: Four times a day (QID) | ORAL | Status: DC | PRN

## 2021-12-19 MED ORDER — ACETAMINOPHEN 325 MG PO TABS
650.0000 mg | ORAL_TABLET | Freq: Four times a day (QID) | ORAL | Status: DC | PRN
  Administered 2021-12-21: 650 mg via ORAL
  Filled 2021-12-19: qty 2

## 2021-12-19 MED ORDER — VANCOMYCIN HCL 1750 MG/350ML IV SOLN
1750.0000 mg | Freq: Once | INTRAVENOUS | Status: AC
  Administered 2021-12-19: 1750 mg via INTRAVENOUS
  Filled 2021-12-19: qty 350

## 2021-12-19 MED ORDER — THIAMINE HCL 100 MG/ML IJ SOLN
100.0000 mg | Freq: Every day | INTRAMUSCULAR | Status: DC
  Filled 2021-12-19 (×3): qty 2

## 2021-12-19 MED ORDER — ADULT MULTIVITAMIN W/MINERALS CH
1.0000 | ORAL_TABLET | Freq: Every day | ORAL | Status: DC
  Administered 2021-12-19 – 2021-12-23 (×5): 1 via ORAL
  Filled 2021-12-19 (×5): qty 1

## 2021-12-19 MED ORDER — LORAZEPAM 1 MG PO TABS: 1.0000 mg | ORAL_TABLET | ORAL | Status: AC | PRN

## 2021-12-19 MED ORDER — EMPAGLIFLOZIN 10 MG PO TABS
10.0000 mg | ORAL_TABLET | Freq: Every day | ORAL | Status: DC
  Administered 2021-12-20 – 2021-12-23 (×4): 10 mg via ORAL
  Filled 2021-12-19 (×4): qty 1

## 2021-12-19 MED ORDER — LACTATED RINGERS IV SOLN: INTRAVENOUS | Status: AC

## 2021-12-19 MED ORDER — ATORVASTATIN CALCIUM 10 MG PO TABS
10.0000 mg | ORAL_TABLET | Freq: Every day | ORAL | Status: DC
  Administered 2021-12-19 – 2021-12-22 (×4): 10 mg via ORAL
  Filled 2021-12-19 (×4): qty 1

## 2021-12-19 MED ORDER — LORAZEPAM 2 MG/ML IJ SOLN: 1.0000 mg | INTRAMUSCULAR | Status: AC | PRN

## 2021-12-19 MED ORDER — LOSARTAN POTASSIUM 50 MG PO TABS
25.0000 mg | ORAL_TABLET | Freq: Every day | ORAL | Status: DC
  Administered 2021-12-19 – 2021-12-23 (×5): 25 mg via ORAL
  Filled 2021-12-19 (×5): qty 1

## 2021-12-19 MED ORDER — INSULIN ASPART 100 UNIT/ML IJ SOLN
0.0000 [IU] | Freq: Three times a day (TID) | INTRAMUSCULAR | Status: DC
  Administered 2021-12-20 – 2021-12-21 (×5): 3 [IU] via SUBCUTANEOUS
  Administered 2021-12-21: 7 [IU] via SUBCUTANEOUS
  Administered 2021-12-22: 5 [IU] via SUBCUTANEOUS
  Administered 2021-12-22: 2 [IU] via SUBCUTANEOUS
  Administered 2021-12-22: 5 [IU] via SUBCUTANEOUS
  Administered 2021-12-23 (×2): 3 [IU] via SUBCUTANEOUS

## 2021-12-19 MED ORDER — CARVEDILOL 6.25 MG PO TABS
6.2500 mg | ORAL_TABLET | Freq: Two times a day (BID) | ORAL | Status: DC
  Administered 2021-12-20 – 2021-12-23 (×7): 6.25 mg via ORAL
  Filled 2021-12-19 (×7): qty 1

## 2021-12-19 MED ORDER — FOLIC ACID 1 MG PO TABS
1.0000 mg | ORAL_TABLET | Freq: Every day | ORAL | Status: DC
  Administered 2021-12-19 – 2021-12-23 (×5): 1 mg via ORAL
  Filled 2021-12-19 (×5): qty 1

## 2021-12-19 MED ORDER — HYDRALAZINE HCL 20 MG/ML IJ SOLN: 10.0000 mg | Freq: Four times a day (QID) | INTRAMUSCULAR | Status: DC | PRN

## 2021-12-19 MED ORDER — METRONIDAZOLE 500 MG/100ML IV SOLN
500.0000 mg | Freq: Once | INTRAVENOUS | Status: AC
  Administered 2021-12-19: 500 mg via INTRAVENOUS
  Filled 2021-12-19: qty 100

## 2021-12-19 MED ORDER — ONDANSETRON HCL 4 MG/2ML IJ SOLN: 4.0000 mg | Freq: Four times a day (QID) | INTRAMUSCULAR | Status: DC | PRN

## 2021-12-19 MED ORDER — COLCHICINE 0.3 MG HALF TABLET
0.3000 mg | ORAL_TABLET | Freq: Two times a day (BID) | ORAL | Status: AC
  Administered 2021-12-19 – 2021-12-22 (×6): 0.3 mg via ORAL
  Filled 2021-12-19 (×6): qty 1

## 2021-12-19 MED ORDER — SODIUM CHLORIDE 0.9 % IV SOLN
2.0000 g | Freq: Once | INTRAVENOUS | Status: AC
  Administered 2021-12-19: 2 g via INTRAVENOUS
  Filled 2021-12-19: qty 12.5

## 2021-12-19 MED ORDER — OXYCODONE HCL 5 MG PO TABS
5.0000 mg | ORAL_TABLET | ORAL | Status: DC | PRN
  Administered 2021-12-21: 5 mg via ORAL
  Filled 2021-12-19: qty 1

## 2021-12-19 MED ORDER — CHLORDIAZEPOXIDE HCL 25 MG PO CAPS
25.0000 mg | ORAL_CAPSULE | Freq: Once | ORAL | Status: AC
  Administered 2021-12-19: 25 mg via ORAL
  Filled 2021-12-19: qty 1

## 2021-12-19 MED ORDER — THIAMINE MONONITRATE 100 MG PO TABS
100.0000 mg | ORAL_TABLET | Freq: Every day | ORAL | Status: DC
  Administered 2021-12-19 – 2021-12-23 (×5): 100 mg via ORAL
  Filled 2021-12-19 (×7): qty 1

## 2021-12-19 MED ORDER — VANCOMYCIN HCL IN DEXTROSE 1-5 GM/200ML-% IV SOLN
1000.0000 mg | Freq: Once | INTRAVENOUS | Status: DC
  Filled 2021-12-19: qty 200

## 2021-12-19 MED ORDER — LIDOCAINE-EPINEPHRINE (PF) 2 %-1:200000 IJ SOLN
10.0000 mL | Freq: Once | INTRAMUSCULAR | Status: AC
  Administered 2021-12-19: 10 mL via INTRADERMAL

## 2021-12-19 MED ORDER — METHYLPREDNISOLONE SODIUM SUCC 125 MG IJ SOLR
125.0000 mg | INTRAMUSCULAR | Status: DC
  Administered 2021-12-19 – 2021-12-20 (×2): 125 mg via INTRAVENOUS
  Filled 2021-12-19 (×2): qty 2

## 2021-12-19 MED ORDER — SODIUM CHLORIDE 0.9 % IV SOLN
2.0000 g | Freq: Two times a day (BID) | INTRAVENOUS | Status: DC
  Administered 2021-12-20 – 2021-12-21 (×3): 2 g via INTRAVENOUS
  Filled 2021-12-19 (×3): qty 12.5

## 2021-12-19 MED ORDER — LACTATED RINGERS IV BOLUS
1000.0000 mL | Freq: Once | INTRAVENOUS | Status: AC
  Administered 2021-12-19: 1000 mL via INTRAVENOUS

## 2021-12-19 MED ORDER — METHYLPREDNISOLONE SODIUM SUCC 125 MG IJ SOLR: 60.0000 mg | Freq: Two times a day (BID) | INTRAMUSCULAR | Status: DC

## 2021-12-19 MED ORDER — ACETAMINOPHEN 325 MG PO TABS
650.0000 mg | ORAL_TABLET | Freq: Once | ORAL | Status: AC
  Administered 2021-12-19: 650 mg via ORAL
  Filled 2021-12-19: qty 2

## 2021-12-19 MED ORDER — ACETAMINOPHEN 650 MG RE SUPP: 650.0000 mg | Freq: Four times a day (QID) | RECTAL | Status: DC | PRN

## 2021-12-19 MED ORDER — FENTANYL CITRATE PF 50 MCG/ML IJ SOSY: 12.5000 ug | PREFILLED_SYRINGE | INTRAMUSCULAR | Status: DC | PRN

## 2021-12-19 MED ORDER — ENOXAPARIN SODIUM 30 MG/0.3ML IJ SOSY
30.0000 mg | PREFILLED_SYRINGE | INTRAMUSCULAR | Status: DC
  Administered 2021-12-19: 30 mg via SUBCUTANEOUS
  Filled 2021-12-19: qty 0.3

## 2021-12-19 MED ORDER — AMLODIPINE BESYLATE 10 MG PO TABS
10.0000 mg | ORAL_TABLET | Freq: Every day | ORAL | Status: DC
  Administered 2021-12-19 – 2021-12-23 (×5): 10 mg via ORAL
  Filled 2021-12-19 (×2): qty 1
  Filled 2021-12-19: qty 2
  Filled 2021-12-19 (×2): qty 1

## 2021-12-19 MED ORDER — VANCOMYCIN HCL 1500 MG/300ML IV SOLN: 1500.0000 mg | INTRAVENOUS | Status: DC

## 2021-12-19 MED ORDER — ALBUTEROL SULFATE (2.5 MG/3ML) 0.083% IN NEBU: 2.5000 mg | INHALATION_SOLUTION | RESPIRATORY_TRACT | Status: DC | PRN

## 2021-12-19 NOTE — ED Notes (Signed)
Patient in ct at this time. Antibiotics to be started when patient returns.

## 2021-12-19 NOTE — ED Provider Triage Note (Signed)
Emergency Medicine Provider Triage Evaluation Note  Tony Dean , a 61 y.o. male  was evaluated in triage.  Pt arrives via EMS reportedly from doctor's office.  Per EMS patient went to doctor's office for leg pain, when they arrived to EMS reports that based on the patient's smell they were concerned he had an infection and called EMS.  Patient reports he has had pain in his right upper leg for about 3 days, history of some chronic pain in this leg due to an injury 2 years ago.  He reports some swelling but has not noticed redness or skin changes.  He also reports an occasional cough and shortness of breath as well as some occasional vomiting and diarrhea.  Patient is a poor historian and unable to provide additional history.  Patient unaware that he had a fever.  Review of Systems  Positive: Fever, chills, diarrhea, vomiting, cough, leg pain Negative: Chest pain, abdominal pain  Physical Exam  BP (!) 155/106 (BP Location: Right Arm)   Pulse (!) 116   Temp (!) 101.1 F (38.4 C) (Oral)   Resp (!) 22   SpO2 96%  Gen:   Awake, poor historian but not in acute distress Resp:  Normal effort, lungs clear MSK:   Moves extremities without difficulty, some tenderness over the right upper thigh, exam limited in triage Other:  No abdominal tenderness  Medical Decision Making  Medically screening exam initiated at 10:58 AM.  Appropriate orders placed.  Aizen Duval was informed that the remainder of the evaluation will be completed by another provider, this initial triage assessment does not replace that evaluation, and the importance of remaining in the ED until their evaluation is complete.  Patient meets sepsis criteria, orders placed and notified charge nurse that patient will need acute bed for further evaluation   Dartha Lodge, PA-C 12/19/21 1115

## 2021-12-19 NOTE — Progress Notes (Signed)
Pharmacy Antibiotic Note  Tony Dean is a 61 y.o. male admitted on 12/19/2021 with sepsis.  Pharmacy has been consulted for vancomycin and cefepime dosing. Patient presented from Dr office with possible infection. Concern for septic arthritis with joint culture pending. Patient's renal function is at his approximate baseline of Scr 2.0-2.5. He is febrile up to 101.1 and his WBC is 12.3. Blood, urine, and joint cultures have been ordered.   Plan: Start cefepime 2g Q12H.  Patient loaded with IV vancomycin 1500mg  (20mg /kg) x1 in the ED, start 1500mg  Q48H eAUC: 511.9.  Monitor culture data for de-escalation.   Height: 5\' 10"  (177.8 cm) Weight: 90.3 kg (199 lb) IBW/kg (Calculated) : 73  Temp (24hrs), Avg:100.7 F (38.2 C), Min:100.3 F (37.9 C), Max:101.1 F (38.4 C)  Recent Labs  Lab 12/19/21 1139 12/19/21 1155  WBC 12.3*  --   CREATININE 2.75* 2.40*  LATICACIDVEN 2.8*  --     Estimated Creatinine Clearance: 37 mL/min (A) (by C-G formula based on SCr of 2.4 mg/dL (H)).    Allergies  Allergen Reactions   Lisinopril Cough    Thank you for allowing pharmacy to be a part of this patient's care.  12/19/2021 1:47 PM

## 2021-12-19 NOTE — Progress Notes (Signed)
Lower extremity venous right study completed.  Preliminary results relayed to Durwin Nora, MD at bedside.  See CV Proc for preliminary results report.   Jean Rosenthal, RDMS, RVT

## 2021-12-19 NOTE — Sepsis Progress Note (Signed)
Code Sepsis protocol being monitored by eLink. 

## 2021-12-19 NOTE — ED Provider Notes (Signed)
[facility] EMERGENCY DEPARTMENT Provider Note   CSN: [id number] Arrival date & time: 12/19/21  1041     History  Chief Complaint  Patient presents with   Leg Pain   GI Bleeding   Alcohol Problem    Tony Dean is a 61 y.o. male.   Leg Pain Associated symptoms: fatigue   Alcohol Problem Associated symptoms include headaches.  Patient presents for multiple complaints.  Medical history includes HTN, T2DM, alcoholism, gout, neuropathy.  For the past 3 days, he has had right leg pain.  This pain extends from hip down to his foot.  It is most prominent in the area of upper leg.  Pain is worsened with weightbearing.  He has had difficulty walking.  He has new swelling to this leg.  Patient has had exertional shortness of breath.  He has had headache pain which she attributes to his leg pain.  He denies any other recent symptoms.  Patient typically drinks every night.  He states that he drinks 3 beers and some brandy.  His last drink was Friday.  He has not drank since then due to the leg pain.     Home Medications Prior to Admission medications   Medication Sig Start Date End Date Taking? Authorizing Provider  amLODipine (NORVASC) 10 MG tablet Take 1 tablet (10 mg total) by mouth daily. 10/31/21  Yes Grayce Sessions, NP  atorvastatin (LIPITOR) 10 MG tablet Take 1 tablet (10 mg total) by mouth daily. 09/14/21  Yes Grayce Sessions, NP  carvedilol (COREG) 6.25 MG tablet Take 1 tablet (6.25 mg total) by mouth 2 (two) times daily with a meal. 10/31/21  Yes Grayce Sessions, NP  empagliflozin (JARDIANCE) 10 MG TABS tablet Take 1 tablet (10 mg total) by mouth daily before breakfast. 06/01/21  Yes Grayce Sessions, NP  losartan (COZAAR) 25 MG tablet Take 1 tablet (25 mg total) by mouth daily. 10/31/21  Yes Grayce Sessions, NP  meloxicam (MOBIC) 7.5 MG tablet Take 1 tablet (7.5 mg total) by mouth daily as needed for pain. 06/08/21  Yes Vivi Barrack, DPM   metFORMIN (GLUCOPHAGE XR) 500 MG 24 hr tablet Take 1 tablet (500 mg total) by mouth daily with breakfast. 11/09/20  Yes Grayce Sessions, NP  naproxen (NAPROSYN) 500 MG tablet Take 500 mg by mouth 2 (two) times daily. 05/23/21  Yes [provider]      Allergies    Lisinopril    Review of Systems   Review of Systems  Constitutional:  Positive for fatigue.  Cardiovascular:  Positive for leg swelling.  Gastrointestinal:  Positive for vomiting.  Musculoskeletal:  Positive for arthralgias and myalgias.  Neurological:  Positive for headaches.  All other systems reviewed and are negative.   Physical Exam Updated Vital Signs BP (!) 177/108   Pulse 95   Temp 98.8 F (37.1 C) (Oral)   Resp (!) 30   Ht 5\' 10"  (1.778 m)   Wt 90.3 kg   SpO2 94%   BMI 28.55 kg/m  Physical Exam Vitals and nursing note reviewed.  Constitutional:      General: He is not in acute distress.    Appearance: Normal appearance. He is well-developed and normal weight. He is ill-appearing and diaphoretic. He is not toxic-appearing.  HENT:     Head: Normocephalic and atraumatic.     Right Ear: External ear normal.     Left Ear: External ear normal.     Nose:  Nose normal.     Mouth/Throat:     Mouth: Mucous membranes are moist.     Pharynx: Oropharynx is clear.  Eyes:     Extraocular Movements: Extraocular movements intact.     Conjunctiva/sclera: Conjunctivae normal.  Cardiovascular:     Rate and Rhythm: Regular rhythm. Tachycardia present.     Heart sounds: No murmur heard. Pulmonary:     Effort: Pulmonary effort is normal. No respiratory distress.     Breath sounds: Normal breath sounds. No wheezing or rales.  Chest:     Chest wall: No tenderness.  Abdominal:     General: There is no distension.     Palpations: Abdomen is soft.     Tenderness: There is no abdominal tenderness.  Musculoskeletal:        General: Swelling and tenderness present.     Cervical back: Normal range of motion  and neck supple.     Right lower leg: Edema present.     Left lower leg: No edema.  Skin:    General: Skin is warm.     Capillary Refill: Capillary refill takes less than 2 seconds.     Coloration: Skin is not jaundiced or pale.  Neurological:     General: No focal deficit present.     Mental Status: He is alert and oriented to person, place, and time.     Cranial Nerves: No cranial nerve deficit.     Sensory: No sensory deficit.     Motor: No weakness.     Coordination: Coordination normal.  Psychiatric:        Mood and Affect: Mood normal.        Behavior: Behavior normal.        Thought Content: Thought content normal.        Judgment: Judgment normal.     ED Results / Procedures / Treatments   Labs (all labs ordered are listed, but only abnormal results are displayed) Labs Reviewed  LACTIC ACID, PLASMA - Abnormal; Notable for the following components:      Result Value   Lactic Acid, Venous 2.8 (*)    All other components within normal limits  COMPREHENSIVE METABOLIC PANEL - Abnormal; Notable for the following components:   CO2 21 (*)    Glucose, Bld 153 (*)    BUN 23 (*)    Creatinine, Ser 2.75 (*)    Total Protein 9.0 (*)    Albumin 3.1 (*)    Total Bilirubin 1.6 (*)    GFR, Estimated 26 (*)    Anion gap 16 (*)    All other components within normal limits  CBC WITH DIFFERENTIAL/PLATELET - Abnormal; Notable for the following components:   WBC 12.3 (*)    RBC 3.41 (*)    Hemoglobin 11.0 (*)    HCT 33.4 (*)    Neutro Abs 9.9 (*)    Monocytes Absolute 1.4 (*)    All other components within normal limits  URINALYSIS, ROUTINE W REFLEX MICROSCOPIC - Abnormal; Notable for the following components:   Color, Urine AMBER (*)    APPearance HAZY (*)    Glucose, UA 150 (*)    Hgb urine dipstick SMALL (*)    Ketones, ur 5 (*)    Protein, ur 100 (*)    All other components within normal limits  SEDIMENTATION RATE - Abnormal; Notable for the following components:   Sed  Rate 111 (*)    All other components within normal limits  C-REACTIVE  PROTEIN - Abnormal; Notable for the following components:   CRP 36.3 (*)    All other components within normal limits  SYNOVIAL CELL COUNT + DIFF, W/ CRYSTALS - Abnormal; Notable for the following components:   Appearance-Synovial CLOUDY (*)    WBC, Synovial 23,625 (*)    Neutrophil, Synovial 95 (*)    Monocyte-Macrophage-Synovial Fluid 4 (*)    All other components within normal limits  I-STAT CHEM 8, ED - Abnormal; Notable for the following components:   BUN 22 (*)    Creatinine, Ser 2.40 (*)    Glucose, Bld 153 (*)    Calcium, Ion 1.14 (*)    Hemoglobin 12.2 (*)    HCT 36.0 (*)    All other components within normal limits  RESP PANEL BY RT-PCR (FLU A&B, COVID) [id number]  GRAM STAIN  CULTURE, BLOOD (ROUTINE X 2)  CULTURE, BLOOD (ROUTINE X 2)  URINE CULTURE  BODY FLUID CULTURE W GRAM STAIN  LACTIC ACID, PLASMA  PROTIME-INR  APTT  ETHANOL  MAGNESIUM  GLUCOSE, BODY FLUID OTHER              EKG EKG Interpretation  Date/Time:  Tuesday December 19 2021 11:26:01 EDT Ventricular Rate:  112 PR Interval:  168 QRS Duration: 105 QT Interval:  345 QTC Calculation: 471 R Axis:   10 Text Interpretation: Sinus tachycardia Confirmed by Gloris Manchester ([id number]) on 12/19/2021 11:49:10 AM  Radiology VAS [place] LOWER EXTREMITY VENOUS (DVT) (ONLY [facility] & [facility])  Result Date: 12/19/2021  Lower Venous DVT Study Patient Name:  GAUGE WINSKI  Date of Exam:   12/19/2021 Medical Rec #: [id number]        Accession #:    [id number] Date of Birth: Mar 22, 1961       Patient Gender: M Patient Age:   66 years Exam Location:  [facility] Procedure:      VAS [place] LOWER EXTREMITY VENOUS (DVT) Referring Phys: Alycia Rossetti Domonic Kimball --------------------------------------------------------------------------------  Indications: Pain in right thigh and knee.  Comparison Study: No prior studies. Performing Technologist: Jean Rosenthal RDMS, RVT  Examination Guidelines: A  complete evaluation includes B-mode imaging, spectral Doppler, color Doppler, and power Doppler as needed of all accessible portions of each vessel. Bilateral testing is considered an integral part of a complete examination. Limited examinations for reoccurring indications may be performed as noted. The reflux portion of the exam is performed with the patient in reverse Trendelenburg.  +---------+---------------+---------+-----------+----------+--------------+ RIGHT    CompressibilityPhasicitySpontaneityPropertiesThrombus Aging +---------+---------------+---------+-----------+----------+--------------+ CFV      Full           Yes      Yes                                 +---------+---------------+---------+-----------+----------+--------------+ SFJ      Full                                                        +---------+---------------+---------+-----------+----------+--------------+ FV Prox  Full                                                        +---------+---------------+---------+-----------+----------+--------------+  FV Mid   Full                                                        +---------+---------------+---------+-----------+----------+--------------+ FV DistalFull                                                        +---------+---------------+---------+-----------+----------+--------------+ PFV      Full                                                        +---------+---------------+---------+-----------+----------+--------------+ POP      Full           Yes      Yes                                 +---------+---------------+---------+-----------+----------+--------------+ PTV      Full                                                        +---------+---------------+---------+-----------+----------+--------------+ PERO     Full                                                         +---------+---------------+---------+-----------+----------+--------------+ Gastroc  Full                                                        +---------+---------------+---------+-----------+----------+--------------+   +----+---------------+---------+-----------+----------+--------------+ LEFTCompressibilityPhasicitySpontaneityPropertiesThrombus Aging +----+---------------+---------+-----------+----------+--------------+ CFV Full           Yes      Yes                                 +----+---------------+---------+-----------+----------+--------------+     Summary: RIGHT: - There is no evidence of deep vein thrombosis in the lower extremity.  - No cystic structure found in the popliteal fossa.  LEFT: - No evidence of common femoral vein obstruction.  *See table(s) above for measurements and observations. Electronically signed by Waverly Ferrari MD on 12/19/2021 at 4:54:21 PM.    Final    CT FEMUR RIGHT WO CONTRAST  Result Date: 12/19/2021 CLINICAL DATA:  Soft tissue infection suspected, lower leg, x-ray done. EXAM: CT OF THE LOWER RIGHT EXTREMITY (RIGHT FEMUR, RIGHT TIBIA/FIBULA, AND RIGHT FOOT) WITHOUT CONTRAST TECHNIQUE: Multidetector CT imaging of the right lower extremity was performed according to the standard protocol. RADIATION  DOSE REDUCTION: This exam was performed according to the departmental dose-optimization program which includes automated exposure control, adjustment of the mA and/or kV according to patient size and/or use of iterative reconstruction technique. COMPARISON:  None Available. FINDINGS: Bones/Joint/Cartilage There is no evidence of acute fracture involving the right upper or lower leg. Alignment is normal. There is mild right hip osteoarthritis. Moderate degenerative changes of the symphysis pubis. There is tricompartment osteophyte formation of the right knee with severe lateral compartment joint space narrowing. There is moderate-sized right knee joint  effusion. Probable chronic posttraumatic deformities of the medial and lateral tibial plateaus. Bony fusion at the proximal tibiofibular joint. There is no evidence of acute fracture in the foot. Suspected chronic posttraumatic degenerative changes in the midfoot with moderate osteoarthritis and areas of bony fusion. Moderate first MTP osteoarthritis. Irregularity at the distal fifth metatarsal head/neck and distal fourth metatarsal head, likely posttraumatic. Large os navicularis/bony spur. Ligaments Suboptimally assessed by CT. Muscles and Tendons No acute myotendinous abnormality by CT. No intramuscular collection. Soft tissues No focal fluid collection. No suspicious soft tissue edema. No soft tissue gas. IMPRESSION: No acute osseous abnormality or suspicious soft tissue findings to suggest an infectious process in the right lower extremity. No focal fluid collection or soft tissue gas. Suspected chronic posttraumatic findings of the right knee and foot as described above, correlate with history. 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 Large right knee joint effusion which is nonspecific but probably related to internal derangement of the knee. Moderate midfoot and first MTP osteoarthritis. Electronically Signed   By: Caprice Renshaw M.D.   On: 12/19/2021 16:02   CT TIBIA FIBULA RIGHT WO CONTRAST  Result Date: 12/19/2021 CLINICAL DATA:  Soft tissue infection suspected, lower leg, x-ray done. EXAM: CT OF THE LOWER RIGHT EXTREMITY (RIGHT FEMUR, RIGHT TIBIA/FIBULA, AND RIGHT FOOT) WITHOUT CONTRAST TECHNIQUE: Multidetector CT imaging of the right lower extremity was performed according to the standard protocol. RADIATION DOSE REDUCTION: This exam was performed according to the departmental dose-optimization program which includes automated exposure control, adjustment of the mA and/or kV according to patient size and/or use of iterative reconstruction technique. COMPARISON:  None  Available. FINDINGS: Bones/Joint/Cartilage There is no evidence of acute fracture involving the right upper or lower leg. Alignment is normal. There is mild right hip osteoarthritis. Moderate degenerative changes of the symphysis pubis. There is tricompartment osteophyte formation of the right knee with severe lateral compartment joint space narrowing. There is moderate-sized right knee joint effusion. Probable chronic posttraumatic deformities of the medial and lateral tibial plateaus. Bony fusion at the proximal tibiofibular joint. There is no evidence of acute fracture in the foot. Suspected chronic posttraumatic degenerative changes in the midfoot with moderate osteoarthritis and areas of bony fusion. Moderate first MTP osteoarthritis. Irregularity at the distal fifth metatarsal head/neck and distal fourth metatarsal head, likely posttraumatic. Large os navicularis/bony spur. Ligaments Suboptimally assessed by CT. Muscles and Tendons No acute myotendinous abnormality by CT. No intramuscular collection. Soft tissues No focal fluid collection. No suspicious soft tissue edema. No soft tissue gas. IMPRESSION: No acute osseous abnormality or suspicious soft tissue findings to suggest an infectious process in the right lower extremity. No focal fluid collection or soft tissue gas. Suspected chronic posttraumatic findings of the right knee and foot as described above, correlate with history. 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 Large right knee joint effusion which is nonspecific but probably related to internal derangement of the knee. Moderate midfoot  and first MTP osteoarthritis. Electronically Signed   By: Caprice Renshaw M.D.   On: 12/19/2021 16:02   CT FOOT RIGHT WO CONTRAST  Result Date: 12/19/2021 CLINICAL DATA:  Soft tissue infection suspected, lower leg, x-ray done. EXAM: CT OF THE LOWER RIGHT EXTREMITY (RIGHT FEMUR, RIGHT TIBIA/FIBULA, AND RIGHT FOOT) WITHOUT CONTRAST  TECHNIQUE: Multidetector CT imaging of the right lower extremity was performed according to the standard protocol. RADIATION DOSE REDUCTION: This exam was performed according to the departmental dose-optimization program which includes automated exposure control, adjustment of the mA and/or kV according to patient size and/or use of iterative reconstruction technique. COMPARISON:  None Available. FINDINGS: Bones/Joint/Cartilage There is no evidence of acute fracture involving the right upper or lower leg. Alignment is normal. There is mild right hip osteoarthritis. Moderate degenerative changes of the symphysis pubis. There is tricompartment osteophyte formation of the right knee with severe lateral compartment joint space narrowing. There is moderate-sized right knee joint effusion. Probable chronic posttraumatic deformities of the medial and lateral tibial plateaus. Bony fusion at the proximal tibiofibular joint. There is no evidence of acute fracture in the foot. Suspected chronic posttraumatic degenerative changes in the midfoot with moderate osteoarthritis and areas of bony fusion. Moderate first MTP osteoarthritis. Irregularity at the distal fifth metatarsal head/neck and distal fourth metatarsal head, likely posttraumatic. Large os navicularis/bony spur. Ligaments Suboptimally assessed by CT. Muscles and Tendons No acute myotendinous abnormality by CT. No intramuscular collection. Soft tissues No focal fluid collection. No suspicious soft tissue edema. No soft tissue gas. IMPRESSION: No acute osseous abnormality or suspicious soft tissue findings to suggest an infectious process in the right lower extremity. No focal fluid collection or soft tissue gas. Suspected chronic posttraumatic findings of the right knee and foot as described above, correlate with history. 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 Large right knee joint effusion which is nonspecific but probably  related to internal derangement of the knee. Moderate midfoot and first MTP osteoarthritis. Electronically Signed   By: Caprice Renshaw M.D.   On: 12/19/2021 16:02   CT ABDOMEN PELVIS WO CONTRAST  Result Date: 12/19/2021 CLINICAL DATA:  Sepsis EXAM: CT ABDOMEN AND PELVIS WITHOUT CONTRAST TECHNIQUE: Multidetector CT imaging of the abdomen and pelvis was performed following the standard protocol without IV contrast. RADIATION DOSE REDUCTION: This exam was performed according to the departmental dose-optimization program which includes automated exposure control, adjustment of the mA and/or kV according to patient size and/or use of iterative reconstruction technique. COMPARISON:  11/01/2020 FINDINGS: Lower chest: Visualized lower lung fields are clear. There is ectasia of the ascending thoracic aorta measuring 4.7 cm. Hepatobiliary: No focal abnormalities are seen in liver. There is no dilation of bile ducts. Gallbladder is unremarkable. Pancreas: Pancreas appears smaller than usual in size. No focal abnormalities are seen. Spleen: Unremarkable. Adrenals/Urinary Tract: Adrenals are unremarkable. Stomach/Bowel: Stomach is unremarkable. Small bowel loops are not dilated. Appendix is not seen. There is no pericecal inflammation. There is no significant wall thickening in colon. There is no pericolic stranding. Vascular/Lymphatic: Scattered arterial calcifications are seen. Reproductive: Unremarkable. Other: There is no ascites or pneumoperitoneum. Umbilical hernia containing fat is seen. Bilateral inguinal hernias containing fat are seen. Musculoskeletal: Unremarkable. IMPRESSION: There is no evidence of intestinal obstruction or pneumoperitoneum. There is no hydronephrosis. There is aneurysmal dilation of ascending thoracic aorta measuring 4.7 cm. Ascending thoracic aortic aneurysm. Recommend semi-annual imaging followup by CTA or MRA and referral to cardiothoracic surgery if not already obtained. This recommendation  follows 2010 ACCF/AHA/AATS/ACR/ASA/SCA/SCAI/SIR/STS/SVM Guidelines for the Diagnosis and Management of Patients With Thoracic Aortic Disease. Circulation. 2010; 121: O277-A128. Aortic aneurysm NOS (ICD10-I71.9) Other findings as described in the body of the report. Electronically Signed   By: Ernie Avena M.D.   On: 12/19/2021 15:46   DG Knee 2 Views Right  Result Date: 12/19/2021 CLINICAL DATA:  RIGHT knee pain EXAM: RIGHT KNEE - 1-2 VIEW COMPARISON:  None Available. FINDINGS: Osseous alignment is normal. No fracture line or displaced fracture fragment is seen. No acute-appearing cortical irregularity or osseous lesion. Tricompartmental DJD, mild to moderate in degree with associated joint space narrowings and mild osseous spurring. Joint effusion within the suprapatellar bursa. Surrounding superficial soft tissues are unremarkable. IMPRESSION: 1. Tricompartmental DJD, mild to moderate in degree. 2. Joint effusion. 3. No acute-appearing osseous abnormality. Electronically Signed   By: Bary Richard M.D.   On: 12/19/2021 12:03   DG Chest Port 1 View  Result Date: 12/19/2021 CLINICAL DATA:  Questionable sepsis. EXAM: PORTABLE CHEST 1 VIEW COMPARISON:  Chest x-ray dated 11/09/2013. FINDINGS: Heart size and mediastinal contours are stable. Lungs are clear. No pleural effusion or pneumothorax is seen. Osseous structures about the chest are unremarkable. IMPRESSION: No active disease. No evidence of pneumonia or pulmonary edema. Electronically Signed   By: Bary Richard M.D.   On: 12/19/2021 12:02    Procedures .Joint Aspiration/Arthrocentesis  Date/Time: 12/19/2021 1:42 PM  Performed by: Gloris Manchester, MD Authorized by: Gloris Manchester, MD   Consent:    Consent obtained:  Verbal   Consent given by:  Patient   Risks, benefits, and alternatives were discussed: yes     Risks discussed:  Bleeding, infection and pain   Alternatives discussed:  No treatment and delayed treatment Universal protocol:     Procedure explained and questions answered to patient or proxy's satisfaction: yes     Imaging studies available: yes     Site/side marked: yes     Patient identity confirmed:  Verbally with patient Location:    Location:  Knee   Knee:  R knee Anesthesia:    Anesthesia method:  Local infiltration   Local anesthetic:  Lidocaine 2% WITH epi Procedure details:    Preparation: Patient was prepped and draped in usual sterile fashion     Needle gauge:  18 G   Ultrasound guidance: no     Approach:  Lateral   Aspirate amount:  50cc   Aspirate characteristics:  Yellow   Steroid injected: no     Specimen collected: yes   Post-procedure details:    Dressing:  Adhesive bandage   Procedure completion:  Tolerated well, no immediate complications     Medications Ordered in ED Medications  lactated ringers infusion ( Intravenous New Bag/Given 12/19/21 1323)  ceFEPIme (MAXIPIME) 2 g in sodium chloride 0.9 % 100 mL IVPB (has no administration in time range)  vancomycin (VANCOREADY) IVPB 1500 mg/300 mL (has no administration in time range)  fentaNYL (SUBLIMAZE) injection 100 mcg (has no administration in time range)  chlordiazePOXIDE (LIBRIUM) capsule 25 mg (has no administration in time range)  lactated ringers bolus 1,000 mL (0 mLs Intravenous Stopped 12/19/21 1427)  acetaminophen (TYLENOL) tablet 650 mg (650 mg Oral Given 12/19/21 1154)  fentaNYL (SUBLIMAZE) injection 100 mcg (100 mcg Intravenous Given 12/19/21 1153)  lactated ringers bolus 1,000 mL (0 mLs Intravenous Stopped 12/19/21 1423)    And  lactated ringers bolus 1,000 mL (0 mLs Intravenous Stopped 12/19/21 1515)  ceFEPIme (MAXIPIME) 2 g in  sodium chloride 0.9 % 100 mL IVPB (0 g Intravenous Stopped 12/19/21 1346)  metroNIDAZOLE (FLAGYL) IVPB 500 mg (0 mg Intravenous Stopped 12/19/21 1423)  vancomycin (VANCOREADY) IVPB 1750 mg/350 mL (1,750 mg Intravenous New Bag/Given 12/19/21 1424)  lidocaine-EPINEPHrine (XYLOCAINE W/EPI) 2 %-1:200000 (PF)  injection 10 mL (10 mLs Intradermal Given 12/19/21 1326)    ED Course/ Medical Decision Making/ A&P                           Medical Decision Making Amount and/or Complexity of Data Reviewed Labs: ordered. Radiology: ordered.  Risk OTC drugs. Prescription drug management. Decision regarding hospitalization.   This patient presents to the ED for concern of right leg pain, this involves an extensive number of treatment options, and is a complaint that carries with it a high risk of complications and morbidity.  The differential diagnosis includes sepsis, septic arthritis, gout, alcohol withdrawal, occult injury, soft tissue infection   Co morbidities that complicate the patient evaluation  HTN, T2DM, alcoholism, gout, neuropathy.  For the past 3 days, he has had right leg pain   Additional history obtained:  Additional history obtained from N/A External records from outside source obtained and reviewed including EMR   Lab Tests:  I Ordered, and personally interpreted labs.  The pertinent results include: Leukocytosis and lactic acidosis consistent with gout.  Inflammatory markers are elevated.  Patient has baseline CKD with normal electrolytes.  There is no evidence of UTI on urinalysis.  Synovial fluid analysis is consistent with gout.   Imaging Studies ordered:  I ordered imaging studies including chest x-ray, CT of abdomen and pelvis, CT of right lower extremity I independently visualized and interpreted imaging which showed no acute findings to identify source of infection I agree with the radiologist interpretation   Cardiac Monitoring: / EKG:  The patient was maintained on a cardiac monitor.  I personally viewed and interpreted the cardiac monitored which showed an underlying rhythm of: Sinus rhythm  Problem List / ED Course / Critical interventions / Medication management  Patient is a 61 year old male presenting for right leg pain.  Vital signs on arrival are  notable for fever, tachycardia, and tachypnea.  Septic work-up was initiated.  Patient given bolus of IV fluids.  On exam, he describes pain throughout his right lower extremity.  He does have associated swelling.  He has tenderness in the area of knee and distal thigh.  Hip range of motion is intact.  Knee range of motion is limited by pain.  Physical exam is otherwise unremarkable.  Patient has a soft nontender abdomen.  Lungs are clear to auscultation.  He does appear mildly diaphoretic which is likely attributable to his fever.  Tylenol was given for antipyresis.  Patient was given fentanyl for analgesia.  Laboratory work-up showed leukocytosis and lactic acidosis, consistent with sepsis.  Broad-spectrum antibiotics were initiated.  Chest x-ray showed no evidence of pneumonia.  Knee x-ray showed effusion only.  Given absence of source at this time, patient underwent CT imaging of abdomen and pelvis in addition to CT imaging of his right lower extremity.  He also underwent arthrocentesis of his right knee.  CT imaging did not identify source of infection.  Synovial fluid analysis is consistent with gout.  Patient had improvement in his tachycardia following IV fluids.  Lactic acidosis resolved.  He did endorse continued pain and additional fentanyl was ordered.  Patient was admitted to medicine for sepsis of unknown  source. I ordered medication including IV fluids and broad-spectrum antibiotics for abscess; Tylenol for antipyresis; fentanyl for analgesia Reevaluation of the patient after these medicines showed that the patient improved I have reviewed the patients home medicines and have made adjustments as needed   Social Determinants of Health:  History of alcohol abuse  CRITICAL CARE Performed by: Gloris Manchester   Total critical care time: 35 minutes  Critical care time was exclusive of separately billable procedures and treating other patients.  Critical care was necessary to treat or prevent  imminent or life-threatening deterioration.  Critical care was time spent personally by me on the following activities: development of treatment plan with patient and/or surrogate as well as nursing, discussions with consultants, evaluation of patient's response to treatment, examination of patient, obtaining history from patient or surrogate, ordering and performing treatments and interventions, ordering and review of laboratory studies, ordering and review of radiographic studies, pulse oximetry and re-evaluation of patient's condition.        Final Clinical Impression(s) / ED Diagnoses Final diagnoses:  Sepsis, due to unspecified organism, unspecified whether acute organ dysfunction present [facility])  Acute gout of right knee, unspecified cause    Rx / DC Orders ED Discharge Orders     None         Gloris Manchester, MD 12/19/21 1724

## 2021-12-19 NOTE — ED Notes (Signed)
[place] sandwich and water given to patient per dietary orders.

## 2021-12-19 NOTE — H&P (Signed)
History and Physical    DOA: 12/19/2021  PCP: Kerin Perna, NP  Patient coming from: Home  Chief Complaint: Right lower extremity pain  HPI: Tony Dean is a 61 y.o. male with history h/o hypertension, diabetes mellitus, gout who has been noncompliant with his medications presents with complaints of pain along the entire right lower extremity.  He states he noted swelling of the right knee about a week back and it started hurting about 3 days prior.  He also developed pain along the right hip and thigh which felt like muscle spasms for him.  He states he has had gouty flares involving his toes, fingers, knee joints and ankles in the past.  He states he was prescribed allopurinol in the past but he has not taken his medications in the last week as he ran out of them.  He does report drinking alcohol heavily-1 pint of brandy and about 120 ounces of beer per day.  He states he felt a little bit shaky earlier but was also noted to be febrile in the ED.  He denies any prior hospitalizations for withdrawals or seizures. ED course: Patient febrile up to 101.103F, tachycardic with heart rate up to 116, respiratory rate 22-30, blood pressure elevated up to 177/108.  Labs returned with WBC 12.3 K, hemoglobin 11, hematocrit 33.4,Sodium 136, potassium 3.6, BUN 23, creatinine 2.75 -> 2.4 (baseline), BG 153, LA 2.8-> 1.5.  Patient met sepsis criteria and initially started on sepsis protocol with IV fluid bolus, broad-spectrum antibiotics-IV cefepime/vancomycin/Flagyl patient did undergo extensive CT imaging studies including CT abdomen/pelvis, CT right femur//tibia which were all unremarkable for infectious source.  Subsequently EDP performed arthrocentesis of right knee joint and synovial fluid analysis pending.  Patient also started on low-dose Librium for possible withdrawal related tachycardia, hypertension.  Requested admission for further evaluation and management.    Review of Systems: As per HPI,  otherwise review of systems negative.    Past Medical History:  Diagnosis Date   Abnormal electrocardiogram (ECG) (EKG)    Dehydration    Dizziness    DM (diabetes mellitus) (HCC)    ED (erectile dysfunction)    Gout    Hypertension    Lymphangitis of groin    Scrotal pain    Sprain of left wrist    Urinary retention     Past Surgical History:  Procedure Laterality Date   APPENDECTOMY     INGUINAL HERNIA REPAIR  1990's   right    Social history:  reports that he has never smoked. He has never used smokeless tobacco. He reports current alcohol use of about 22.0 - 24.0 standard drinks of alcohol per week. He reports that he does not use drugs.   Allergies  Allergen Reactions   Lisinopril Cough    Family History  Problem Relation Age of Onset   Healthy Mother    Hypertension Father    Colon cancer Neg Hx       Prior to Admission medications   Medication Sig Start Date End Date Taking? Authorizing Provider  amLODipine (NORVASC) 10 MG tablet Take 1 tablet (10 mg total) by mouth daily. 10/31/21  Yes Kerin Perna, NP  atorvastatin (LIPITOR) 10 MG tablet Take 1 tablet (10 mg total) by mouth daily. 09/14/21  Yes Kerin Perna, NP  carvedilol (COREG) 6.25 MG tablet Take 1 tablet (6.25 mg total) by mouth 2 (two) times daily with a meal. 10/31/21  Yes Kerin Perna, NP  empagliflozin (JARDIANCE) 10  MG TABS tablet Take 1 tablet (10 mg total) by mouth daily before breakfast. 06/01/21  Yes Grayce Sessions, NP  losartan (COZAAR) 25 MG tablet Take 1 tablet (25 mg total) by mouth daily. 10/31/21  Yes Grayce Sessions, NP  meloxicam (MOBIC) 7.5 MG tablet Take 1 tablet (7.5 mg total) by mouth daily as needed for pain. 06/08/21  Yes Vivi Barrack, DPM  metFORMIN (GLUCOPHAGE XR) 500 MG 24 hr tablet Take 1 tablet (500 mg total) by mouth daily with breakfast. 11/09/20  Yes Grayce Sessions, NP  naproxen (NAPROSYN) 500 MG tablet Take 500 mg by mouth 2 (two) times  daily. 05/23/21  Yes [provider]    Physical Exam: Vitals:   12/19/21 1630 12/19/21 1730 12/19/21 1801 12/19/21 1830  BP: (!) 177/108 (!) 164/104 (!) 170/108 (!) 147/102  Pulse: 95 99 (!) 114 (!) 111  Resp: (!) 30 20 18  (!) 22  Temp:   99.8 F (37.7 C)   TempSrc:   Oral   SpO2: 94% 100% 92% 94%  Weight:      Height:        Constitutional: NAD, calm, comfortable Eyes: PERRL, lids and conjunctivae normal ENMT: Mucous membranes are moist. Posterior pharynx clear of any exudate or lesions.Normal dentition.  Neck: normal, supple, no masses, no thyromegaly Respiratory: clear to auscultation bilaterally, no wheezing, no crackles. Normal respiratory effort. No accessory muscle use.  Cardiovascular: Regular rate and rhythm, no murmurs / rubs / gallops. No extremity edema. 2+ pedal pulses. No carotid bruits.  Abdomen: no tenderness, no masses palpated. No hepatosplenomegaly. Bowel sounds positive.  Musculoskeletal: Decreased range of motion -that is flexion of bilateral hip joints right greater than left and decreased range of motion along right knee joint.  Also noted right knee joint swelling with warmth and tenderness on movement.  Small dressing get arthrocentesis site with no bleeding.  No clubbing / cyanosis.  Normal muscle tone.  Neurologic: CN 2-12 grossly intact. Sensation intact, DTR normal. Strength 5/5 in all 4.  No tremors or asterixis Psychiatric: Normal judgment and insight. Alert and oriented x 3. Normal mood.  SKIN/catheters: no rashes, lesions, ulcers. No induration  Labs on Admission: I have personally reviewed following labs and imaging studies  CBC: Recent Labs  Lab 12/19/21 1139 12/19/21 1155  WBC 12.3*  --   NEUTROABS 9.9*  --   HGB 11.0* 12.2*  HCT 33.4* 36.0*  MCV 97.9  --   PLT 250  --    Basic Metabolic Panel: Recent Labs  Lab 12/19/21 1139 12/19/21 1155  NA 136 138  K 3.6 3.6  CL 99 100  CO2 21*  --   GLUCOSE 153* 153*  BUN 23* 22*   CREATININE 2.75* 2.40*  CALCIUM 9.6  --    GFR: Estimated Creatinine Clearance: 37 mL/min (A) (by C-G formula based on SCr of 2.4 mg/dL (H)). Recent Labs  Lab 12/19/21 1139 12/19/21 1534  WBC 12.3*  --   LATICACIDVEN 2.8* 1.5   Liver Function Tests: Recent Labs  Lab 12/19/21 1139  AST 17  ALT 13  ALKPHOS 71  BILITOT 1.6*  PROT 9.0*  ALBUMIN 3.1*   No results for input(s): "LIPASE", "AMYLASE" in the last 168 hours. No results for input(s): "AMMONIA" in the last 168 hours. Coagulation Profile: Recent Labs  Lab 12/19/21 1139  INR 1.1   Cardiac Enzymes: No results for input(s): "CKTOTAL", "CKMB", "CKMBINDEX", "TROPONINI" in the last 168 hours. BNP (last 3 results)  No results for input(s): "PROBNP" in the last 8760 hours. HbA1C: No results for input(s): "HGBA1C" in the last 72 hours. CBG: No results for input(s): "GLUCAP" in the last 168 hours. Lipid Profile: No results for input(s): "CHOL", "HDL", "LDLCALC", "TRIG", "CHOLHDL", "LDLDIRECT" in the last 72 hours. Thyroid Function Tests: No results for input(s): "TSH", "T4TOTAL", "FREET4", "T3FREE", "THYROIDAB" in the last 72 hours. Anemia Panel: No results for input(s): "VITAMINB12", "FOLATE", "FERRITIN", "TIBC", "IRON", "RETICCTPCT" in the last 72 hours. Urine analysis:    Component Value Date/Time   COLORURINE AMBER (A) 12/19/2021 1104   APPEARANCEUR HAZY (A) 12/19/2021 1104   LABSPEC 1.020 12/19/2021 1104   PHURINE 5.0 12/19/2021 1104   GLUCOSEU 150 (A) 12/19/2021 1104   HGBUR SMALL (A) 12/19/2021 1104   BILIRUBINUR NEGATIVE 12/19/2021 1104   BILIRUBINUR neg 07/01/2017 1033   KETONESUR 5 (A) 12/19/2021 1104   PROTEINUR 100 (A) 12/19/2021 1104   UROBILINOGEN 1.0 10/31/2020 1903   NITRITE NEGATIVE 12/19/2021 1104   LEUKOCYTESUR NEGATIVE 12/19/2021 1104    Radiological Exams on Admission: Personally reviewed  VAS [place] LOWER EXTREMITY VENOUS (DVT) (ONLY [facility] & [facility])  Result Date: 12/19/2021  Lower Venous DVT  Study Patient Name:  KEIYON PLACK  Date of Exam:   12/19/2021 Medical Rec #: [id number]        Accession #:    [id number] Date of Birth: 05-03-1960       Patient Gender: M Patient Age:   6 years Exam Location:  [facility] Procedure:      VAS [place] LOWER EXTREMITY VENOUS (DVT) Referring Phys: Thurmond Butts DIXON --------------------------------------------------------------------------------  Indications: Pain in right thigh and knee.  Comparison Study: No prior studies. Performing Technologist: Darlin Coco RDMS, RVT  Examination Guidelines: A complete evaluation includes B-mode imaging, spectral Doppler, color Doppler, and power Doppler as needed of all accessible portions of each vessel. Bilateral testing is considered an integral part of a complete examination. Limited examinations for reoccurring indications may be performed as noted. The reflux portion of the exam is performed with the patient in reverse Trendelenburg.  +---------+---------------+---------+-----------+----------+--------------+ RIGHT    CompressibilityPhasicitySpontaneityPropertiesThrombus Aging +---------+---------------+---------+-----------+----------+--------------+ CFV      Full           Yes      Yes                                 +---------+---------------+---------+-----------+----------+--------------+ SFJ      Full                                                        +---------+---------------+---------+-----------+----------+--------------+ FV Prox  Full                                                        +---------+---------------+---------+-----------+----------+--------------+ FV Mid   Full                                                        +---------+---------------+---------+-----------+----------+--------------+  FV DistalFull                                                        +---------+---------------+---------+-----------+----------+--------------+ PFV      Full                                                         +---------+---------------+---------+-----------+----------+--------------+ POP      Full           Yes      Yes                                 +---------+---------------+---------+-----------+----------+--------------+ PTV      Full                                                        +---------+---------------+---------+-----------+----------+--------------+ PERO     Full                                                        +---------+---------------+---------+-----------+----------+--------------+ Gastroc  Full                                                        +---------+---------------+---------+-----------+----------+--------------+   +----+---------------+---------+-----------+----------+--------------+ LEFTCompressibilityPhasicitySpontaneityPropertiesThrombus Aging +----+---------------+---------+-----------+----------+--------------+ CFV Full           Yes      Yes                                 +----+---------------+---------+-----------+----------+--------------+     Summary: RIGHT: - There is no evidence of deep vein thrombosis in the lower extremity.  - No cystic structure found in the popliteal fossa.  LEFT: - No evidence of common femoral vein obstruction.  *See table(s) above for measurements and observations. Electronically signed by Deitra Mayo MD on 12/19/2021 at 4:54:21 PM.    Final    CT FEMUR RIGHT WO CONTRAST  Result Date: 12/19/2021 CLINICAL DATA:  Soft tissue infection suspected, lower leg, x-ray done. EXAM: CT OF THE LOWER RIGHT EXTREMITY (RIGHT FEMUR, RIGHT TIBIA/FIBULA, AND RIGHT FOOT) WITHOUT CONTRAST TECHNIQUE: Multidetector CT imaging of the right lower extremity was performed according to the standard protocol. RADIATION DOSE REDUCTION: This exam was performed according to the departmental dose-optimization program which includes automated exposure control, adjustment of  the mA and/or kV according to patient size and/or use of iterative reconstruction technique. COMPARISON:  None Available. FINDINGS: Bones/Joint/Cartilage There is no evidence of acute fracture involving the right upper or lower leg. Alignment is normal. There is mild right  hip osteoarthritis. Moderate degenerative changes of the symphysis pubis. There is tricompartment osteophyte formation of the right knee with severe lateral compartment joint space narrowing. There is moderate-sized right knee joint effusion. Probable chronic posttraumatic deformities of the medial and lateral tibial plateaus. Bony fusion at the proximal tibiofibular joint. There is no evidence of acute fracture in the foot. Suspected chronic posttraumatic degenerative changes in the midfoot with moderate osteoarthritis and areas of bony fusion. Moderate first MTP osteoarthritis. Irregularity at the distal fifth metatarsal head/neck and distal fourth metatarsal head, likely posttraumatic. Large os navicularis/bony spur. Ligaments Suboptimally assessed by CT. Muscles and Tendons No acute myotendinous abnormality by CT. No intramuscular collection. Soft tissues No focal fluid collection. No suspicious soft tissue edema. No soft tissue gas. IMPRESSION: No acute osseous abnormality or suspicious soft tissue findings to suggest an infectious process in the right lower extremity. No focal fluid collection or soft tissue gas. Suspected chronic posttraumatic findings of the right knee and foot as described above, correlate with history. 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 Large right knee joint effusion which is nonspecific but probably related to internal derangement of the knee. Moderate midfoot and first MTP osteoarthritis. Electronically Signed   By: Maurine Simmering M.D.   On: 12/19/2021 16:02   CT TIBIA FIBULA RIGHT WO CONTRAST  Result Date: 12/19/2021 CLINICAL DATA:  Soft tissue infection suspected, lower  leg, x-ray done. EXAM: CT OF THE LOWER RIGHT EXTREMITY (RIGHT FEMUR, RIGHT TIBIA/FIBULA, AND RIGHT FOOT) WITHOUT CONTRAST TECHNIQUE: Multidetector CT imaging of the right lower extremity was performed according to the standard protocol. RADIATION DOSE REDUCTION: This exam was performed according to the departmental dose-optimization program which includes automated exposure control, adjustment of the mA and/or kV according to patient size and/or use of iterative reconstruction technique. COMPARISON:  None Available. FINDINGS: Bones/Joint/Cartilage There is no evidence of acute fracture involving the right upper or lower leg. Alignment is normal. There is mild right hip osteoarthritis. Moderate degenerative changes of the symphysis pubis. There is tricompartment osteophyte formation of the right knee with severe lateral compartment joint space narrowing. There is moderate-sized right knee joint effusion. Probable chronic posttraumatic deformities of the medial and lateral tibial plateaus. Bony fusion at the proximal tibiofibular joint. There is no evidence of acute fracture in the foot. Suspected chronic posttraumatic degenerative changes in the midfoot with moderate osteoarthritis and areas of bony fusion. Moderate first MTP osteoarthritis. Irregularity at the distal fifth metatarsal head/neck and distal fourth metatarsal head, likely posttraumatic. Large os navicularis/bony spur. Ligaments Suboptimally assessed by CT. Muscles and Tendons No acute myotendinous abnormality by CT. No intramuscular collection. Soft tissues No focal fluid collection. No suspicious soft tissue edema. No soft tissue gas. IMPRESSION: No acute osseous abnormality or suspicious soft tissue findings to suggest an infectious process in the right lower extremity. No focal fluid collection or soft tissue gas. Suspected chronic posttraumatic findings of the right knee and foot as described above, correlate with history. 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 Large right knee joint effusion which is nonspecific but probably related to internal derangement of the knee. Moderate midfoot and first MTP osteoarthritis. Electronically Signed   By: Maurine Simmering M.D.   On: 12/19/2021 16:02   CT FOOT RIGHT WO CONTRAST  Result Date: 12/19/2021 CLINICAL DATA:  Soft tissue infection suspected, lower leg, x-ray done. EXAM: CT OF THE LOWER RIGHT EXTREMITY (RIGHT FEMUR, RIGHT TIBIA/FIBULA, AND RIGHT FOOT) WITHOUT CONTRAST TECHNIQUE: Multidetector CT imaging  of the right lower extremity was performed according to the standard protocol. RADIATION DOSE REDUCTION: This exam was performed according to the departmental dose-optimization program which includes automated exposure control, adjustment of the mA and/or kV according to patient size and/or use of iterative reconstruction technique. COMPARISON:  None Available. FINDINGS: Bones/Joint/Cartilage There is no evidence of acute fracture involving the right upper or lower leg. Alignment is normal. There is mild right hip osteoarthritis. Moderate degenerative changes of the symphysis pubis. There is tricompartment osteophyte formation of the right knee with severe lateral compartment joint space narrowing. There is moderate-sized right knee joint effusion. Probable chronic posttraumatic deformities of the medial and lateral tibial plateaus. Bony fusion at the proximal tibiofibular joint. There is no evidence of acute fracture in the foot. Suspected chronic posttraumatic degenerative changes in the midfoot with moderate osteoarthritis and areas of bony fusion. Moderate first MTP osteoarthritis. Irregularity at the distal fifth metatarsal head/neck and distal fourth metatarsal head, likely posttraumatic. Large os navicularis/bony spur. Ligaments Suboptimally assessed by CT. Muscles and Tendons No acute myotendinous abnormality by CT. No intramuscular collection. Soft tissues No  focal fluid collection. No suspicious soft tissue edema. No soft tissue gas. IMPRESSION: No acute osseous abnormality or suspicious soft tissue findings to suggest an infectious process in the right lower extremity. No focal fluid collection or soft tissue gas. Suspected chronic posttraumatic findings of the right knee and foot as described above, correlate with history. 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 Large right knee joint effusion which is nonspecific but probably related to internal derangement of the knee. Moderate midfoot and first MTP osteoarthritis. Electronically Signed   By: Maurine Simmering M.D.   On: 12/19/2021 16:02   CT ABDOMEN PELVIS WO CONTRAST  Result Date: 12/19/2021 CLINICAL DATA:  Sepsis EXAM: CT ABDOMEN AND PELVIS WITHOUT CONTRAST TECHNIQUE: Multidetector CT imaging of the abdomen and pelvis was performed following the standard protocol without IV contrast. RADIATION DOSE REDUCTION: This exam was performed according to the departmental dose-optimization program which includes automated exposure control, adjustment of the mA and/or kV according to patient size and/or use of iterative reconstruction technique. COMPARISON:  11/01/2020 FINDINGS: Lower chest: Visualized lower lung fields are clear. There is ectasia of the ascending thoracic aorta measuring 4.7 cm. Hepatobiliary: No focal abnormalities are seen in liver. There is no dilation of bile ducts. Gallbladder is unremarkable. Pancreas: Pancreas appears smaller than usual in size. No focal abnormalities are seen. Spleen: Unremarkable. Adrenals/Urinary Tract: Adrenals are unremarkable. Stomach/Bowel: Stomach is unremarkable. Small bowel loops are not dilated. Appendix is not seen. There is no pericecal inflammation. There is no significant wall thickening in colon. There is no pericolic stranding. Vascular/Lymphatic: Scattered arterial calcifications are seen. Reproductive: Unremarkable. Other: There  is no ascites or pneumoperitoneum. Umbilical hernia containing fat is seen. Bilateral inguinal hernias containing fat are seen. Musculoskeletal: Unremarkable. IMPRESSION: There is no evidence of intestinal obstruction or pneumoperitoneum. There is no hydronephrosis. There is aneurysmal dilation of ascending thoracic aorta measuring 4.7 cm. Ascending thoracic aortic aneurysm. Recommend semi-annual imaging followup by CTA or MRA and referral to cardiothoracic surgery if not already obtained. This recommendation follows 2010 ACCF/AHA/AATS/ACR/ASA/SCA/SCAI/SIR/STS/SVM Guidelines for the Diagnosis and Management of Patients With Thoracic Aortic Disease. Circulation. 2010; 121: Z009-Q330. Aortic aneurysm NOS (ICD10-I71.9) Other findings as described in the body of the report. Electronically Signed   By: Elmer Picker M.D.   On: 12/19/2021 15:46   DG Knee 2 Views Right  Result Date: 12/19/2021 CLINICAL DATA:  RIGHT knee pain EXAM: RIGHT KNEE - 1-2 VIEW COMPARISON:  None Available. FINDINGS: Osseous alignment is normal. No fracture line or displaced fracture fragment is seen. No acute-appearing cortical irregularity or osseous lesion. Tricompartmental DJD, mild to moderate in degree with associated joint space narrowings and mild osseous spurring. Joint effusion within the suprapatellar bursa. Surrounding superficial soft tissues are unremarkable. IMPRESSION: 1. Tricompartmental DJD, mild to moderate in degree. 2. Joint effusion. 3. No acute-appearing osseous abnormality. Electronically Signed   By: Franki Cabot M.D.   On: 12/19/2021 12:03   DG Chest Port 1 View  Result Date: 12/19/2021 CLINICAL DATA:  Questionable sepsis. EXAM: PORTABLE CHEST 1 VIEW COMPARISON:  Chest x-ray dated 11/09/2013. FINDINGS: Heart size and mediastinal contours are stable. Lungs are clear. No pleural effusion or pneumothorax is seen. Osseous structures about the chest are unremarkable. IMPRESSION: No active disease. No evidence of  pneumonia or pulmonary edema. Electronically Signed   By: Franki Cabot M.D.   On: 12/19/2021 12:02    EKG: Independently reviewed.  Normal sinus rhythm with sinus tachycardia at 111.  QTc 470 ms     Assessment and Plan:   Principal Problem:   Joint swelling Active Problems:   History of gout   Sepsis ([place])   Hypertension   Type 2 diabetes mellitus without complication, without long-term current use of insulin (HCC)   Non compliance w medication regimen   Alcoholism (HCC)   Neuropathy    1.  Right knee swelling/right leg pain with fever/elevated inflammatory markers: Sepsis suspected initially and treated accordingly.  However synovial fluid analysis shows cloudy appearance, WBC 23 K, 95% neutrophils and monosodium urate crystals consistent with gouty flare in the setting of noncompliance with medications.  Blood cultures already sent through ED.  Given WBC less than 50,000 and symptoms typical of his prior gouty flares, infection less likely.  Will treat with IV steroids and hold off on broad-spectrum antibiotics. Given CKD, will avoid NSAIDs.  Ordered colchicine low-dose and will need to be resumed on allopurinol after acute flare. Follow-up blood cultures and clinical progression, treat accordingly.  Initial lactic acidosis resolved with IV hydration.    2.  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  Sliding scale insulin while on steroids  3.  CKD stage IIIb: Creatinine appears to be overall at baseline around 2.8.  Improving with IV hydration.  Will reduce IV fluid rate given elevated blood pressure and infection less likely.  4.  Hypertension, sinus tachycardia: In the setting of medication noncompliance.  Resume home meds including beta blockers to avoid rebound tachycardia.  Could also be related to alcohol withdrawal.  Patient received Librium in the ED.  As needed hydralazine available.  5.  Heavy alcohol use: As described in HPI.   Will order CIWA protocol.  Received 25 mg Librium in the ED but does not have any signs of alcohol withdrawal on exam (evaluated just before he received p.o. Librium)    DVT prophylaxis: Lovenox-renal adjusted dose   Code Status: Full code as confirmed with patient   .Health care proxy would be his mother or Sister Per patient (lives with mother and son)  Patient/Family Communication: Discussed with patient and all questions answered to satisfaction.  Consults called: None Admission status :Patient will be admitted under OBSERVATION status.The patient's presenting symptoms, physical exam findings, and initial radiographic and laboratory data in the context of their medical condition is felt to place them at low risk for  further clinical deterioration. Furthermore, it is anticipated that the patient will be medically stable for discharge from the hospital within 2 midnights of hospital stay if improved significantly with IV Solu-Medrol.  If patient has persistent pain, ambulatory difficulty or hospital course complicated by alcohol withdrawal, can consider upgrading to inpatient status.     Guilford Shi MD [facility] Pager in [place]  If 7PM-7AM, please contact night-coverage [email or web address]   12/19/2021, 7:22 PM

## 2021-12-19 NOTE — ED Notes (Signed)
Admitting at bedside 

## 2021-12-19 NOTE — ED Triage Notes (Signed)
EMS stated, come from Dr's office for possible infection just the way he smells and has leg pain from fall 2 years ago.Marland Kitchen He also has an alcohol problem

## 2021-12-20 DIAGNOSIS — A419 Sepsis, unspecified organism: Secondary | ICD-10-CM | POA: Diagnosis present

## 2021-12-20 DIAGNOSIS — Z8249 Family history of ischemic heart disease and other diseases of the circulatory system: Secondary | ICD-10-CM | POA: Diagnosis not present

## 2021-12-20 DIAGNOSIS — M25561 Pain in right knee: Secondary | ICD-10-CM | POA: Diagnosis present

## 2021-12-20 DIAGNOSIS — E872 Acidosis, unspecified: Secondary | ICD-10-CM | POA: Diagnosis present

## 2021-12-20 DIAGNOSIS — E1165 Type 2 diabetes mellitus with hyperglycemia: Secondary | ICD-10-CM | POA: Diagnosis present

## 2021-12-20 DIAGNOSIS — M2540 Effusion, unspecified joint: Secondary | ICD-10-CM | POA: Diagnosis not present

## 2021-12-20 DIAGNOSIS — R6510 Systemic inflammatory response syndrome (SIRS) of non-infectious origin without acute organ dysfunction: Secondary | ICD-10-CM | POA: Diagnosis present

## 2021-12-20 DIAGNOSIS — Z79899 Other long term (current) drug therapy: Secondary | ICD-10-CM | POA: Diagnosis not present

## 2021-12-20 DIAGNOSIS — N1832 Chronic kidney disease, stage 3b: Secondary | ICD-10-CM | POA: Diagnosis present

## 2021-12-20 DIAGNOSIS — N179 Acute kidney failure, unspecified: Secondary | ICD-10-CM | POA: Diagnosis present

## 2021-12-20 DIAGNOSIS — E1140 Type 2 diabetes mellitus with diabetic neuropathy, unspecified: Secondary | ICD-10-CM | POA: Diagnosis present

## 2021-12-20 DIAGNOSIS — Z20822 Contact with and (suspected) exposure to covid-19: Secondary | ICD-10-CM | POA: Diagnosis present

## 2021-12-20 DIAGNOSIS — Z888 Allergy status to other drugs, medicaments and biological substances status: Secondary | ICD-10-CM | POA: Diagnosis not present

## 2021-12-20 DIAGNOSIS — I129 Hypertensive chronic kidney disease with stage 1 through stage 4 chronic kidney disease, or unspecified chronic kidney disease: Secondary | ICD-10-CM | POA: Diagnosis present

## 2021-12-20 DIAGNOSIS — Z7984 Long term (current) use of oral hypoglycemic drugs: Secondary | ICD-10-CM | POA: Diagnosis not present

## 2021-12-20 DIAGNOSIS — E1122 Type 2 diabetes mellitus with diabetic chronic kidney disease: Secondary | ICD-10-CM | POA: Diagnosis present

## 2021-12-20 DIAGNOSIS — M10061 Idiopathic gout, right knee: Secondary | ICD-10-CM | POA: Diagnosis present

## 2021-12-20 DIAGNOSIS — Z91148 Patient's other noncompliance with medication regimen for other reason: Secondary | ICD-10-CM | POA: Diagnosis not present

## 2021-12-20 DIAGNOSIS — F1020 Alcohol dependence, uncomplicated: Secondary | ICD-10-CM | POA: Diagnosis present

## 2021-12-20 LAB — GLUCOSE, CAPILLARY
Glucose-Capillary: 227 mg/dL — ABNORMAL HIGH (ref 70–99)
Glucose-Capillary: 235 mg/dL — ABNORMAL HIGH (ref 70–99)
Glucose-Capillary: 215 mg/dL — ABNORMAL HIGH (ref 70–99)
Glucose-Capillary: 219 mg/dL — ABNORMAL HIGH (ref 70–99)

## 2021-12-20 LAB — BASIC METABOLIC PANEL
Anion gap: 13 (ref 5–15)
BUN: 20 mg/dL (ref 6–20)
CO2: 21 mmol/L — ABNORMAL LOW (ref 22–32)
Calcium: 9.1 mg/dL (ref 8.9–10.3)
Chloride: 100 mmol/L (ref 98–111)
Creatinine, Ser: 1.98 mg/dL — ABNORMAL HIGH (ref 0.61–1.24)
GFR, Estimated: 38 mL/min — ABNORMAL LOW (ref >60)
Glucose, Bld: 288 mg/dL — ABNORMAL HIGH (ref 70–99)
Potassium: 4 mmol/L (ref 3.5–5.1)
Sodium: 134 mmol/L — ABNORMAL LOW (ref 135–145)

## 2021-12-20 LAB — URINE CULTURE

## 2021-12-20 LAB — CBC
HCT: 32.1 % — ABNORMAL LOW (ref 39.0–52.0)
Hemoglobin: 10.9 g/dL — ABNORMAL LOW (ref 13.0–17.0)
MCH: 32.1 pg (ref 26.0–34.0)
MCHC: 34 g/dL (ref 30.0–36.0)
MCV: 94.4 fL (ref 80.0–100.0)
Platelets: 250 10*3/uL (ref 150–400)
RBC: 3.4 MIL/uL — ABNORMAL LOW (ref 4.22–5.81)
RDW: 13.7 % (ref 11.5–15.5)
WBC: 10.9 10*3/uL — ABNORMAL HIGH (ref 4.0–10.5)
nRBC: 0 % (ref 0.0–0.2)

## 2021-12-20 LAB — GLUCOSE, BODY FLUID OTHER: Glucose, Body Fluid Other: 72 mg/dL

## 2021-12-20 LAB — HIV ANTIBODY (ROUTINE TESTING W REFLEX): HIV Screen 4th Generation wRfx: NONREACTIVE

## 2021-12-20 LAB — URIC ACID: Uric Acid, Serum: 7.5 mg/dL (ref 3.7–8.6)

## 2021-12-20 MED ORDER — ENOXAPARIN SODIUM 40 MG/0.4ML IJ SOSY
40.0000 mg | PREFILLED_SYRINGE | INTRAMUSCULAR | Status: DC
  Administered 2021-12-20 – 2021-12-22 (×3): 40 mg via SUBCUTANEOUS
  Filled 2021-12-20 (×3): qty 0.4

## 2021-12-20 NOTE — Progress Notes (Signed)
  Mobility Specialist Criteria Algorithm Info.   12/20/21 1540  Pain Assessment  Pain Assessment Faces  Faces Pain Scale 4  Pain Location LE's (joints)  Pain Descriptors / Indicators Discomfort;Tightness  Pain Intervention(s) Limited activity within patient's tolerance  Mobility  Activity Ambulated with assistance in hallway;Ambulated with assistance to bathroom (to recliner after ambulation)  Range of Motion/Exercises Active;All extremities  Level of Assistance Contact guard assist, steadying assist  Assistive Device Front wheel walker  RLE Weight Bearing WBAT  Distance Ambulated (ft) 560 ft  Activity Response Tolerated well   Patient received in supine eager and motivated to participate in mobility. Ambulated min guard with slow step-to gait. Limited secondary to pain and joint stiffness in RLE. Pain more so significant in R-ankle but tolerable to bearing weight. Returned to room without complaint or incident. Was left in recliner chair with all needs met, call bell in reach.    Tony Dean, [place], [place]  VFMBB:[phone] Office: [phone]

## 2021-12-20 NOTE — Progress Notes (Signed)
PROGRESS NOTE    Tony Dean  [id number]:[id number] DOB: January 01, 1961 DOA: 12/19/2021 PCP: Grayce Sessions, NP     Brief Narrative:   hypertension, diabetes mellitus, gout who has been noncompliant with his medications presents with complaints of pain along the entire right lower extremity Works in school as a custodian   Subjective:  Able to bend knee a little bit more, but not able to get up  Assessment & Plan:  Principal Problem:   Joint swelling Active Problems:   History of gout   Sepsis (HCC)   Hypertension   Type 2 diabetes mellitus without complication, without long-term current use of insulin (HCC)   Non compliance w medication regimen   Alcoholism (HCC)   Neuropathy   Right knee pain  Gouty arthritis, continue to have significant pain, not able to bear weight on right  Culture negative  D/c abx Continue iv steroids, colcichine      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  Sliding scale insulin while on steroids   CKD stage IIIb: Creatinine appears to be overall at baseline around 2.8.  Improving with IV hydration.  Will reduce IV fluid rate given elevated blood pressure and infection less likely.    Hypertension, sinus tachycardia: In the setting of medication noncompliance.  Resume home meds including beta blockers to avoid rebound tachycardia.  Could also be related to alcohol withdrawal.  Patient received Librium in the ED.  As needed hydralazine available.    Heavy alcohol use: As described in HPI.  Will order CIWA protocol.  Received 25 mg Librium in the ED but does not have any signs of alcohol withdrawal on exam (evaluated just before he received p.o. Librium)       Skin Assessment: I have examined the patient's skin and I agree with the wound assessment as performed by the wound care RN as outlined below:     I have Reviewed nursing notes, Vitals, pain scores, I/o's, Lab results and  imaging results  since pt's last encounter, details please see discussion above  I ordered the following labs:  Unresulted Labs (From admission, onward)     Start     Ordered   12/26/21 0500  Creatinine, serum  (enoxaparin (LOVENOX)    CrCl < 30 ml/min)  Once,   R       Comments: while on enoxaparin therapy.    12/19/21 1904             DVT prophylaxis: enoxaparin (LOVENOX) injection 40 mg Start: 12/20/21 2200   Code Status:   Code Status: Full Code  Family Communication: none at bedside  Disposition:   Dispo: The patient is from: home              Anticipated d/c is to: home              Anticipated d/c date is: when able to bear weight   Antimicrobials:    Anti-infectives (From admission, onward)    Start     Dose/Rate Route Frequency Ordered Stop   12/21/21 1315  vancomycin (VANCOREADY) IVPB 1500 mg/300 mL  Status:  Discontinued        1,500 mg 150 mL/hr over 120 Minutes Intravenous Every 48 hours 12/19/21 1346 12/19/21 1904   12/20/21 0100  ceFEPIme (MAXIPIME) 2 g in sodium chloride 0.9 % 100 mL IVPB        2 g 200 mL/hr over 30 Minutes Intravenous Every 12 hours  12/19/21 1346     12/19/21 1315  vancomycin (VANCOREADY) IVPB 1750 mg/350 mL        1,750 mg 175 mL/hr over 120 Minutes Intravenous  Once 12/19/21 1304 12/19/21 1749   12/19/21 1300  ceFEPIme (MAXIPIME) 2 g in sodium chloride 0.9 % 100 mL IVPB        2 g 200 mL/hr over 30 Minutes Intravenous  Once 12/19/21 1251 12/19/21 1346   12/19/21 1300  metroNIDAZOLE (FLAGYL) IVPB 500 mg        500 mg 100 mL/hr over 60 Minutes Intravenous  Once 12/19/21 1251 12/19/21 1423   12/19/21 1300  vancomycin (VANCOCIN) IVPB 1000 mg/200 mL premix  Status:  Discontinued        1,000 mg 200 mL/hr over 60 Minutes Intravenous  Once 12/19/21 1251 12/19/21 1304          Objective: Vitals:   12/20/21 0101 12/20/21 0517 12/20/21 0809 12/20/21 1426  BP: 127/89 (!) 126/100 (!) 128/99 117/78  Pulse: (!) 103 86 97 85  Resp: 20  17 17    Temp: 99 F (37.2 C)  (!) 97.4 F (36.3 C) 97.7 F (36.5 C)  TempSrc: Oral  Oral Oral  SpO2: 95% 98% 100% 98%  Weight:      Height:        Intake/Output Summary (Last 24 hours) at 12/20/2021 1927 Last data filed at 12/20/2021 1100 Gross per 24 hour  Intake 240 ml  Output 400 ml  Net -160 ml   Filed Weights   12/19/21 1123  Weight: 90.3 kg    Examination:  General exam: alert, awake, communicative,calm, NAD Respiratory system: Clear to auscultation. Respiratory effort normal. Cardiovascular system:  RRR.  Gastrointestinal system: Abdomen is nondistended, soft and nontender.  Normal bowel sounds heard. Central nervous system: Alert and oriented. No focal neurological deficits. Extremities:  right knee swollen, warm and tender to touch, limited range of motion due to pain Skin: scattered tophi finger, elbow and ankle Psychiatry: Judgement and insight appear normal. Mood & affect appropriate.     Data Reviewed: I have personally reviewed  labs and visualized  imaging studies since the last encounter and formulate the plan        Scheduled Meds:  amLODipine  10 mg Oral Daily   atorvastatin  10 mg Oral QHS   carvedilol  6.25 mg Oral BID WC   colchicine  0.3 mg Oral BID   empagliflozin  10 mg Oral QAC breakfast   enoxaparin (LOVENOX) injection  40 mg Subcutaneous Q24H   folic acid  1 mg Oral Daily   insulin aspart  0-9 Units Subcutaneous TID WC   losartan  25 mg Oral Daily   methylPREDNISolone (SOLU-MEDROL) injection  125 mg Intravenous Q24H   multivitamin with minerals  1 tablet Oral Daily   thiamine  100 mg Oral Daily   Or   thiamine  100 mg Intravenous Daily   Continuous Infusions:  ceFEPime (MAXIPIME) IV 2 g (12/20/21 0214)     LOS: 0 days     12/22/21, MD PhD FACP [facility]  Available via Epic secure chat 7am-7pm for nonurgent issues Please page for urgent issues To page the attending provider between 7A-7P or the covering provider during  after hours 7P-7A, please log into the web site [email or web address] and access using universal [place] password for that web site. If you do not have the password, please call the hospital operator.    12/20/2021, 7:27 PM

## 2021-12-20 NOTE — Plan of Care (Signed)

## 2021-12-21 DIAGNOSIS — M2540 Effusion, unspecified joint: Secondary | ICD-10-CM | POA: Diagnosis not present

## 2021-12-21 LAB — GLUCOSE, CAPILLARY
Glucose-Capillary: 222 mg/dL — ABNORMAL HIGH (ref 70–99)
Glucose-Capillary: 309 mg/dL — ABNORMAL HIGH (ref 70–99)
Glucose-Capillary: 246 mg/dL — ABNORMAL HIGH (ref 70–99)
Glucose-Capillary: 255 mg/dL — ABNORMAL HIGH (ref 70–99)

## 2021-12-21 MED ORDER — METHYLPREDNISOLONE SODIUM SUCC 125 MG IJ SOLR
60.0000 mg | INTRAMUSCULAR | Status: AC
  Administered 2021-12-21: 60 mg via INTRAVENOUS
  Filled 2021-12-21: qty 2

## 2021-12-21 MED ORDER — INSULIN GLARGINE-YFGN 100 UNIT/ML ~~LOC~~ SOLN
10.0000 [IU] | Freq: Every day | SUBCUTANEOUS | Status: DC
Start: 2021-12-22 — End: 2021-12-23
  Administered 2021-12-22 – 2021-12-23 (×2): 10 [IU] via SUBCUTANEOUS
  Filled 2021-12-21 (×2): qty 0.1

## 2021-12-21 MED ORDER — PREDNISONE 20 MG PO TABS
50.0000 mg | ORAL_TABLET | Freq: Every day | ORAL | Status: DC
  Administered 2021-12-22: 50 mg via ORAL
  Filled 2021-12-21: qty 1

## 2021-12-21 NOTE — Inpatient Diabetes Management (Signed)
Inpatient Diabetes Program Recommendations  AACE/ADA: New Consensus Statement on Inpatient Glycemic Control (2015)  Target Ranges:  Prepandial:   less than 140 mg/dL      Peak postprandial:   less than 180 mg/dL (1-2 hours)      Critically ill patients:  140 - 180 mg/dL   Lab Results  Component Value Date   GLUCAP 309 (H) 12/21/2021   HGBA1C 6.2 (A) 09/14/2021    Review of Glycemic Control  Latest Reference Range & Units 12/20/21 17:20 12/20/21 19:35 12/21/21 09:05 12/21/21 11:25  Glucose-Capillary 70 - 99 mg/dL 628 (H) 315 (H) 176 (H) 309 (H)  (H): Data is abnormally high Diabetes history: Type 2 DM Outpatient Diabetes medications: Jardiance 10 mg QD, Metformin 500 mg QD Current orders for Inpatient glycemic control: Novolog 0-9 units TID, Jardiance 10 mg QD Solumedrol 125 mg QD  Inpatient Diabetes Program Recommendations:    In the setting of steroids, consider: -Adding Semglee 14 units QD -Novolog 3 units TID (assuming patient is consuming >50% of meals).  Thanks, Lujean Rave, MSN, RNC-OB Diabetes Coordinator [phone] (8a-5p)

## 2021-12-21 NOTE — TOC Initial Note (Signed)
Transition of Care [facility]) - Initial/Assessment Note    Patient Details  Name: Tony Dean MRN: [id number] Date of Birth: 02-16-1961  Transition of Care [facility]) CM/SW Contact:    Durenda Guthrie, RN Phone Number: 12/21/2021, 11:21 AM  Clinical Narrative:                 Transition of Care Screening Note:  Transition of Care Department [facility]) has reviewed patient and no TOC needs have been identified at this time. We will continue to monitor patient advancement through Interdisciplinary progressions. If new patient transition needs arise, please place a consult.          Patient Goals and CMS Choice        Expected Discharge Plan and Services                                                Prior Living Arrangements/Services                       Activities of Daily Living Home Assistive Devices/Equipment: None ADL Screening (condition at time of admission) Patient's cognitive ability adequate to safely complete daily activities?: Yes Is the patient deaf or have difficulty hearing?: No Does the patient have difficulty seeing, even when wearing glasses/contacts?: No Does the patient have difficulty concentrating, remembering, or making decisions?: No Patient able to express need for assistance with ADLs?: Yes Does the patient have difficulty dressing or bathing?: No Independently performs ADLs?: Yes (appropriate for developmental age) Does the patient have difficulty walking or climbing stairs?: Yes Weakness of Legs: Both Weakness of Arms/Hands: None  Permission Sought/Granted                  Emotional Assessment              Admission diagnosis:  Sepsis (HCC) [A41.9] Acute gout of right knee, unspecified cause [M10.9] Sepsis, due to unspecified organism, unspecified whether acute organ dysfunction present (HCC) [A41.9] Right knee pain [M25.561] Patient Active Problem List   Diagnosis Date Noted   Right knee pain 12/20/2021    Sepsis (HCC) 12/19/2021   Joint swelling 12/19/2021   Hyperuricemia 05/12/2020   Hypertension 05/12/2020   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 [place] [address] Phone: [phone] Fax: [phone]     Social Determinants of Health (SDOH) Interventions    Readmission Risk Interventions     No data to display

## 2021-12-21 NOTE — Progress Notes (Signed)
PROGRESS NOTE    Tony Dean  [id number] DOB: [birth year] DOA: 12/19/2021 PCP: Kerin Perna, NP     Brief Narrative:   hypertension, diabetes mellitus, gout who has been noncompliant with his medications presents with complaints of pain along the entire right lower extremity Works in school as a custodian   Subjective:  Less pain , Able to bend knee a little bit more, but still not able to bare weight  Assessment & Plan:  Principal Problem:   Joint swelling Active Problems:   History of gout   Sepsis ([place])   Hypertension   Type 2 diabetes mellitus without complication, without long-term current use of insulin (HCC)   Non compliance w medication regimen   Alcoholism ([place])   Neuropathy   Right knee pain  Gouty arthritis, continue to have significant pain, not able to bear weight on right  Culture negative  D/c abx Improving, taper iv steroids, continue colcichine, restart allupurinol 1-2 days      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  Sliding scale insulin while on steroids   CKD stage IIIb: Creatinine appears to be overall at baseline around 2.8.  Improving with IV hydration.  Will reduce IV fluid rate given elevated blood pressure and infection less likely.    Hypertension, sinus tachycardia: In the setting of medication noncompliance.  Resume home meds including beta blockers to avoid rebound tachycardia.  Could also be related to alcohol withdrawal.  Patient received Librium in the ED.  As needed hydralazine available.    Heavy alcohol use: As described in HPI.  Will order CIWA protocol.  Received 25 mg Librium in the ED but does not have any signs of alcohol withdrawal on exam (evaluated just before he received p.o. Librium)       Skin Assessment: I have examined the patient's skin and I agree with the wound assessment as performed by the wound care RN as outlined below:     I have Reviewed nursing  notes, Vitals, pain scores, I/o's, Lab results and  imaging results since pt's last encounter, details please see discussion above  I ordered the following labs:  Unresulted Labs (From admission, onward)     Start     Ordered   12/26/21 0500  Creatinine, serum  (enoxaparin (LOVENOX)    CrCl < 30 ml/min)  Once,   R       Comments: while on enoxaparin therapy.    12/19/21 1904             DVT prophylaxis: enoxaparin (LOVENOX) injection 40 mg Start: 12/20/21 2200   Code Status:   Code Status: Full Code  Family Communication: none at bedside  Disposition:   Dispo: The patient is from: home              Anticipated d/c is to: home              Anticipated d/c date is: when able to bear weight , 24-48hrs  Antimicrobials:    Anti-infectives (From admission, onward)    Start     Dose/Rate Route Frequency Ordered Stop   12/21/21 1315  vancomycin (VANCOREADY) IVPB 1500 mg/300 mL  Status:  Discontinued        1,500 mg 150 mL/hr over 120 Minutes Intravenous Every 48 hours 12/19/21 1346 12/19/21 1904   12/20/21 0100  ceFEPIme (MAXIPIME) 2 g in sodium chloride 0.9 % 100 mL IVPB  2 g 200 mL/hr over 30 Minutes Intravenous Every 12 hours 12/19/21 1346     12/19/21 1315  vancomycin (VANCOREADY) IVPB 1750 mg/350 mL        1,750 mg 175 mL/hr over 120 Minutes Intravenous  Once 12/19/21 1304 12/19/21 1749   12/19/21 1300  ceFEPIme (MAXIPIME) 2 g in sodium chloride 0.9 % 100 mL IVPB        2 g 200 mL/hr over 30 Minutes Intravenous  Once 12/19/21 1251 12/19/21 1346   12/19/21 1300  metroNIDAZOLE (FLAGYL) IVPB 500 mg        500 mg 100 mL/hr over 60 Minutes Intravenous  Once 12/19/21 1251 12/19/21 1423   12/19/21 1300  vancomycin (VANCOCIN) IVPB 1000 mg/200 mL premix  Status:  Discontinued        1,000 mg 200 mL/hr over 60 Minutes Intravenous  Once 12/19/21 1251 12/19/21 1304          Objective: Vitals:   12/20/21 0809 12/20/21 1426 12/20/21 2120 12/21/21 0738  BP: (!)  128/99 117/78 (!) 121/101 (!) 137/103  Pulse: 97 85  87  Resp: 17 17    Temp: (!) 97.4 F (36.3 C) 97.7 F (36.5 C)  97.8 F (36.6 C)  TempSrc: Oral Oral Oral Oral  SpO2: 100% 98%  96%  Weight:      Height:        Intake/Output Summary (Last 24 hours) at 12/21/2021 1200 Last data filed at 12/21/2021 0400 Gross per 24 hour  Intake 440 ml  Output 900 ml  Net -460 ml   Filed Weights   12/19/21 1123  Weight: 90.3 kg    Examination:  General exam: alert, awake, communicative,calm, NAD Respiratory system: Clear to auscultation. Respiratory effort normal. Cardiovascular system:  RRR.  Gastrointestinal system: Abdomen is nondistended, soft and nontender.  Normal bowel sounds heard. Central nervous system: Alert and oriented. No focal neurological deficits. Extremities:  right knee swollen, warm and tender to touch, limited range of motion due to pain Skin: scattered tophi finger, elbow and ankle Psychiatry: Judgement and insight appear normal. Mood & affect appropriate.     Data Reviewed: I have personally reviewed  labs and visualized  imaging studies since the last encounter and formulate the plan        Scheduled Meds:  amLODipine  10 mg Oral Daily   atorvastatin  10 mg Oral QHS   carvedilol  6.25 mg Oral BID WC   colchicine  0.3 mg Oral BID   empagliflozin  10 mg Oral QAC breakfast   enoxaparin (LOVENOX) injection  40 mg Subcutaneous Q24H   folic acid  1 mg Oral Daily   insulin aspart  0-9 Units Subcutaneous TID WC   losartan  25 mg Oral Daily   methylPREDNISolone (SOLU-MEDROL) injection  125 mg Intravenous Q24H   multivitamin with minerals  1 tablet Oral Daily   thiamine  100 mg Oral Daily   Or   thiamine  100 mg Intravenous Daily   Continuous Infusions:  ceFEPime (MAXIPIME) IV 2 g (12/21/21 0111)     LOS: 1 day     Albertine Grates, MD PhD FACP [facility]  Available via Epic secure chat 7am-7pm for nonurgent issues Please page for urgent issues To  page the attending provider between 7A-7P or the covering provider during after hours 7P-7A, please log into the web site [email or web address] and access using universal [place] password for that web site. If you do not have the password, please  call the hospital operator.    12/21/2021, 12:00 PM

## 2021-12-21 NOTE — Plan of Care (Signed)
  Problem: Education: Goal: Ability to describe self-care measures that may prevent or decrease complications (Diabetes Survival Skills Education) will improve Outcome: Progressing Goal: Individualized Educational Video(s) Outcome: Progressing   Problem: Coping: Goal: Ability to adjust to condition or change in health will improve Outcome: Progressing   Problem: Fluid Volume: Goal: Ability to maintain a balanced intake and output will improve Outcome: Progressing   Problem: Health Behavior/Discharge Planning: Goal: Ability to identify and utilize available resources and services will improve Outcome: Progressing Goal: Ability to manage health-related needs will improve Outcome: Progressing   Problem: Metabolic: Goal: Ability to maintain appropriate glucose levels will improve Outcome: Progressing   Problem: Nutritional: Goal: Maintenance of adequate nutrition will improve Outcome: Progressing   Problem: Safety: Goal: Ability to remain free from injury will improve Outcome: Progressing

## 2021-12-21 NOTE — Plan of Care (Signed)
  Problem: Education: Goal: Ability to describe self-care measures that may prevent or decrease complications (Diabetes Survival Skills Education) will improve Outcome: Not Progressing Goal: Individualized Educational Video(s) Outcome: Not Progressing   Problem: Coping: Goal: Ability to adjust to condition or change in health will improve Outcome: Not Progressing   Problem: Fluid Volume: Goal: Ability to maintain a balanced intake and output will improve Outcome: Not Progressing   Problem: Health Behavior/Discharge Planning: Goal: Ability to identify and utilize available resources and services will improve Outcome: Not Progressing Goal: Ability to manage health-related needs will improve Outcome: Not Progressing   Problem: Nutritional: Goal: Maintenance of adequate nutrition will improve Outcome: Not Progressing Goal: Progress toward achieving an optimal weight will improve Outcome: Not Progressing   Problem: Skin Integrity: Goal: Risk for impaired skin integrity will decrease Outcome: Not Progressing   Problem: Education: Goal: Knowledge of General Education information will improve Description: Including pain rating scale, medication(s)/side effects and non-pharmacologic comfort measures Outcome: Not Progressing   Problem: Health Behavior/Discharge Planning: Goal: Ability to manage health-related needs will improve Outcome: Not Progressing   Problem: Clinical Measurements: Goal: Ability to maintain clinical measurements within normal limits will improve Outcome: Not Progressing Goal: Will remain free from infection Outcome: Not Progressing Goal: Diagnostic test results will improve Outcome: Not Progressing Goal: Respiratory complications will improve Outcome: Not Progressing Goal: Cardiovascular complication will be avoided Outcome: Not Progressing

## 2021-12-21 NOTE — Evaluation (Signed)
Physical Therapy Evaluation & Discharge Patient Details Name: Tony Dean MRN: [id number] DOB: August 20, 1960 Today's Date: 12/21/2021  History of Present Illness  61 y/o male presented to ED on 12/19/21 for possible infection in R leg. Found to have findings consistent with gout. PMH: HTN, T2DM  Clinical Impression  Patient admitted with the above. PTA, patient lives with mom and son and was independent. Patient complaining of R LE soreness during mobility but improves with increased time up. Patient functioning at min guard level with use of RW. Anticipate patient will progress quickly with further mobility. Will defer further mobility to nursing staff and mobility specialists. No further skilled PT needs identified acutely. No PT follow up recommended at this time. Patient will need RW at discharge.        Recommendations for follow up therapy are one component of a multi-disciplinary discharge planning process, led by the attending physician.  Recommendations may be updated based on patient status, additional functional criteria and insurance authorization.  Follow Up Recommendations No PT follow up      Assistance Recommended at Discharge PRN  Patient can return home with the following       Equipment Recommendations Rolling Travia Onstad (2 wheels)  Recommendations for Other Services       Functional Status Assessment Patient has had a recent decline in their functional status and demonstrates the ability to make significant improvements in function in a reasonable and predictable amount of time.     Precautions / Restrictions Precautions Precautions: Fall Restrictions Weight Bearing Restrictions: Yes RLE Weight Bearing: Weight bearing as tolerated      Mobility  Bed Mobility               General bed mobility comments: in recliner on arrival    Transfers Overall transfer level: Needs assistance Equipment used: Rolling Cullan Launer (2 wheels) Transfers: Sit to/from  Stand Sit to Stand: Min guard                Ambulation/Gait Ambulation/Gait assistance: Min guard Gait Distance (Feet): 500 Feet Assistive device: Rolling Pragya Lofaso (2 wheels) Gait Pattern/deviations: Step-through pattern, Decreased stride length, Decreased stance time - right Gait velocity: decreased     General Gait Details: very short step through gait pattern due to soreness in R LE  Stairs Stairs: Yes Stairs assistance: Min guard Stair Management: One rail Right, Alternating pattern, Forwards Number of Stairs: 3 General stair comments: min guard for safety.  Wheelchair Mobility    Modified Rankin (Stroke Patients Only)       Balance Overall balance assessment: Needs assistance Sitting-balance support: No upper extremity supported, Feet supported Sitting balance-Leahy Scale: Good     Standing balance support: Bilateral upper extremity supported, Reliant on assistive device for balance Standing balance-Leahy Scale: Poor Standing balance comment: reliant on RW for support                             Pertinent Vitals/Pain Pain Assessment Pain Assessment: Faces Faces Pain Scale: Hurts a little bit Pain Location: R LE Pain Descriptors / Indicators: Sore Pain Intervention(s): Monitored during session    Home Living Family/patient expects to be discharged to:: Private residence Living Arrangements: Children;Parent Available Help at Discharge: Family Type of Home: Apartment Home Access: Stairs to enter Entrance Stairs-Rails: Doctor, general practice of Steps: flight   Home Layout: One level Home Equipment: None      Prior Function Prior Level of Function : Independent/Modified  Independent;Working/employed             Mobility Comments: works as custodian with [employer]        Extremity/Trunk Assessment   Upper Extremity Assessment Upper Extremity Assessment: Overall WFL for tasks  assessed    Lower Extremity Assessment Lower Extremity Assessment: Generalized weakness    Cervical / Trunk Assessment Cervical / Trunk Assessment: Kyphotic  Communication   Communication: No difficulties  Cognition Arousal/Alertness: Awake/alert Behavior During Therapy: WFL for tasks assessed/performed Overall Cognitive Status: Within Functional Limits for tasks assessed                                          General Comments      Exercises     Assessment/Plan    PT Assessment Patient does not need any further PT services  PT Problem List         PT Treatment Interventions      PT Goals (Current goals can be found in the Care Plan section)  Acute Rehab PT Goals Patient Stated Goal: to reduce pain PT Goal Formulation: All assessment and education complete, DC therapy    Frequency       Co-evaluation               AM-PAC PT "6 Clicks" Mobility  Outcome Measure Help needed turning from your back to your side while in a flat bed without using bedrails?: A Little Help needed moving from lying on your back to sitting on the side of a flat bed without using bedrails?: A Little Help needed moving to and from a bed to a chair (including a wheelchair)?: A Little Help needed standing up from a chair using your arms (e.g., wheelchair or bedside chair)?: A Little Help needed to walk in hospital room?: A Little Help needed climbing 3-5 steps with a railing? : A Little 6 Click Score: 18    End of Session   Activity Tolerance: Patient tolerated treatment well Patient left: in chair;with call bell/phone within reach Nurse Communication: Mobility status PT Visit Diagnosis: Unsteadiness on feet (R26.81);Muscle weakness (generalized) (M62.81)    Time: 1601-0932 PT Time Calculation (min) (ACUTE ONLY): 17 min   Charges:   PT Evaluation $PT Eval Low Complexity: 1 Low          Cyris Maalouf A. Dan Humphreys PT, DPT Acute Rehabilitation Services Office  [phone]   Viviann Spare 12/21/2021, 3:53 PM

## 2021-12-22 ENCOUNTER — Other Ambulatory Visit (HOSPITAL_COMMUNITY): Payer: Self-pay

## 2021-12-22 ENCOUNTER — Telehealth (HOSPITAL_COMMUNITY): Payer: Self-pay | Admitting: Pharmacy Technician

## 2021-12-22 DIAGNOSIS — M2540 Effusion, unspecified joint: Secondary | ICD-10-CM | POA: Diagnosis not present

## 2021-12-22 LAB — BASIC METABOLIC PANEL
Anion gap: 9 (ref 5–15)
BUN: 43 mg/dL — ABNORMAL HIGH (ref 6–20)
CO2: 24 mmol/L (ref 22–32)
Calcium: 9 mg/dL (ref 8.9–10.3)
Chloride: 99 mmol/L (ref 98–111)
Creatinine, Ser: 2.06 mg/dL — ABNORMAL HIGH (ref 0.61–1.24)
GFR, Estimated: 36 mL/min — ABNORMAL LOW (ref >60)
Glucose, Bld: 302 mg/dL — ABNORMAL HIGH (ref 70–99)
Potassium: 4.5 mmol/L (ref 3.5–5.1)
Sodium: 132 mmol/L — ABNORMAL LOW (ref 135–145)

## 2021-12-22 LAB — CBC WITH DIFFERENTIAL/PLATELET
Abs Immature Granulocytes: 0.05 10*3/uL (ref 0.00–0.07)
Basophils Absolute: 0 10*3/uL (ref 0.0–0.1)
Basophils Relative: 0 %
Eosinophils Absolute: 0 10*3/uL (ref 0.0–0.5)
Eosinophils Relative: 0 %
HCT: 29.2 % — ABNORMAL LOW (ref 39.0–52.0)
Hemoglobin: 10.2 g/dL — ABNORMAL LOW (ref 13.0–17.0)
Immature Granulocytes: 1 %
Lymphocytes Relative: 5 %
Lymphs Abs: 0.6 10*3/uL — ABNORMAL LOW (ref 0.7–4.0)
MCH: 32.1 pg (ref 26.0–34.0)
MCHC: 34.9 g/dL (ref 30.0–36.0)
MCV: 91.8 fL (ref 80.0–100.0)
Monocytes Absolute: 0.3 10*3/uL (ref 0.1–1.0)
Monocytes Relative: 3 %
Neutro Abs: 10.1 10*3/uL — ABNORMAL HIGH (ref 1.7–7.7)
Neutrophils Relative %: 91 %
Platelets: 318 10*3/uL (ref 150–400)
RBC: 3.18 MIL/uL — ABNORMAL LOW (ref 4.22–5.81)
RDW: 13.4 % (ref 11.5–15.5)
WBC: 11 10*3/uL — ABNORMAL HIGH (ref 4.0–10.5)
nRBC: 0 % (ref 0.0–0.2)

## 2021-12-22 LAB — BODY FLUID CULTURE W GRAM STAIN
Culture: NO GROWTH
Gram Stain: NONE SEEN

## 2021-12-22 LAB — GLUCOSE, CAPILLARY
Glucose-Capillary: 278 mg/dL — ABNORMAL HIGH (ref 70–99)
Glucose-Capillary: 265 mg/dL — ABNORMAL HIGH (ref 70–99)
Glucose-Capillary: 195 mg/dL — ABNORMAL HIGH (ref 70–99)
Glucose-Capillary: 211 mg/dL — ABNORMAL HIGH (ref 70–99)

## 2021-12-22 MED ORDER — PREDNISONE 20 MG PO TABS
40.0000 mg | ORAL_TABLET | Freq: Every day | ORAL | Status: DC
  Administered 2021-12-23: 40 mg via ORAL
  Filled 2021-12-22: qty 2

## 2021-12-22 MED ORDER — ALLOPURINOL 100 MG PO TABS
100.0000 mg | ORAL_TABLET | Freq: Every day | ORAL | Status: DC
  Administered 2021-12-22 – 2021-12-23 (×2): 100 mg via ORAL
  Filled 2021-12-22 (×2): qty 1

## 2021-12-22 NOTE — TOC Benefit Eligibility Note (Signed)
Patient Product/process development scientist completed.    The patient is currently admitted and upon discharge could be taking Novolog Pen.  The current 30 day co-pay is $0.00.   The patient is currently admitted and upon discharge could be taking Humalog Pen.  Non Formulary  The patient is insured through [facility] [employer]     Tony Dean, CPhT Pharmacy Patient Advocate Specialist [facility] Patient Advocate Team Direct Number: [phone]  Fax: [phone]

## 2021-12-22 NOTE — Telephone Encounter (Signed)
Pharmacy Patient Advocate Encounter  Insurance verification completed.    The patient is insured through [employer]   The patient is currently admitted and ran test claims for the following: Novolog Pen, Humalog Pen.  Copays and coinsurance results were relayed to Inpatient clinical team.

## 2021-12-22 NOTE — Inpatient Diabetes Management (Signed)
Inpatient Diabetes Program Recommendations  AACE/ADA: New Consensus Statement on Inpatient Glycemic Control (2015)  Target Ranges:  Prepandial:   less than 140 mg/dL      Peak postprandial:   less than 180 mg/dL (1-2 hours)      Critically ill patients:  140 - 180 mg/dL   Lab Results  Component Value Date   GLUCAP 265 (H) 12/22/2021   HGBA1C 6.2 (A) 09/14/2021    Review of Glycemic Control  Latest Reference Range & Units 12/20/21 17:20 12/20/21 19:35 12/21/21 09:05 12/21/21 11:25  Glucose-Capillary 70 - 99 mg/dL 215 (H) 219 (H) 222 (H) 309 (H)   Diabetes history: Type 2 DM Outpatient Diabetes medications: Jardiance 10 mg QD, Metformin 500 mg QD Current orders for Inpatient glycemic control:  Semglee 10 units Daily Novolog 0-9 units TID Jardiance 10 mg QD PO prednisone 50 mg Daily  A1c 6.2% on 5/25  Spoke with pt at bedside regarding hyperglycemia in the hospital related to steroid use and insulin use at home. Discussed how to, when, and where to use SQ insulin injections, need for home glucose monitoring regularly, (before the meals), and symptoms of potential hypoglycemia are discussed.   After steroid dose is discontinued told pt to continue to monitor glucose trends on a regular basis.   Working with pharmacy on benefits check on short acting insulins at time of d/c.   Supplies needed at d/c: Glucose meter kit order # [id number] Insulin pen needles order # [id number] Tobie Lords order #[id number]  Thanks,  Tama Headings RN, MSN, BC-ADM Inpatient Diabetes Coordinator Team Pager [phone] (8a-5p)

## 2021-12-22 NOTE — Progress Notes (Signed)
PROGRESS NOTE    Tony Dean  DXI:[id number] DOB: 06-Sep-1960 DOA: 12/19/2021 PCP: Grayce Sessions, NP     Brief Narrative:   hypertension, diabetes mellitus, gout who has been noncompliant with his medications presents with complaints of pain along the entire right lower extremity Works in school as a custodian   Subjective:  Less pain , Able to bend knee a little bit more, able to bare weight Blood glucose elevated, he is not comfortable going home on insulin, cutting down steroids, but suspect he will at least need to go home on ssi, will asked diabetes RN to do teaching   Assessment & Plan:  Principal Problem:   Joint swelling Active Problems:   History of gout   Sepsis (HCC)   Hypertension   Type 2 diabetes mellitus without complication, without long-term current use of insulin (HCC)   Non compliance w medication regimen   Alcoholism (HCC)   Neuropathy   Right knee pain  Gouty arthritis, improving  Culture negative  Uric acid 7.5 , can be falsely low in the setting of acute attack, goal of urinc acid is less than 6, need to f/u with pcp to repeat uric acid level in 1-2 months improving, taper steroids, restart allupurinol       Type 2 diabetes mellitus with neuropathy: continue medication Jardiance.   Diabetes education: for insulin teaching, he is not comfortable using insulin , he has no meter, no testing strips, he never used insulin in the past, I hope hyperglycemia is just from steroids, but he might need to go home on ssi at least. plan for discharge tomorrow if he is comfortable using ssi.   CKD stage IIIb: Creatinine appears to be overall at baseline around 2.8.  Improving with IV hydration.  Will reduce IV fluid rate given elevated blood pressure and infection less likely.    Hypertension, sinus tachycardia:  Improved,   Resume home meds including beta blockers to avoid rebound tachycardia.  Could also be related to alcohol withdrawal.  Patient  received Librium in the ED.  As needed hydralazine available.    Heavy alcohol use:   Received 25 mg Librium in the ED but does not have any signs of alcohol withdrawal since admitted       I have Reviewed nursing notes, Vitals, pain scores, I/o's, Lab results and  imaging results since pt's last encounter, details please see discussion above  I ordered the following labs:  Unresulted Labs (From admission, onward)     Start     Ordered   12/26/21 0500  Creatinine, serum  (enoxaparin (LOVENOX)    CrCl < 30 ml/min)  Once,   R       Comments: while on enoxaparin therapy.    12/19/21 1904   12/23/21 0500  Basic metabolic panel  Tomorrow morning,   R       Question:  Specimen collection method  Answer:  Lab=Lab collect   12/22/21 1247   12/23/21 0500  Hemoglobin A1c  Tomorrow morning,   R       Question:  Specimen collection method  Answer:  Lab=Lab collect   12/22/21 1249             DVT prophylaxis: enoxaparin (LOVENOX) injection 40 mg Start: 12/20/21 2200   Code Status:   Code Status: Full Code  Family Communication: none at bedside  Disposition:   Dispo: The patient is from: home  Anticipated d/c is to: home              Anticipated d/c date is: if blood glucose is stable and comfortable doing ssi insulin at home  Antimicrobials:    Anti-infectives (From admission, onward)    Start     Dose/Rate Route Frequency Ordered Stop   12/21/21 1315  vancomycin (VANCOREADY) IVPB 1500 mg/300 mL  Status:  Discontinued        1,500 mg 150 mL/hr over 120 Minutes Intravenous Every 48 hours 12/19/21 1346 12/19/21 1904   12/20/21 0100  ceFEPIme (MAXIPIME) 2 g in sodium chloride 0.9 % 100 mL IVPB  Status:  Discontinued        2 g 200 mL/hr over 30 Minutes Intravenous Every 12 hours 12/19/21 1346 12/21/21 1446   12/19/21 1315  vancomycin (VANCOREADY) IVPB 1750 mg/350 mL        1,750 mg 175 mL/hr over 120 Minutes Intravenous  Once 12/19/21 1304 12/19/21 1749   12/19/21  1300  ceFEPIme (MAXIPIME) 2 g in sodium chloride 0.9 % 100 mL IVPB        2 g 200 mL/hr over 30 Minutes Intravenous  Once 12/19/21 1251 12/19/21 1346   12/19/21 1300  metroNIDAZOLE (FLAGYL) IVPB 500 mg        500 mg 100 mL/hr over 60 Minutes Intravenous  Once 12/19/21 1251 12/19/21 1423   12/19/21 1300  vancomycin (VANCOCIN) IVPB 1000 mg/200 mL premix  Status:  Discontinued        1,000 mg 200 mL/hr over 60 Minutes Intravenous  Once 12/19/21 1251 12/19/21 1304          Objective: Vitals:   12/21/21 1555 12/21/21 2156 12/22/21 0323 12/22/21 0753  BP: 127/89 (!) 131/97 (!) 132/95 (!) 141/91  Pulse: 81 64 67 67  Resp:  18 18 18   Temp: 98 F (36.7 C) (!) 97.5 F (36.4 C) (!) 97.5 F (36.4 C) (!) 97.5 F (36.4 C)  TempSrc: Oral Oral Oral Oral  SpO2: 99% 100% 97% 98%  Weight:      Height:        Intake/Output Summary (Last 24 hours) at 12/22/2021 1258 Last data filed at 12/22/2021 0953 Gross per 24 hour  Intake 360 ml  Output 2375 ml  Net -2015 ml   Filed Weights   12/19/21 1123  Weight: 90.3 kg    Examination:  General exam: alert, awake, communicative,calm, NAD Respiratory system: Clear to auscultation. Respiratory effort normal. Cardiovascular system:  RRR.  Gastrointestinal system: Abdomen is nondistended, soft and nontender.  Normal bowel sounds heard. Central nervous system: Alert and oriented. No focal neurological deficits. Extremities:  right knee less swollen, less tender, able to bend more but still not able to do full range of motion  Skin: scattered tophi finger, elbow and ankle Psychiatry: Judgement and insight appear normal. Mood & affect appropriate.     Data Reviewed: I have personally reviewed  labs and visualized  imaging studies since the last encounter and formulate the plan        Scheduled Meds:  allopurinol  100 mg Oral Daily   amLODipine  10 mg Oral Daily   atorvastatin  10 mg Oral QHS   carvedilol  6.25 mg Oral BID WC    empagliflozin  10 mg Oral QAC breakfast   enoxaparin (LOVENOX) injection  40 mg Subcutaneous Q24H   folic acid  1 mg Oral Daily   insulin aspart  0-9 Units Subcutaneous TID WC  insulin glargine-yfgn  10 Units Subcutaneous Daily   losartan  25 mg Oral Daily   multivitamin with minerals  1 tablet Oral Daily   predniSONE  50 mg Oral Q breakfast   thiamine  100 mg Oral Daily   Or   thiamine  100 mg Intravenous Daily   Continuous Infusions:     LOS: 2 days     Florencia Reasons, MD PhD FACP [facility]  Available via Epic secure chat 7am-7pm for nonurgent issues Please page for urgent issues To page the attending provider between 7A-7P or the covering provider during after hours 7P-7A, please log into the web site [email or web address] and access using universal  password for that web site. If you do not have the password, please call the hospital operator.    12/22/2021, 12:58 PM

## 2021-12-22 NOTE — Progress Notes (Signed)
Mobility Specialist Criteria Algorithm Info.   12/22/21 1219  Mobility  Activity Ambulated independently in hallway;Ambulated independently to bathroom (In recliner before and after)  Range of Motion/Exercises Active;All extremities  Level of Assistance Modified independent, requires aide device or extra time  Assistive Device None  RLE Weight Bearing WBAT  Distance Ambulated (ft) 550 ft  Activity Response Tolerated well   Patient received in recliner eager to participate in mobility. Ambulated in hallway and to bathroom independently with steady gait. Gait speed and ROM in RLE (ankle and knee) still limited by stiffness but reports pain as minimal. Returned to room without complaint or incident. Was left in recliner with all needs met, call bell in reach.   [place] Jonnathan Birman, [place], [place]  QGBEE:[phone] Office: [phone]

## 2021-12-23 DIAGNOSIS — M2540 Effusion, unspecified joint: Secondary | ICD-10-CM | POA: Diagnosis not present

## 2021-12-23 LAB — BASIC METABOLIC PANEL
Anion gap: 8 (ref 5–15)
BUN: 41 mg/dL — ABNORMAL HIGH (ref 6–20)
CO2: 27 mmol/L (ref 22–32)
Calcium: 9.3 mg/dL (ref 8.9–10.3)
Chloride: 100 mmol/L (ref 98–111)
Creatinine, Ser: 2.06 mg/dL — ABNORMAL HIGH (ref 0.61–1.24)
GFR, Estimated: 36 mL/min — ABNORMAL LOW (ref >60)
Glucose, Bld: 259 mg/dL — ABNORMAL HIGH (ref 70–99)
Potassium: 4 mmol/L (ref 3.5–5.1)
Sodium: 135 mmol/L (ref 135–145)

## 2021-12-23 LAB — GLUCOSE, CAPILLARY
Glucose-Capillary: 229 mg/dL — ABNORMAL HIGH (ref 70–99)
Glucose-Capillary: 217 mg/dL — ABNORMAL HIGH (ref 70–99)

## 2021-12-23 LAB — HEMOGLOBIN A1C
Hgb A1c MFr Bld: 6.7 % — ABNORMAL HIGH (ref 4.8–5.6)
Mean Plasma Glucose: 145.59 mg/dL

## 2021-12-23 LAB — MAGNESIUM: Magnesium: 2.1 mg/dL (ref 1.7–2.4)

## 2021-12-23 MED ORDER — ADULT MULTIVITAMIN W/MINERALS CH: 1.0000 | ORAL_TABLET | Freq: Every day | ORAL | 0 refills | Status: AC

## 2021-12-23 MED ORDER — FOLIC ACID 1 MG PO TABS: 1.0000 mg | ORAL_TABLET | Freq: Every day | ORAL | 1 refills | Status: AC

## 2021-12-23 MED ORDER — OXYCODONE HCL 5 MG PO TABS
5.0000 mg | ORAL_TABLET | ORAL | 0 refills | Status: DC | PRN
Start: 2021-12-23 — End: 2023-08-19

## 2021-12-23 MED ORDER — ALLOPURINOL 100 MG PO TABS: 100.0000 mg | ORAL_TABLET | Freq: Every day | ORAL | 1 refills | Status: DC

## 2021-12-23 MED ORDER — METFORMIN HCL ER 500 MG PO TB24: 500.0000 mg | ORAL_TABLET | Freq: Every day | ORAL | 0 refills | Status: DC

## 2021-12-23 MED ORDER — VITAMIN B-1 100 MG PO TABS: 100.0000 mg | ORAL_TABLET | Freq: Every day | ORAL | 0 refills | Status: AC

## 2021-12-23 NOTE — Progress Notes (Signed)
Mobility Specialist Criteria Algorithm Info.   12/23/21 1045  Mobility  Activity Ambulated with assistance in hallway  Range of Motion/Exercises Active;All extremities  Level of Assistance Independent  Assistive Device None  RLE Weight Bearing WBAT  Distance Ambulated (ft) 1100 ft  Activity Response Tolerated well   Patient received in recliner eager and motivated to participate in mobility. Ambulated independently  with steady gait. ROM in R-knee improved significantly but still has some stiffness in the R-ankle. Gait speed has increased compared to prior sessions and was able to extend distance. Tolerated without complaint or incident. Was left in recliner with all needs met, call bell in reach.   [place] Red Mandt, [place], [place]  WGYKZ:[phone] Office: [phone]

## 2021-12-23 NOTE — Discharge Summary (Addendum)
Physician Discharge Summary   Patient: Tony Dean MRN: [id number] DOB: 1960/10/15  Admit date:     12/19/2021  Discharge date: 12/23/21  Discharge Physician: Bonnell Public   PCP: Kerin Perna, NP   Recommendations at discharge:   Follow-up primary care provider within 1 week of discharge. Continue to monitor blood sugar closely on discharge.  Discharge Diagnoses: Principal Problem:   Acute gouty arthritis of the right knee Active Problems:   History of gout   SIRS due to non-infectious cause.   Hypertension   Type 2 diabetes mellitus without complication, without long-term current use of insulin (HCC)   Non compliance w medication regimen   Alcoholism ([place])   Neuropathy   Right knee pain   Hospital Course: Patient is a 61 year old male with past medical history significant for hypertension, diabetes mellitus and gout.  Patient was admitted with gouty arthritis involving the right knee.  Apparently, patient had been noncompliant with allopurinol.  On presentation to the emergency room, patient was noted to be febrile up to 101.38F, tachycardic with heart rate up to 116, respiratory rate 22-30, blood pressure elevated up to 177/108.  Labs returned with WBC 12.3 K, hemoglobin 11, hematocrit 33.4,Sodium 136, potassium 3.6, BUN 23, creatinine 2.75 -> 2.4 (baseline), BG 153, LA 2.8-> 1.5.  Patient met sepsis criteria and initially started on sepsis protocol with IV fluid bolus, broad-spectrum antibiotics-IV cefepime/vancomycin/Flagyl patient did undergo extensive CT imaging studies including CT abdomen/pelvis, CT right femur//tibia which were all unremarkable for infectious source.  Subsequently EDP performed arthrocentesis of right knee joint and synovial fluid analysis was suggestive of gout flare.  Patient was managed with steroids.  Symptoms have resolved significantly.  Patient be discharged back home to the care of the primary care provider.    Assessment and  Plan: Gouty arthritis: -Culture negative  -Uric acid 7.5 , can be falsely low in the setting of acute attack -Goal of urinc acid is less than 6, need to f/u with pcp to repeat uric acid level in 1-2 months improving, taper steroids.  Restart allopurinol once acute gout resolves completely.      Type 2 diabetes mellitus with neuropathy: -Continue medication Jardiance.   -Diabetes education: for insulin teaching, he is not comfortable using insulin , he has no meter, no testing strips, he never used insulin in the past, I hope hyperglycemia is just from steroids, but he might need to go home on ssi at least. plan for discharge tomorrow if he is comfortable using ssi.   CKD stage IIIb:  -Creatinine appears to be overall at baseline around 2.8.  Improving with IV hydration.  Will reduce IV fluid rate given elevated blood pressure and infection less likely.   Hypertension, sinus tachycardia:  Improved,   Resume home meds including beta blockers to avoid rebound tachycardia.  Could also be related to alcohol withdrawal.  Patient received Librium in the ED.  As needed hydralazine available.   Heavy alcohol use:    -Received 25 mg Librium in the ED but does not have any signs of alcohol withdrawal since admitted    SIRS due to non-infectious Cause: -Likely related to gouty arthritis;     Consultants: None Procedures performed: Arthrocentesis of the right knee Disposition: Home Diet recommendation:  Discharge Diet Orders (From admission, onward)     Start     Ordered   12/23/21 0000  Diet - low sodium heart healthy        12/23/21 1407  Carb modified diet DISCHARGE MEDICATION: Allergies as of 12/23/2021       Reactions   Lisinopril Cough        Medication List     STOP taking these medications    meloxicam 7.5 MG tablet Commonly known as: Mobic   naproxen 500 MG tablet Commonly known as: NAPROSYN       TAKE these medications    allopurinol 100 MG  tablet Commonly known as: ZYLOPRIM Take 1 tablet (100 mg total) by mouth daily. Start taking on: December 24, 2021   amLODipine 10 MG tablet Commonly known as: NORVASC Take 1 tablet (10 mg total) by mouth daily.   atorvastatin 10 MG tablet Commonly known as: LIPITOR Take 1 tablet (10 mg total) by mouth daily.   carvedilol 6.25 MG tablet Commonly known as: COREG Take 1 tablet (6.25 mg total) by mouth 2 (two) times daily with a meal.   empagliflozin 10 MG Tabs tablet Commonly known as: JARDIANCE Take 1 tablet (10 mg total) by mouth daily before breakfast.   folic acid 1 MG tablet Commonly known as: FOLVITE Take 1 tablet (1 mg total) by mouth daily. Start taking on: December 24, 2021   losartan 25 MG tablet Commonly known as: COZAAR Take 1 tablet (25 mg total) by mouth daily.   metFORMIN 500 MG 24 hr tablet Commonly known as: Glucophage XR Take 1 tablet (500 mg total) by mouth daily with breakfast.   multivitamin with minerals Tabs tablet Take 1 tablet by mouth daily. Start taking on: December 24, 2021   oxyCODONE 5 MG immediate release tablet Commonly known as: Oxy IR/ROXICODONE Take 1 tablet (5 mg total) by mouth every 4 (four) hours as needed for moderate pain.   thiamine 100 MG tablet Commonly known as: Vitamin B-1 Take 1 tablet (100 mg total) by mouth daily. Start taking on: December 24, 2021               Durable Medical Equipment  (From admission, onward)           Start     Ordered   12/21/21 2126  For home use only DME Walker rolling  Once       Question Answer Comment  Walker: With [place] Wheels   Patient needs a walker to treat with the following condition FTT (failure to thrive) in adult      12/21/21 2125   12/21/21 1555  For home use only DME Walker rolling  Once       Question Answer Comment  Walker: With [place]   Patient needs a walker to treat with the following condition Lower extremity pain      12/21/21 1555             Discharge Exam: Filed Weights   12/19/21 1123  Weight: 90.3 kg     Condition at discharge: stable  The results of significant diagnostics from this hospitalization (including imaging, microbiology, ancillary and laboratory) are listed below for reference.   Imaging Studies: VAS [place] LOWER EXTREMITY VENOUS (DVT) (ONLY [facility] & [facility])  Result Date: 12/19/2021  Lower Venous DVT Study Patient Name:  KENTO GOSSMAN  Date of Exam:   12/19/2021 Medical Rec #: [id number]        Accession #:    [id number] Date of Birth: 03-09-1961       Patient Gender: M Patient Age:   86 years Exam Location:  [facility] Procedure:  VAS [place] LOWER EXTREMITY VENOUS (DVT) Referring Phys: Thurmond Butts DIXON --------------------------------------------------------------------------------  Indications: Pain in right thigh and knee.  Comparison Study: No prior studies. Performing Technologist: Darlin Coco RDMS, RVT  Examination Guidelines: A complete evaluation includes B-mode imaging, spectral Doppler, color Doppler, and power Doppler as needed of all accessible portions of each vessel. Bilateral testing is considered an integral part of a complete examination. Limited examinations for reoccurring indications may be performed as noted. The reflux portion of the exam is performed with the patient in reverse Trendelenburg.  +---------+---------------+---------+-----------+----------+--------------+ RIGHT    CompressibilityPhasicitySpontaneityPropertiesThrombus Aging +---------+---------------+---------+-----------+----------+--------------+ CFV      Full           Yes      Yes                                 +---------+---------------+---------+-----------+----------+--------------+ SFJ      Full                                                        +---------+---------------+---------+-----------+----------+--------------+ FV Prox  Full                                                         +---------+---------------+---------+-----------+----------+--------------+ FV Mid   Full                                                        +---------+---------------+---------+-----------+----------+--------------+ FV DistalFull                                                        +---------+---------------+---------+-----------+----------+--------------+ PFV      Full                                                        +---------+---------------+---------+-----------+----------+--------------+ POP      Full           Yes      Yes                                 +---------+---------------+---------+-----------+----------+--------------+ PTV      Full                                                        +---------+---------------+---------+-----------+----------+--------------+ PERO     Full                                                        +---------+---------------+---------+-----------+----------+--------------+  Gastroc  Full                                                        +---------+---------------+---------+-----------+----------+--------------+   +----+---------------+---------+-----------+----------+--------------+ LEFTCompressibilityPhasicitySpontaneityPropertiesThrombus Aging +----+---------------+---------+-----------+----------+--------------+ CFV Full           Yes      Yes                                 +----+---------------+---------+-----------+----------+--------------+     Summary: RIGHT: - There is no evidence of deep vein thrombosis in the lower extremity.  - No cystic structure found in the popliteal fossa.  LEFT: - No evidence of common femoral vein obstruction.  *See table(s) above for measurements and observations. Electronically signed by Deitra Mayo MD on 12/19/2021 at 4:54:21 PM.    Final    CT FEMUR RIGHT WO CONTRAST  Result Date: 12/19/2021 CLINICAL DATA:  Soft tissue infection suspected,  lower leg, x-ray done. EXAM: CT OF THE LOWER RIGHT EXTREMITY (RIGHT FEMUR, RIGHT TIBIA/FIBULA, AND RIGHT FOOT) WITHOUT CONTRAST TECHNIQUE: Multidetector CT imaging of the right lower extremity was performed according to the standard protocol. RADIATION DOSE REDUCTION: This exam was performed according to the departmental dose-optimization program which includes automated exposure control, adjustment of the mA and/or kV according to patient size and/or use of iterative reconstruction technique. COMPARISON:  None Available. FINDINGS: Bones/Joint/Cartilage There is no evidence of acute fracture involving the right upper or lower leg. Alignment is normal. There is mild right hip osteoarthritis. Moderate degenerative changes of the symphysis pubis. There is tricompartment osteophyte formation of the right knee with severe lateral compartment joint space narrowing. There is moderate-sized right knee joint effusion. Probable chronic posttraumatic deformities of the medial and lateral tibial plateaus. Bony fusion at the proximal tibiofibular joint. There is no evidence of acute fracture in the foot. Suspected chronic posttraumatic degenerative changes in the midfoot with moderate osteoarthritis and areas of bony fusion. Moderate first MTP osteoarthritis. Irregularity at the distal fifth metatarsal head/neck and distal fourth metatarsal head, likely posttraumatic. Large os navicularis/bony spur. Ligaments Suboptimally assessed by CT. Muscles and Tendons No acute myotendinous abnormality by CT. No intramuscular collection. Soft tissues No focal fluid collection. No suspicious soft tissue edema. No soft tissue gas. IMPRESSION: No acute osseous abnormality or suspicious soft tissue findings to suggest an infectious process in the right lower extremity. No focal fluid collection or soft tissue gas. Suspected chronic posttraumatic findings of the right knee and foot as described above, correlate with history. 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 Large right knee joint effusion which is nonspecific but probably related to internal derangement of the knee. Moderate midfoot and first MTP osteoarthritis. Electronically Signed   By: Maurine Simmering M.D.   On: 12/19/2021 16:02   CT TIBIA FIBULA RIGHT WO CONTRAST  Result Date: 12/19/2021 CLINICAL DATA:  Soft tissue infection suspected, lower leg, x-ray done. EXAM: CT OF THE LOWER RIGHT EXTREMITY (RIGHT FEMUR, RIGHT TIBIA/FIBULA, AND RIGHT FOOT) WITHOUT CONTRAST TECHNIQUE: Multidetector CT imaging of the right lower extremity was performed according to the standard protocol. RADIATION DOSE REDUCTION: This exam was performed according to the departmental dose-optimization program which includes automated exposure control, adjustment of the mA and/or kV according to patient size  and/or use of iterative reconstruction technique. COMPARISON:  None Available. FINDINGS: Bones/Joint/Cartilage There is no evidence of acute fracture involving the right upper or lower leg. Alignment is normal. There is mild right hip osteoarthritis. Moderate degenerative changes of the symphysis pubis. There is tricompartment osteophyte formation of the right knee with severe lateral compartment joint space narrowing. There is moderate-sized right knee joint effusion. Probable chronic posttraumatic deformities of the medial and lateral tibial plateaus. Bony fusion at the proximal tibiofibular joint. There is no evidence of acute fracture in the foot. Suspected chronic posttraumatic degenerative changes in the midfoot with moderate osteoarthritis and areas of bony fusion. Moderate first MTP osteoarthritis. Irregularity at the distal fifth metatarsal head/neck and distal fourth metatarsal head, likely posttraumatic. Large os navicularis/bony spur. Ligaments Suboptimally assessed by CT. Muscles and Tendons No acute myotendinous abnormality by CT. No intramuscular collection. Soft  tissues No focal fluid collection. No suspicious soft tissue edema. No soft tissue gas. IMPRESSION: No acute osseous abnormality or suspicious soft tissue findings to suggest an infectious process in the right lower extremity. No focal fluid collection or soft tissue gas. Suspected chronic posttraumatic findings of the right knee and foot as described above, correlate with history. 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 Large right knee joint effusion which is nonspecific but probably related to internal derangement of the knee. Moderate midfoot and first MTP osteoarthritis. Electronically Signed   By: Maurine Simmering M.D.   On: 12/19/2021 16:02   CT FOOT RIGHT WO CONTRAST  Result Date: 12/19/2021 CLINICAL DATA:  Soft tissue infection suspected, lower leg, x-ray done. EXAM: CT OF THE LOWER RIGHT EXTREMITY (RIGHT FEMUR, RIGHT TIBIA/FIBULA, AND RIGHT FOOT) WITHOUT CONTRAST TECHNIQUE: Multidetector CT imaging of the right lower extremity was performed according to the standard protocol. RADIATION DOSE REDUCTION: This exam was performed according to the departmental dose-optimization program which includes automated exposure control, adjustment of the mA and/or kV according to patient size and/or use of iterative reconstruction technique. COMPARISON:  None Available. FINDINGS: Bones/Joint/Cartilage There is no evidence of acute fracture involving the right upper or lower leg. Alignment is normal. There is mild right hip osteoarthritis. Moderate degenerative changes of the symphysis pubis. There is tricompartment osteophyte formation of the right knee with severe lateral compartment joint space narrowing. There is moderate-sized right knee joint effusion. Probable chronic posttraumatic deformities of the medial and lateral tibial plateaus. Bony fusion at the proximal tibiofibular joint. There is no evidence of acute fracture in the foot. Suspected chronic posttraumatic degenerative  changes in the midfoot with moderate osteoarthritis and areas of bony fusion. Moderate first MTP osteoarthritis. Irregularity at the distal fifth metatarsal head/neck and distal fourth metatarsal head, likely posttraumatic. Large os navicularis/bony spur. Ligaments Suboptimally assessed by CT. Muscles and Tendons No acute myotendinous abnormality by CT. No intramuscular collection. Soft tissues No focal fluid collection. No suspicious soft tissue edema. No soft tissue gas. IMPRESSION: No acute osseous abnormality or suspicious soft tissue findings to suggest an infectious process in the right lower extremity. No focal fluid collection or soft tissue gas. Suspected chronic posttraumatic findings of the right knee and foot as described above, correlate with history. 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 Large right knee joint effusion which is nonspecific but probably related to internal derangement of the knee. Moderate midfoot and first MTP osteoarthritis. Electronically Signed   By: Maurine Simmering M.D.   On: 12/19/2021 16:02   CT ABDOMEN PELVIS WO CONTRAST  Result Date:  12/19/2021 CLINICAL DATA:  Sepsis EXAM: CT ABDOMEN AND PELVIS WITHOUT CONTRAST TECHNIQUE: Multidetector CT imaging of the abdomen and pelvis was performed following the standard protocol without IV contrast. RADIATION DOSE REDUCTION: This exam was performed according to the departmental dose-optimization program which includes automated exposure control, adjustment of the mA and/or kV according to patient size and/or use of iterative reconstruction technique. COMPARISON:  11/01/2020 FINDINGS: Lower chest: Visualized lower lung fields are clear. There is ectasia of the ascending thoracic aorta measuring 4.7 cm. Hepatobiliary: No focal abnormalities are seen in liver. There is no dilation of bile ducts. Gallbladder is unremarkable. Pancreas: Pancreas appears smaller than usual in size. No focal abnormalities are  seen. Spleen: Unremarkable. Adrenals/Urinary Tract: Adrenals are unremarkable. Stomach/Bowel: Stomach is unremarkable. Small bowel loops are not dilated. Appendix is not seen. There is no pericecal inflammation. There is no significant wall thickening in colon. There is no pericolic stranding. Vascular/Lymphatic: Scattered arterial calcifications are seen. Reproductive: Unremarkable. Other: There is no ascites or pneumoperitoneum. Umbilical hernia containing fat is seen. Bilateral inguinal hernias containing fat are seen. Musculoskeletal: Unremarkable. IMPRESSION: There is no evidence of intestinal obstruction or pneumoperitoneum. There is no hydronephrosis. There is aneurysmal dilation of ascending thoracic aorta measuring 4.7 cm. Ascending thoracic aortic aneurysm. Recommend semi-annual imaging followup by CTA or MRA and referral to cardiothoracic surgery if not already obtained. This recommendation follows 2010 ACCF/AHA/AATS/ACR/ASA/SCA/SCAI/SIR/STS/SVM Guidelines for the Diagnosis and Management of Patients With Thoracic Aortic Disease. Circulation. 2010; 121: M094-B096. Aortic aneurysm NOS (ICD10-I71.9) Other findings as described in the body of the report. Electronically Signed   By: Elmer Picker M.D.   On: 12/19/2021 15:46   DG Knee 2 Views Right  Result Date: 12/19/2021 CLINICAL DATA:  RIGHT knee pain EXAM: RIGHT KNEE - 1-2 VIEW COMPARISON:  None Available. FINDINGS: Osseous alignment is normal. No fracture line or displaced fracture fragment is seen. No acute-appearing cortical irregularity or osseous lesion. Tricompartmental DJD, mild to moderate in degree with associated joint space narrowings and mild osseous spurring. Joint effusion within the suprapatellar bursa. Surrounding superficial soft tissues are unremarkable. IMPRESSION: 1. Tricompartmental DJD, mild to moderate in degree. 2. Joint effusion. 3. No acute-appearing osseous abnormality. Electronically Signed   By: Franki Cabot M.D.    On: 12/19/2021 12:03   DG Chest Port 1 View  Result Date: 12/19/2021 CLINICAL DATA:  Questionable sepsis. EXAM: PORTABLE CHEST 1 VIEW COMPARISON:  Chest x-ray dated 11/09/2013. FINDINGS: Heart size and mediastinal contours are stable. Lungs are clear. No pleural effusion or pneumothorax is seen. Osseous structures about the chest are unremarkable. IMPRESSION: No active disease. No evidence of pneumonia or pulmonary edema. Electronically Signed   By: Franki Cabot M.D.   On: 12/19/2021 12:02    Microbiology: Results for orders placed or performed during the hospital encounter of 12/19/21  Urine Culture     Status: Abnormal   Collection Time: 12/19/21 11:04 AM   Specimen: In/Out Cath Urine  Result Value Ref Range Status   Specimen Description IN/OUT CATH URINE  Final   Special Requests   Final    NONE Performed at [facility], [address] [place], [place] [address]    Culture MULTIPLE SPECIES PRESENT, SUGGEST RECOLLECTION (A)  Final   Report Status 12/20/2021 FINAL  Final  Resp Panel by RT-PCR (Flu A&B, Covid) Anterior Nasal Swab     Status: None   Collection Time: 12/19/21 11:05 AM   Specimen: Anterior Nasal Swab  Result Value Ref Range  Status   SARS Coronavirus 2 by RT PCR NEGATIVE NEGATIVE Final    Comment: (NOTE) SARS-CoV-2 target nucleic acids are NOT DETECTED.  The SARS-CoV-2 RNA is generally detectable in upper respiratory specimens during the acute phase of infection. The lowest concentration of SARS-CoV-2 viral copies this assay can detect is 138 copies/mL. A negative result does not preclude SARS-Cov-2 infection and should not be used as the sole basis for treatment or other patient management decisions. A negative result may occur with  improper specimen collection/handling, submission of specimen other than nasopharyngeal swab, presence of viral mutation(s) within the areas targeted by this assay, and inadequate number of viral copies(<138 copies/mL). A  negative result must be combined with clinical observations, patient history, and epidemiological information. The expected result is Negative.  Fact Sheet for Patients:  EntrepreneurPulse.com.au  Fact Sheet for Healthcare Providers:  IncredibleEmployment.be  This test is no t yet approved or cleared by the [place] FDA and  has been authorized for detection and/or diagnosis of SARS-CoV-2 by FDA under an Emergency Use Authorization (EUA). This EUA will remain  in effect (meaning this test can be used) for the duration of the COVID-19 declaration under Section 564(b)(1) of the Act, 21 U.S.C.section 360bbb-3(b)(1), unless the authorization is terminated  or revoked sooner.       Influenza A by PCR NEGATIVE NEGATIVE Final   Influenza B by PCR NEGATIVE NEGATIVE Final    Comment: (NOTE) The Xpert Xpress SARS-CoV-2/FLU/RSV plus assay is intended as an aid in the diagnosis of influenza from Nasopharyngeal swab specimens and should not be used as a sole basis for treatment. Nasal washings and aspirates are unacceptable for Xpert Xpress SARS-CoV-2/FLU/RSV testing.  Fact Sheet for Patients: EntrepreneurPulse.com.au  Fact Sheet for Healthcare Providers: IncredibleEmployment.be  This test is not yet approved or cleared by the [place] FDA and has been authorized for detection and/or diagnosis of SARS-CoV-2 by FDA under an Emergency Use Authorization (EUA). This EUA will remain in effect (meaning this test can be used) for the duration of the COVID-19 declaration under Section 564(b)(1) of the Act, 21 U.S.C. section 360bbb-3(b)(1), unless the authorization is terminated or revoked.  Performed at [facility], [place] [address], [place], [place] [address]   Blood Culture (routine x 2)     Status: None (Preliminary result)   Collection Time: 12/19/21 11:40 AM   Specimen: BLOOD  Result Value Ref Range  Status   Specimen Description BLOOD LEFT ANTECUBITAL  Final   Special Requests   Final    BOTTLES DRAWN AEROBIC AND ANAEROBIC Blood Culture adequate volume   Culture   Final    NO GROWTH 4 DAYS Performed at [facility], [place] [address], [place], [place] [address]    Report Status PENDING  Incomplete  Body fluid culture w Gram Stain     Status: None   Collection Time: 12/19/21  2:44 PM   Specimen: Synovium; Body Fluid  Result Value Ref Range Status   Specimen Description SYNOVIAL RIGHT KNEE  Final   Special Requests NONE  Final   Gram Stain   Final    NO ORGANISMS SEEN ABUNDANT WBC PRESENT, PREDOMINANTLY PMN    Culture   Final    NO GROWTH 3 DAYS Performed at [facility], [address] [place], [place] [address]    Report Status 12/22/2021 FINAL  Final  Gram stain     Status: None   Collection Time: 12/19/21  2:44 PM  Specimen: Synovium; Body Fluid  Result Value Ref Range Status   Specimen Description SYNOVIAL RIGHT KNEE  Final   Special Requests NONE  Final   Gram Stain   Final    FEW WBC PRESENT, PREDOMINANTLY PMN NO ORGANISMS SEEN Performed at [facility], [address], [place], [place] [address]    Report Status 12/19/2021 FINAL  Final  Blood Culture (routine x 2)     Status: None (Preliminary result)   Collection Time: 12/20/21  1:57 AM   Specimen: BLOOD  Result Value Ref Range Status   Specimen Description BLOOD RIGHT ANTECUBITAL  Final   Special Requests   Final    BOTTLES DRAWN AEROBIC AND ANAEROBIC Blood Culture adequate volume   Culture   Final    NO GROWTH 3 DAYS Performed at [facility], [place] [address] [place], [place] [address]    Report Status PENDING  Incomplete    Labs: CBC: Recent Labs  Lab 12/19/21 1139 12/19/21 1155 12/20/21 0157 12/22/21 0321  WBC 12.3*  --  10.9* 11.0*  NEUTROABS 9.9*  --   --  10.1*  HGB 11.0* 12.2* 10.9* 10.2*  HCT 33.4* 36.0* 32.1* 29.2*  MCV 97.9  --  94.4 91.8  PLT 250  --  250  416   Basic Metabolic Panel: Recent Labs  Lab 12/19/21 1139 12/19/21 1155 12/20/21 0157 12/22/21 0321 12/23/21 0104  NA 136 138 134* 132* 135  K 3.6 3.6 4.0 4.5 4.0  CL 99 100 100 99 100  CO2 21*  --  21* 24 27  GLUCOSE 153* 153* 288* 302* 259*  BUN 23* 22* 20 43* 41*  CREATININE 2.75* 2.40* 1.98* 2.06* 2.06*  CALCIUM 9.6  --  9.1 9.0 9.3  MG  --   --   --   --  2.1   Liver Function Tests: Recent Labs  Lab 12/19/21 1139  AST 17  ALT 13  ALKPHOS 71  BILITOT 1.6*  PROT 9.0*  ALBUMIN 3.1*   CBG: Recent Labs  Lab 12/22/21 1119 12/22/21 1617 12/22/21 1928 12/23/21 0725 12/23/21 1202  GLUCAP 265* 195* 211* 229* 217*    Discharge time spent: greater than 30 minutes.  Signed: Bonnell Public, MD [facility] 12/23/2021

## 2021-12-23 NOTE — Plan of Care (Signed)
  Problem: Education: Goal: Ability to describe self-care measures that may prevent or decrease complications (Diabetes Survival Skills Education) will improve Outcome: Not Progressing Goal: Individualized Educational Video(s) Outcome: Not Progressing   Problem: Coping: Goal: Ability to adjust to condition or change in health will improve Outcome: Not Progressing   Problem: Fluid Volume: Goal: Ability to maintain a balanced intake and output will improve Outcome: Not Progressing   Problem: Health Behavior/Discharge Planning: Goal: Ability to identify and utilize available resources and services will improve Outcome: Not Progressing Goal: Ability to manage health-related needs will improve Outcome: Not Progressing   Problem: Skin Integrity: Goal: Risk for impaired skin integrity will decrease Outcome: Not Progressing   Problem: Tissue Perfusion: Goal: Adequacy of tissue perfusion will improve Outcome: Not Progressing

## 2021-12-23 NOTE — Progress Notes (Signed)
Received referral to assist with a RW. Met with pt at bedside. He plans to return home with the support of his mother and son who lives with him. He agrees with DME. Contacted Jasmine with [facility] for DME referral.

## 2021-12-24 LAB — CULTURE, BLOOD (ROUTINE X 2)
Culture: NO GROWTH
Special Requests: ADEQUATE

## 2021-12-25 LAB — CULTURE, BLOOD (ROUTINE X 2)
Culture: NO GROWTH
Special Requests: ADEQUATE

## 2021-12-26 ENCOUNTER — Telehealth: Payer: Self-pay

## 2021-12-26 NOTE — Telephone Encounter (Signed)
Transition Care Management Follow-up Telephone Call Date of discharge and from where: 12/23/2021, [facility] How have you been since you were released from the hospital? He stated he is feeling a little better.  Plans to return to work 01/01/2022 Any questions or concerns? No  Items Reviewed: Did the pt receive and understand the discharge instructions provided? Yes  Medications obtained and verified? Yes - he said that he has all of his medications and did not have any questions about the med regime  Other? No  Any new allergies since your discharge? No  Dietary orders reviewed? Yes Do you have support at home? Yes   Home Care and Equipment/Supplies: Were home health services ordered? no If so, what is the name of the agency? N/a  Has the agency set up a time to come to the patient's home? not applicable Were any new equipment or medical supplies ordered?  Yes: RW What is the name of the medical supply agency?  [facility] Were you able to get the supplies/equipment? yes Do you have any questions related to the use of the equipment or supplies? No  Functional Questionnaire: (I = Independent and D = Dependent) ADLs: independent. Has a RW but said he does not need to use it.    Follow up appointments reviewed:  PCP Hospital f/u appt confirmed? Yes  Scheduled to see Gwinda Passe, NP -9//20/203.   Specialist Hospital f/u appt confirmed?  None scheduled at this time    Are transportation arrangements needed? No  If their condition worsens, is the pt aware to call PCP or go to the Emergency Dept.? Yes Was the patient provided with contact information for the PCP's office or ED? Yes Was to pt encouraged to call back with questions or concerns? Yes

## 2022-01-10 ENCOUNTER — Ambulatory Visit (INDEPENDENT_AMBULATORY_CARE_PROVIDER_SITE_OTHER): Payer: BC Managed Care – PPO | Admitting: Primary Care

## 2022-01-10 ENCOUNTER — Encounter (INDEPENDENT_AMBULATORY_CARE_PROVIDER_SITE_OTHER): Payer: Self-pay | Admitting: Primary Care

## 2022-01-10 VITALS — BP 126/87 | HR 57 | Resp 16 | Wt 186.8 lb

## 2022-01-10 DIAGNOSIS — Z8739 Personal history of other diseases of the musculoskeletal system and connective tissue: Secondary | ICD-10-CM

## 2022-01-10 DIAGNOSIS — Z760 Encounter for issue of repeat prescription: Secondary | ICD-10-CM

## 2022-01-10 DIAGNOSIS — Z09 Encounter for follow-up examination after completed treatment for conditions other than malignant neoplasm: Secondary | ICD-10-CM | POA: Diagnosis not present

## 2022-01-10 DIAGNOSIS — R5381 Other malaise: Secondary | ICD-10-CM

## 2022-01-10 MED ORDER — ALLOPURINOL 100 MG PO TABS: 100.0000 mg | ORAL_TABLET | Freq: Every day | ORAL | 1 refills | Status: DC

## 2022-01-11 ENCOUNTER — Telehealth (INDEPENDENT_AMBULATORY_CARE_PROVIDER_SITE_OTHER): Payer: Self-pay | Admitting: Primary Care

## 2022-01-11 NOTE — Telephone Encounter (Signed)
Pt spouse reports that [facility] informed them that they have not received the Rx for allopurinol (ZYLOPRIM) 100 MG tablet. Pt spouse requests that the Rx be sent to the pharmacy asap.

## 2022-01-11 NOTE — Progress Notes (Signed)
[facility]   Subjective:  [name] is a 61 y.o. male presents for hospital follow up  Admit date to the hospital was 12/19/21, patient was discharged from the hospital on 12/23/21, patient was admitted for: Hypertension, Type 2 diabetes mellitus without complication, without long-term current use of insulin (HCC),Non compliance w medication regimen Alcoholism (HCC),History of gout, Neuropathy, Sepsis ([place]), Joint swelling and.Right knee pain.  He presents today with a walker barely able to stand in excruciating pain.  Asked patient was that alcohol worse how he is feeling and what he is unable to do at this time.  He has left hand weakness unable to raise it greater than 30 degrees.  He is in a bent over position not able to stand straight.  Explained with his alcoholism that pain medication was not the answer order solution.  Suggested and wife agreed alcohol rehab.  However the patient is a wound that must agree to treatment.  He has not had any alcohol since discharge all the because he is not able to get it nor when his wife bring it to him. Patient has No headache, No chest pain, No abdominal pain - No Nausea, No new weakness tingling or numbness, No Cough - shortness of breath   Past Medical History:  Diagnosis Date   Abnormal electrocardiogram (ECG) (EKG)    Dehydration    Dizziness    DM (diabetes mellitus) (HCC)    ED (erectile dysfunction)    Gout    Hypertension    Lymphangitis of groin    Scrotal pain    Sprain of left wrist    Urinary retention      Allergies  Allergen Reactions   Lisinopril Cough      Current Outpatient Medications on File Prior to Visit  Medication Sig Dispense Refill   amLODipine (NORVASC) 10 MG tablet Take 1 tablet (10 mg total) by mouth daily. 90 tablet 1   atorvastatin (LIPITOR) 10 MG tablet Take 1 tablet (10 mg total) by mouth daily. 90 tablet 1   carvedilol (COREG) 6.25 MG tablet Take 1 tablet (6.25 mg total) by mouth 2  (two) times daily with a meal. 180 tablet 1   empagliflozin (JARDIANCE) 10 MG TABS tablet Take 1 tablet (10 mg total) by mouth daily before breakfast. 90 tablet 1   folic acid (FOLVITE) 1 MG tablet Take 1 tablet (1 mg total) by mouth daily. 30 tablet 1   losartan (COZAAR) 25 MG tablet Take 1 tablet (25 mg total) by mouth daily. 90 tablet 1   metFORMIN (GLUCOPHAGE XR) 500 MG 24 hr tablet Take 1 tablet (500 mg total) by mouth daily with breakfast. 30 tablet 0   Multiple Vitamin (MULTIVITAMIN WITH MINERALS) TABS tablet Take 1 tablet by mouth daily. 30 tablet 0   oxyCODONE (OXY IR/ROXICODONE) 5 MG immediate release tablet Take 1 tablet (5 mg total) by mouth every 4 (four) hours as needed for moderate pain. 30 tablet 0   thiamine (VITAMIN B-1) 100 MG tablet Take 1 tablet (100 mg total) by mouth daily. 30 tablet 0   No current facility-administered medications on file prior to visit.   Review of System: Comprehensive ROS Pertinent positive and negative noted in HPI    Objective:  BP 126/87   Pulse (!) 57   Resp 16   Wt 186 lb 12.8 oz (84.7 kg)   SpO2 95%   BMI 26.80 kg/m   Filed Weights   01/10/22 1436  Weight:  186 lb 12.8 oz (84.7 kg)    Physical Exam: General Appearance: Well nourished, in  apparent distress. Eyes: PERRLA, EOMs, conjunctiva no swelling or erythema Sinuses: No Frontal/maxillary tenderness ENT/Mouth: Ext aud canals clear,  Hearing normal.  Neck: Supple, thyroid normal.  Respiratory: Respiratory effort normal, BS equal bilaterally without rales, rhonchi, wheezing or stridor.  Cardio: RRR with no MRGs.  Abdomen: Soft, + BS.  Non tender, no guarding, rebound, hernias, masses. Lymphatics: Non tender without lymphadenopathy.  Musculoskeletal: Decreased range of motion upper and lower with abnormal gait.  Skin: Warm, dry without rashes, lesions, ecchymosis.  Neuro: Cranial nerves intact. Normal muscle tone, no cerebellar symptoms. Sensation intact.  Psych: Awake and  oriented X 3, normal affect, Insight and Judgment appropriate.   Tony Dean was seen today for hospitalization follow-up.  Diagnoses and all orders for this visit:  History of gout He is very much aware that alcohol increases his flareups -     allopurinol (ZYLOPRIM) 100 MG tablet; Take 1 tablet (100 mg total) by mouth daily.  Debility Secondary to alcoholism -     Ambulatory referral to Physical Therapy -     Ambulatory referral to Pain Clinic  Medication refill  allopurinol (ZYLOPRIM) 100 MG tablet; Take 1 tablet (100 mg total) by mouth daily.  Hospital discharge follow-up Discharged to home with DME equipment rolling walker.  Medications defined on discharge summary.  He did have a prescription for oxycodone and Oxy IR requesting refill.  Refill request denied   This note has been created with Surveyor, quantity. Any transcriptional errors are unintentional.   Kerin Perna, NP 01/11/2022, 9:17 PM

## 2022-01-11 NOTE — Telephone Encounter (Signed)
[facility] called and spoke to Mina, Merchant [employer] officer about the refill(s) Allopurinol requested. Advised it was sent on 01/10/22 #30/1 refill(s). She says that is on his profile, however he picked up a 30 day supply on 12/24/21 and it's too early for insurance to pay again. He can pick up again on 01/20/22 or 01/21/22. Patient called and advised, he verbalized understanding and says his son went by there today to pick up something and it wasn't ready. Advised to call us back once he figures out what is needed, he verbalized understanding.

## 2022-01-16 ENCOUNTER — Other Ambulatory Visit (INDEPENDENT_AMBULATORY_CARE_PROVIDER_SITE_OTHER): Payer: Self-pay | Admitting: Primary Care

## 2022-01-16 DIAGNOSIS — R5381 Other malaise: Secondary | ICD-10-CM

## 2022-01-18 ENCOUNTER — Ambulatory Visit: Payer: BC Managed Care – PPO

## 2022-01-23 NOTE — Therapy (Signed)
OUTPATIENT PHYSICAL THERAPY LOWER EXTREMITY EVALUATION   Patient Name: Tony Dean MRN: [id number] DOB:May 06, 1960, 61 y.o., male Today's Date: 01/24/2022   PT End of Session - 01/24/22 1406     Visit Number 1    Number of Visits 17    Date for PT Re-Evaluation 03/21/22    Authorization Type BCBS    PT Start Time 20    PT Stop Time 8101    PT Time Calculation (min) 35 min    Activity Tolerance Patient tolerated treatment well    Behavior During Therapy WFL for tasks assessed/performed             Past Medical History:  Diagnosis Date   Abnormal electrocardiogram (ECG) (EKG)    Dehydration    Dizziness    DM (diabetes mellitus) ([place])    ED (erectile dysfunction)    Gout    Hypertension    Lymphangitis of groin    Scrotal pain    Sprain of left wrist    Urinary retention    Past Surgical History:  Procedure Laterality Date   APPENDECTOMY     INGUINAL HERNIA REPAIR  1990's   right   Patient Active Problem List   Diagnosis Date Noted   Right knee pain 12/20/2021   Sepsis ([place]) 12/19/2021   Joint swelling 12/19/2021   Hyperuricemia 05/12/2020   Hypertension 05/12/2020   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   Type 2 diabetes mellitus without complication, without long-term current use of insulin ([place]) 05/12/2020   Non compliance w medication regimen 05/12/2020   Alcoholism ([place]) 05/12/2020   Erectile disorder due to medical condition in male 05/12/2020   History of gout 05/12/2020   Other constipation 05/12/2020   Neuropathy 05/12/2020    PCP: Kerin Perna, NP  REFERRING PROVIDER: Kerin Perna, NP  REFERRING DIAG: R53.81 (ICD-10-CM) - Physical deconditioning  THERAPY DIAG:  Muscle weakness (generalized) - Plan: PT plan of care cert/re-cert  Other abnormalities of gait and mobility - Plan: PT plan of care cert/re-cert  Difficulty in walking, not elsewhere classified - Plan: PT plan of care cert/re-cert  Rationale for Evaluation and  Treatment Rehabilitation  ONSET DATE: Chronic  SUBJECTIVE:   SUBJECTIVE STATEMENT: Pt presents to PT with recent hospitalization secondary to severe pain from gout and decrease in physical function. Notes lingering posterior L LE pain as well as L hand pain and swelling. Has not been back to work since being in hospital, does not believe he can physically lift objects right now require for custodial work.   PERTINENT HISTORY: HTN, DMII, Gout  PAIN:  Are you having pain?  Yes: NPRS scale: 7/10 Pain location: posterior L knee Pain description: cramp Aggravating factors: knee bending, prolonged standing Relieving factors: rest  PRECAUTIONS: Fall  WEIGHT BEARING RESTRICTIONS No  FALLS:  Has patient fallen in last 6 months? No  LIVING ENVIRONMENT: Lives with: lives with their family Lives in: House/apartment Stairs: Yes: External: 15 steps; bilateral but cannot reach both Has following equipment at home: Walker - 2 wheeled  OCCUPATION: Custodian for [employer]   PLOF: Independent and Independent with basic ADLs  PATIENT GOALS: pt wants to decrease pain and improve independence with mobility; needs to be able to lift 60lbs for work   OBJECTIVE:   DIAGNOSTIC FINDINGS:  See imaging  PATIENT SURVEYS:  FOTO: 46% function; 62% predicted  COGNITION:  Overall cognitive status: Within functional limits for tasks assessed     SENSATION: WFL  POSTURE: rounded shoulders, forward head,  and medium body habitus  PALPATION: Very slight TTP to distal L hamstring   LOWER EXTREMITY ROM:  Active ROM Right eval Left eval  Hip flexion    Hip extension    Hip abduction    Hip adduction    Hip internal rotation    Hip external rotation    Knee flexion 101 88  Knee extension    Ankle dorsiflexion    Ankle plantarflexion    Ankle inversion    Ankle eversion     (Blank rows = not tested)  LOWER EXTREMITY MMT:  MMT Right eval Left eval  Hip flexion 4/5 4/5  Hip extension     Hip abduction 3+/5 3+/5  Hip adduction 4/5 4/5  Hip internal rotation    Hip external rotation    Knee flexion 4/5 3+/5   Knee extension 4/5 4/5  Ankle dorsiflexion    Ankle plantarflexion    Ankle inversion    Ankle eversion     (Blank rows = not tested)  LOWER EXTREMITY SPECIAL TESTS:  DNT  FUNCTIONAL TESTS:  30 Second Sit to Stand: 8 reps - with UE support  GAIT: Distance walked: 40ft and 8 stairs Assistive device utilized: None Level of assistance: Complete Independence Comments: step-to gait on stairs, R LE leading; ambulates with bilateral out-toeing     TODAY'S TREATMENT: [facility] Adult PT Treatment:                                                DATE: 01/24/2022 Therapeutic Exercise: Quad sets x 5 - 5" hold Supine SLR x 10 each S/L hip abd x 5 each Seated heel slide x 5 - 5 " hold L  PATIENT EDUCATION:  Education details: eval findings, FOTO, HEP, POC Person educated: Patient Education method: Explanation, Demonstration, and Handouts Education comprehension: verbalized understanding and returned demonstration   HOME EXERCISE PROGRAM: Access Code: [email or web address] URL: [email or web address] Date: 01/24/2022 Prepared by: Edwinna Areola  Exercises - Supine Quadricep Sets  - 1 x daily - 7 x weekly - 2 sets - 10 reps - 5 sec hold - Active Straight Leg Raise with Quad Set  - 1 x daily - 7 x weekly - 2 sets - 10 reps - Sidelying Hip Abduction  - 1 x daily - 7 x weekly - 2 sets - 10 reps - Seated Heel Slide  - 1 x daily - 7 x weekly - 2 sets - 10 reps - 5 sec hold  ASSESSMENT:  CLINICAL IMPRESSION: Patient is a 61 y.o. M who was seen today for physical therapy evaluation and treatment for pain and physical deconditioning. Physical findings are consistent with PCP impression, as pt demonstrates functional LE weakness and decrease in functional mobility. His FOTO score indicates that he is operating below PLOF. Pt would benefit from skilled PT services working  on improving strength and functional activity tolerance.     OBJECTIVE IMPAIRMENTS Abnormal gait, decreased activity tolerance, decreased endurance, decreased mobility, difficulty walking, decreased ROM, decreased strength, and pain  ACTIVITY LIMITATIONS carrying, lifting, standing, squatting, stairs, transfers, and locomotion level  PARTICIPATION LIMITATIONS: driving, shopping, community activity, and occupation  PERSONAL FACTORS Fitness and 3+ comorbidities: HTN, DMII, Gout  are also affecting patient's functional outcome.   REHAB POTENTIAL: Excellent  CLINICAL DECISION MAKING: Stable/uncomplicated  EVALUATION COMPLEXITY: Low   GOALS: Goals reviewed with patient?  No  SHORT TERM GOALS: Target date: 02/14/2022  Pt will be compliant and knowledgeable with initial HEP for improved comfort and carryover Baseline: initial HEP given  Goal status: INITIAL  2.  Pt will self report L LE pain no greater than 5/10 for improved comfort and functional ability Baseline: 7/10 at worst Goal status: INITIAL   LONG TERM GOALS: Target date: 03/21/2022   Pt will improve FOTO function score to no less than 62% as proxy for functional improvement Baseline: 46% function Goal status: INITIAL   2.  Pt will self report L LE pain no greater than 2/10 for improved comfort and functional ability Baseline: 7/10 at worst Goal status: INITIAL  3.  Pt will increase 30 Second Sit to Stand rep count to no less than 10 reps for improved balance, strength, and functional mobility Baseline: 8 reps - with UE  Goal status: INITIAL   4.  Pt will improve L knee flexion to no less than 105 degrees for improved functional mobility and decreased pain Baseline: 88 deg Goal status: INITIAL  PLAN: PT FREQUENCY: 1-2x/week  PT DURATION: 8 weeks  PLANNED INTERVENTIONS: Therapeutic exercises, Therapeutic activity, Neuromuscular re-education, Balance training, Gait training, Patient/Family education, Self Care,  Joint mobilization, Dry Needling, Electrical stimulation, Cryotherapy, Moist heat, Vasopneumatic device, Manual therapy, and Re-evaluation  PLAN FOR NEXT SESSION: assess HEP response, LE strengthening, improve activity tolerance   Eloy End, PT 01/24/2022, 2:27 PM

## 2022-01-24 ENCOUNTER — Other Ambulatory Visit: Payer: Self-pay

## 2022-01-24 ENCOUNTER — Ambulatory Visit: Payer: BC Managed Care – PPO | Attending: Primary Care

## 2022-01-24 DIAGNOSIS — R5381 Other malaise: Secondary | ICD-10-CM | POA: Diagnosis not present

## 2022-01-24 DIAGNOSIS — M6281 Muscle weakness (generalized): Secondary | ICD-10-CM | POA: Insufficient documentation

## 2022-01-24 DIAGNOSIS — R2689 Other abnormalities of gait and mobility: Secondary | ICD-10-CM | POA: Insufficient documentation

## 2022-01-24 DIAGNOSIS — R262 Difficulty in walking, not elsewhere classified: Secondary | ICD-10-CM | POA: Diagnosis present

## 2022-01-31 ENCOUNTER — Ambulatory Visit: Payer: BC Managed Care – PPO

## 2022-01-31 DIAGNOSIS — M6281 Muscle weakness (generalized): Secondary | ICD-10-CM

## 2022-01-31 DIAGNOSIS — R2689 Other abnormalities of gait and mobility: Secondary | ICD-10-CM

## 2022-01-31 DIAGNOSIS — R262 Difficulty in walking, not elsewhere classified: Secondary | ICD-10-CM

## 2022-01-31 NOTE — Therapy (Signed)
OUTPATIENT PHYSICAL THERAPY TREATMENT NOTE   Patient Name: Tony Dean MRN: [id number] DOB:12-24-1960, 61 y.o., male Today's Date: 01/31/2022  PCP: Grayce Sessions, NP REFERRING PROVIDER: Grayce Sessions, NP  END OF SESSION:   PT End of Session - 01/31/22 1121     Visit Number 2    Number of Visits 17    Date for PT Re-Evaluation 03/21/22    Authorization Type BCBS    PT Start Time 1130    PT Stop Time 1155    PT Time Calculation (min) 25 min    Activity Tolerance Patient tolerated treatment well    Behavior During Therapy WFL for tasks assessed/performed             Past Medical History:  Diagnosis Date   Abnormal electrocardiogram (ECG) (EKG)    Dehydration    Dizziness    DM (diabetes mellitus) (HCC)    ED (erectile dysfunction)    Gout    Hypertension    Lymphangitis of groin    Scrotal pain    Sprain of left wrist    Urinary retention    Past Surgical History:  Procedure Laterality Date   APPENDECTOMY     INGUINAL HERNIA REPAIR  1990's   right   Patient Active Problem List   Diagnosis Date Noted   Right knee pain 12/20/2021   Sepsis (HCC) 12/19/2021   Joint swelling 12/19/2021   Hyperuricemia 05/12/2020   Hypertension 05/12/2020   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   Type 2 diabetes mellitus without complication, without long-term current use of insulin (HCC) 05/12/2020   Non compliance w medication regimen 05/12/2020   Alcoholism (HCC) 05/12/2020   Erectile disorder due to medical condition in male 05/12/2020   History of gout 05/12/2020   Other constipation 05/12/2020   Neuropathy 05/12/2020    REFERRING DIAG: R53.81 (ICD-10-CM) - Physical deconditioning  THERAPY DIAG:  Muscle weakness (generalized)  Other abnormalities of gait and mobility  Difficulty in walking, not elsewhere classified  Rationale for Evaluation and Treatment Rehabilitation  PERTINENT HISTORY: HTN, DMII, Gout  PRECAUTIONS: Fall  SUBJECTIVE: Pt  presents to PT with reports of sharp increase in pain with no onset of R knee pain. Notes initial HEP has been going well, notes that his L knee is doing better. Pt is ready to begin PT at this time.   PAIN:  Are you having pain?  Yes: NPRS scale: 8/10 Pain location: R knee, R ankle, L posterior knee Pain description: cramp Aggravating factors: knee bending, prolonged standing Relieving factors: rest   OBJECTIVE: (objective measures completed at initial evaluation unless otherwise dated)  DIAGNOSTIC FINDINGS:  See imaging   PATIENT SURVEYS:  FOTO: 46% function; 62% predicted   COGNITION:           Overall cognitive status: Within functional limits for tasks assessed                          SENSATION: WFL   POSTURE: rounded shoulders, forward head, and medium body habitus   PALPATION: Very slight TTP to distal L hamstring    LOWER EXTREMITY ROM:   Active ROM Right eval Left eval  Hip flexion      Hip extension      Hip abduction      Hip adduction      Hip internal rotation      Hip external rotation      Knee flexion 101 88  Knee  extension      Ankle dorsiflexion      Ankle plantarflexion      Ankle inversion      Ankle eversion       (Blank rows = not tested)   LOWER EXTREMITY MMT:   MMT Right eval Left eval  Hip flexion 4/5 4/5  Hip extension      Hip abduction 3+/5 3+/5  Hip adduction 4/5 4/5  Hip internal rotation      Hip external rotation      Knee flexion 4/5 3+/5   Knee extension 4/5 4/5  Ankle dorsiflexion      Ankle plantarflexion      Ankle inversion      Ankle eversion       (Blank rows = not tested)   LOWER EXTREMITY SPECIAL TESTS:  DNT   FUNCTIONAL TESTS:  30 Second Sit to Stand: 8 reps - with UE support   GAIT: Distance walked: 49ft and 8 stairs Assistive device utilized: None Level of assistance: Complete Independence Comments: step-to gait on stairs, R LE leading; ambulates with bilateral out-toeing        TODAY'S  TREATMENT: [facility] Adult PT Treatment:                                                DATE: 01/31/2022 Therapeutic Exercise: NuStep lvl 4 UE/LE x 4 min while taking subjective LAQ x 10 each SAQ 2x10 - 5" hold S/L hip abd 2x10 each Supine quad sets 2x10 5" hold Supine SLR 3x10 each Seated heel slide x 5 - 5 " hold L Modalities: Gameready  R knee 38 deg Low compression  [facility] Adult PT Treatment:                                                DATE: 01/24/2022 Therapeutic Exercise: Quad sets x 5 - 5" hold Supine SLR x 10 each S/L hip abd x 5 each Seated heel slide x 5 - 5 " hold L   PATIENT EDUCATION:  Education details: continue HEP Person educated: Patient Education method: Explanation, Demonstration, and Handouts Education comprehension: verbalized understanding and returned demonstration     HOME EXERCISE PROGRAM: Access Code: [email or web address] URL: [email or web address] Date: 01/24/2022 Prepared by: Edwinna Areola   Exercises - Supine Quadricep Sets  - 1 x daily - 7 x weekly - 2 sets - 10 reps - 5 sec hold - Active Straight Leg Raise with Quad Set  - 1 x daily - 7 x weekly - 2 sets - 10 reps - Sidelying Hip Abduction  - 1 x daily - 7 x weekly - 2 sets - 10 reps - Seated Heel Slide  - 1 x daily - 7 x weekly - 2 sets - 10 reps - 5 sec hold   ASSESSMENT:   CLINICAL IMPRESSION: Pt was able to complete all prescribed exercises with no adverse effect but was limited by pain. Therapy focused on improving LE strength and functional mobility. Will continue per POC as prescribed, follow up on R knee pain.      OBJECTIVE IMPAIRMENTS Abnormal gait, decreased activity tolerance, decreased endurance, decreased mobility, difficulty walking, decreased ROM, decreased strength, and pain   ACTIVITY LIMITATIONS  carrying, lifting, standing, squatting, stairs, transfers, and locomotion level   PARTICIPATION LIMITATIONS: driving, shopping, community activity, and occupation    PERSONAL FACTORS Fitness and 3+ comorbidities: HTN, DMII, Gout  are also affecting patient's functional outcome.      GOALS: Goals reviewed with patient? No   SHORT TERM GOALS: Target date: 02/14/2022  Pt will be compliant and knowledgeable with initial HEP for improved comfort and carryover Baseline: initial HEP given  Goal status: INITIAL   2.  Pt will self report L LE pain no greater than 5/10 for improved comfort and functional ability Baseline: 7/10 at worst Goal status: INITIAL    LONG TERM GOALS: Target date: 03/21/2022    Pt will improve FOTO function score to no less than 62% as proxy for functional improvement Baseline: 46% function Goal status: INITIAL    2.  Pt will self report L LE pain no greater than 2/10 for improved comfort and functional ability Baseline: 7/10 at worst Goal status: INITIAL   3.  Pt will increase 30 Second Sit to Stand rep count to no less than 10 reps for improved balance, strength, and functional mobility Baseline: 8 reps - with UE  Goal status: INITIAL    4.  Pt will improve L knee flexion to no less than 105 degrees for improved functional mobility and decreased pain Baseline: 88 deg Goal status: INITIAL   PLAN: PT FREQUENCY: 1-2x/week   PT DURATION: 8 weeks   PLANNED INTERVENTIONS: Therapeutic exercises, Therapeutic activity, Neuromuscular re-education, Balance training, Gait training, Patient/Family education, Self Care, Joint mobilization, Dry Needling, Electrical stimulation, Cryotherapy, Moist heat, Vasopneumatic device, Manual therapy, and Re-evaluation   PLAN FOR NEXT SESSION: assess HEP response, LE strengthening, improve activity tolerance    Ward Chatters, PT 01/31/2022, 1:26 PM

## 2022-02-05 ENCOUNTER — Ambulatory Visit: Payer: BC Managed Care – PPO

## 2022-02-05 DIAGNOSIS — M6281 Muscle weakness (generalized): Secondary | ICD-10-CM

## 2022-02-05 DIAGNOSIS — R2689 Other abnormalities of gait and mobility: Secondary | ICD-10-CM

## 2022-02-05 DIAGNOSIS — R262 Difficulty in walking, not elsewhere classified: Secondary | ICD-10-CM

## 2022-02-05 NOTE — Therapy (Signed)
OUTPATIENT PHYSICAL THERAPY TREATMENT NOTE   Patient Name: Tony Dean MRN: [id number] DOB:09-07-1960, 61 y.o., male Today's Date: 02/05/2022  PCP: Grayce Sessions, NP REFERRING PROVIDER: Grayce Sessions, NP  END OF SESSION:   PT End of Session - 02/05/22 0906     Visit Number 3    Number of Visits 17    Date for PT Re-Evaluation 03/21/22    Authorization Type BCBS    PT Start Time 0915    PT Stop Time 1000    PT Time Calculation (min) 45 min    Activity Tolerance Patient tolerated treatment well    Behavior During Therapy WFL for tasks assessed/performed              Past Medical History:  Diagnosis Date   Abnormal electrocardiogram (ECG) (EKG)    Dehydration    Dizziness    DM (diabetes mellitus) (HCC)    ED (erectile dysfunction)    Gout    Hypertension    Lymphangitis of groin    Scrotal pain    Sprain of left wrist    Urinary retention    Past Surgical History:  Procedure Laterality Date   APPENDECTOMY     INGUINAL HERNIA REPAIR  1990's   right   Patient Active Problem List   Diagnosis Date Noted   Right knee pain 12/20/2021   Sepsis (HCC) 12/19/2021   Joint swelling 12/19/2021   Hyperuricemia 05/12/2020   Hypertension 05/12/2020   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   Type 2 diabetes mellitus without complication, without long-term current use of insulin (HCC) 05/12/2020   Non compliance w medication regimen 05/12/2020   Alcoholism (HCC) 05/12/2020   Erectile disorder due to medical condition in male 05/12/2020   History of gout 05/12/2020   Other constipation 05/12/2020   Neuropathy 05/12/2020    REFERRING DIAG: R53.81 (ICD-10-CM) - Physical deconditioning  THERAPY DIAG:  Muscle weakness (generalized)  Other abnormalities of gait and mobility  Difficulty in walking, not elsewhere classified  Rationale for Evaluation and Treatment Rehabilitation  PERTINENT HISTORY: HTN, DMII, Gout  PRECAUTIONS: Fall  SUBJECTIVE: Pt  presents to PT with reports of L LE pain, notes that it continues to seem to switch sides. Also has N/T in L UE. Pt is unsure of what is causing symptoms to increase. He is ready to begin PT at this time.  PAIN:  Are you having pain?  Yes: NPRS scale: 8/10 Pain location: L posterior thigh, L UE Pain description: cramp Aggravating factors: knee bending, prolonged standing Relieving factors: rest   OBJECTIVE: (objective measures completed at initial evaluation unless otherwise dated)  DIAGNOSTIC FINDINGS:  See imaging   PATIENT SURVEYS:  FOTO: 46% function; 62% predicted   COGNITION:           Overall cognitive status: Within functional limits for tasks assessed                          SENSATION: WFL   POSTURE: rounded shoulders, forward head, and medium body habitus   PALPATION: Very slight TTP to distal L hamstring    LOWER EXTREMITY ROM:   Active ROM Right eval Left eval  Hip flexion      Hip extension      Hip abduction      Hip adduction      Hip internal rotation      Hip external rotation      Knee flexion 101 88  Knee  extension      Ankle dorsiflexion      Ankle plantarflexion      Ankle inversion      Ankle eversion       (Blank rows = not tested)   LOWER EXTREMITY MMT:   MMT Right eval Left eval  Hip flexion 4/5 4/5  Hip extension      Hip abduction 3+/5 3+/5  Hip adduction 4/5 4/5  Hip internal rotation      Hip external rotation      Knee flexion 4/5 3+/5   Knee extension 4/5 4/5  Ankle dorsiflexion      Ankle plantarflexion      Ankle inversion      Ankle eversion       (Blank rows = not tested)   LOWER EXTREMITY SPECIAL TESTS:  DNT   FUNCTIONAL TESTS:  30 Second Sit to Stand: 8 reps - with UE support   GAIT: Distance walked: 47ft and 8 stairs Assistive device utilized: None Level of assistance: Complete Independence Comments: step-to gait on stairs, R LE leading; ambulates with bilateral out-toeing        TODAY'S  TREATMENT: [facility] Adult PT Treatment:                                                DATE: 02/05/2022 Therapeutic Exercise: NuStep lvl 3 UE/LE x 5 min while taking subjective LAQ 2x10 each - 5" hold Seated heel slide x 10 - 5 " hold  Seated clamshell 3x10 GTB Seated ball squeeze 2x10 - 5" hold Supine quad sets 2x10 5" hold Supine SLR 3x10 each LTR x 10 Bridge 2x10 STS 2x5 - high table  [facility] Adult PT Treatment:                                                DATE: 01/31/2022 Therapeutic Exercise: NuStep lvl 4 UE/LE x 4 min while taking subjective LAQ x 10 each SAQ 2x10 - 5" hold S/L hip abd 2x10 each Supine quad sets 2x10 5" hold Supine SLR 3x10 each Seated heel slide x 5 - 5 " hold L Modalities: Gameready  R knee 64min 38 deg Low compression  [facility] Adult PT Treatment:                                                DATE: 01/24/2022 Therapeutic Exercise: Quad sets x 5 - 5" hold Supine SLR x 10 each S/L hip abd x 5 each Seated heel slide x 5 - 5 " hold L   PATIENT EDUCATION:  Education details: continue HEP Person educated: Patient Education method: Explanation, Demonstration, and Handouts Education comprehension: verbalized understanding and returned demonstration     HOME EXERCISE PROGRAM: Access Code: [email or web address] URL: [email or web address] Date: 02/05/2022 Prepared by: Octavio Manns  Exercises - Supine Quadricep Sets  - 1 x daily - 7 x weekly - 2 sets - 10 reps - 5 sec hold - Active Straight Leg Raise with Quad Set  - 1 x daily - 7 x weekly - 2 sets - 10 reps - Sidelying Hip  Abduction  - 1 x daily - 7 x weekly - 2 sets - 10 reps - Seated Heel Slide  - 1 x daily - 7 x weekly - 2 sets - 10 reps - 5 sec hold - Seated Hip Abduction with Resistance  - 1 x daily - 7 x weekly - 3 sets - 10 reps - Supine Lower Trunk Rotation  - 1 x daily - 7 x weekly - 3 sets - 10 reps - Supine Bridge  - 1 x daily - 7 x weekly - 3 sets - 10 reps   ASSESSMENT:   CLINICAL  IMPRESSION: Pt was able to complete all prescribed exercises with no adverse effect, but progression and general functional mobility continues to be limited by pain. Therapy focused on improving LE strength and mobility. PT will continue to assess nature of pain, has not presented as musculoskeletal in nature these last two visits. If this continues discussed with pt that we may put PT on hold until he sees PCP.      OBJECTIVE IMPAIRMENTS Abnormal gait, decreased activity tolerance, decreased endurance, decreased mobility, difficulty walking, decreased ROM, decreased strength, and pain   ACTIVITY LIMITATIONS carrying, lifting, standing, squatting, stairs, transfers, and locomotion level   PARTICIPATION LIMITATIONS: driving, shopping, community activity, and occupation   PERSONAL FACTORS Fitness and 3+ comorbidities: HTN, DMII, Gout  are also affecting patient's functional outcome.      GOALS: Goals reviewed with patient? No   SHORT TERM GOALS: Target date: 02/14/2022  Pt will be compliant and knowledgeable with initial HEP for improved comfort and carryover Baseline: initial HEP given  Goal status: INITIAL   2.  Pt will self report L LE pain no greater than 5/10 for improved comfort and functional ability Baseline: 7/10 at worst Goal status: INITIAL    LONG TERM GOALS: Target date: 03/21/2022    Pt will improve FOTO function score to no less than 62% as proxy for functional improvement Baseline: 46% function Goal status: INITIAL    2.  Pt will self report L LE pain no greater than 2/10 for improved comfort and functional ability Baseline: 7/10 at worst Goal status: INITIAL   3.  Pt will increase 30 Second Sit to Stand rep count to no less than 10 reps for improved balance, strength, and functional mobility Baseline: 8 reps - with UE  Goal status: INITIAL    4.  Pt will improve L knee flexion to no less than 105 degrees for improved functional mobility and decreased  pain Baseline: 88 deg Goal status: INITIAL   PLAN: PT FREQUENCY: 1-2x/week   PT DURATION: 8 weeks   PLANNED INTERVENTIONS: Therapeutic exercises, Therapeutic activity, Neuromuscular re-education, Balance training, Gait training, Patient/Family education, Self Care, Joint mobilization, Dry Needling, Electrical stimulation, Cryotherapy, Moist heat, Vasopneumatic device, Manual therapy, and Re-evaluation   PLAN FOR NEXT SESSION: assess HEP response, LE strengthening, improve activity tolerance    Eloy End, PT 02/05/2022, 10:11 AM

## 2022-02-06 ENCOUNTER — Other Ambulatory Visit: Payer: Self-pay | Admitting: Nurse Practitioner

## 2022-02-06 ENCOUNTER — Ambulatory Visit
Admission: RE | Admit: 2022-02-06 | Discharge: 2022-02-06 | Disposition: A | Discharge status: Home / Self Care | Payer: BC Managed Care – PPO | Source: Ambulatory Visit | Attending: Nurse Practitioner | Admitting: Nurse Practitioner

## 2022-02-06 DIAGNOSIS — R52 Pain, unspecified: Secondary | ICD-10-CM

## 2022-02-06 NOTE — Therapy (Signed)
OUTPATIENT PHYSICAL THERAPY TREATMENT NOTE   Patient Name: Tony Dean MRN: [id number] DOB:February 24, 1961, 61 y.o., male Today's Date: 02/07/2022  PCP: Kerin Perna, NP REFERRING PROVIDER: Kerin Perna, NP  END OF SESSION:   PT End of Session - 02/07/22 0907     Visit Number 4    Number of Visits 17    Date for PT Re-Evaluation 03/21/22    Authorization Type BCBS    PT Start Time 0915    PT Stop Time 0953    PT Time Calculation (min) 38 min    Activity Tolerance Patient tolerated treatment well    Behavior During Therapy WFL for tasks assessed/performed               Past Medical History:  Diagnosis Date   Abnormal electrocardiogram (ECG) (EKG)    Dehydration    Dizziness    DM (diabetes mellitus) ([place])    ED (erectile dysfunction)    Gout    Hypertension    Lymphangitis of groin    Scrotal pain    Sprain of left wrist    Urinary retention    Past Surgical History:  Procedure Laterality Date   APPENDECTOMY     INGUINAL HERNIA REPAIR  1990's   right   Patient Active Problem List   Diagnosis Date Noted   Right knee pain 12/20/2021   Sepsis ([place]) 12/19/2021   Joint swelling 12/19/2021   Hyperuricemia 05/12/2020   Hypertension 05/12/2020   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   Type 2 diabetes mellitus without complication, without long-term current use of insulin ([place]) 05/12/2020   Non compliance w medication regimen 05/12/2020   Alcoholism ([place]) 05/12/2020   Erectile disorder due to medical condition in male 05/12/2020   History of gout 05/12/2020   Other constipation 05/12/2020   Neuropathy 05/12/2020    REFERRING DIAG: R53.81 (ICD-10-CM) - Physical deconditioning  THERAPY DIAG:  Muscle weakness (generalized)  Other abnormalities of gait and mobility  Difficulty in walking, not elsewhere classified  Rationale for Evaluation and Treatment Rehabilitation  PERTINENT HISTORY: HTN, DMII, Gout  PRECAUTIONS: Fall  SUBJECTIVE: Pt  presents to PT with continued reports of L LE pain and discomfort. He had a visit with pain medicine yesterday, is starting on a new medication tomorrow. Has been fairly compliant with HEP with no adverse effect. Pt is ready to begin PT at this time.   PAIN:  Are you having pain?  Yes: NPRS scale: 8/10 Pain location: L posterior thigh, L UE Pain description: cramp Aggravating factors: knee bending, prolonged standing Relieving factors: rest   OBJECTIVE: (objective measures completed at initial evaluation unless otherwise dated)  DIAGNOSTIC FINDINGS:  See imaging   PATIENT SURVEYS:  FOTO: 46% function; 62% predicted   COGNITION:           Overall cognitive status: Within functional limits for tasks assessed                          SENSATION: WFL   POSTURE: rounded shoulders, forward head, and medium body habitus   PALPATION: Very slight TTP to distal L hamstring    LOWER EXTREMITY ROM:   Active ROM Right eval Left eval  Hip flexion      Hip extension      Hip abduction      Hip adduction      Hip internal rotation      Hip external rotation      Knee  flexion 101 88  Knee extension      Ankle dorsiflexion      Ankle plantarflexion      Ankle inversion      Ankle eversion       (Blank rows = not tested)   LOWER EXTREMITY MMT:   MMT Right eval Left eval  Hip flexion 4/5 4/5  Hip extension      Hip abduction 3+/5 3+/5  Hip adduction 4/5 4/5  Hip internal rotation      Hip external rotation      Knee flexion 4/5 3+/5   Knee extension 4/5 4/5  Ankle dorsiflexion      Ankle plantarflexion      Ankle inversion      Ankle eversion       (Blank rows = not tested)   LOWER EXTREMITY SPECIAL TESTS:  DNT   FUNCTIONAL TESTS:  30 Second Sit to Stand: 8 reps - with UE support   GAIT: Distance walked: 41ft and 8 stairs Assistive device utilized: None Level of assistance: Complete Independence Comments: step-to gait on stairs, R LE leading; ambulates with  bilateral out-toeing        TODAY'S TREATMENT: [facility] Adult PT Treatment:                                                DATE: 02/07/2022 Therapeutic Exercise: NuStep lvl 4 UE/LE x 5 min while taking subjective LAQ 2x10 each - 5" hold Seated hamstring curl 3x10 GTB Supine clamshell 3x15 GTB Supine SLR 3x10 each Supine ball squeeze 2x10 - 5" hold Supine quad sets 2x10 5" hold Seated heel slides 2x10 - 5" hold each STS 3x5 - high table  [facility] Adult PT Treatment:                                                DATE: 02/05/2022 Therapeutic Exercise: NuStep lvl 3 UE/LE x 5 min while taking subjective LAQ 2x10 each - 5" hold Seated heel slide x 10 - 5 " hold  Seated clamshell 3x10 GTB Seated ball squeeze 2x10 - 5" hold Supine quad sets 2x10 5" hold Supine SLR 3x10 each LTR x 10 Bridge 2x10 STS 2x5 - high table  [facility] Adult PT Treatment:                                                DATE: 01/31/2022 Therapeutic Exercise: NuStep lvl 4 UE/LE x 4 min while taking subjective LAQ x 10 each SAQ 2x10 - 5" hold S/L hip abd 2x10 each Supine quad sets 2x10 5" hold Supine SLR 3x10 each Seated heel slide x 5 - 5 " hold L Modalities: Gameready  R knee 51min 38 deg Low compression  [facility] Adult PT Treatment:                                                DATE: 01/24/2022 Therapeutic Exercise: Quad sets x 5 - 5"  hold Supine SLR x 10 each S/L hip abd x 5 each Seated heel slide x 5 - 5 " hold L   PATIENT EDUCATION:  Education details: continue HEP Person educated: Patient Education method: Explanation, Demonstration, and Handouts Education comprehension: verbalized understanding and returned demonstration     HOME EXERCISE PROGRAM: Access Code: [email or web address] URL: [email or web address] Date: 02/05/2022 Prepared by: Octavio Manns  Exercises - Supine Quadricep Sets  - 1 x daily - 7 x weekly - 2 sets - 10 reps - 5 sec hold - Active Straight Leg Raise with Quad Set  - 1 x  daily - 7 x weekly - 2 sets - 10 reps - Sidelying Hip Abduction  - 1 x daily - 7 x weekly - 2 sets - 10 reps - Seated Heel Slide  - 1 x daily - 7 x weekly - 2 sets - 10 reps - 5 sec hold - Seated Hip Abduction with Resistance  - 1 x daily - 7 x weekly - 3 sets - 10 reps - Supine Lower Trunk Rotation  - 1 x daily - 7 x weekly - 3 sets - 10 reps - Supine Bridge  - 1 x daily - 7 x weekly - 3 sets - 10 reps   ASSESSMENT:   CLINICAL IMPRESSION: Pt was able to complete all prescribed exercises with improved tolerance today. Therapy focused on improving LE strength and functional mobility. He is benefiting from skilled PT services, will continue to be seen and progressed as able per POC.      OBJECTIVE IMPAIRMENTS Abnormal gait, decreased activity tolerance, decreased endurance, decreased mobility, difficulty walking, decreased ROM, decreased strength, and pain   ACTIVITY LIMITATIONS carrying, lifting, standing, squatting, stairs, transfers, and locomotion level   PARTICIPATION LIMITATIONS: driving, shopping, community activity, and occupation   [place] and 3+ comorbidities: HTN, DMII, Gout  are also affecting patient's functional outcome.      GOALS: Goals reviewed with patient? No   SHORT TERM GOALS: Target date: 02/14/2022  Pt will be compliant and knowledgeable with initial HEP for improved comfort and carryover Baseline: initial HEP given  Goal status: MET   2.  Pt will self report L LE pain no greater than 5/10 for improved comfort and functional ability Baseline: 7/10 at worst Goal status: ONGOING   LONG TERM GOALS: Target date: 03/21/2022    Pt will improve FOTO function score to no less than 62% as proxy for functional improvement Baseline: 46% function Goal status: INITIAL    2.  Pt will self report L LE pain no greater than 2/10 for improved comfort and functional ability Baseline: 7/10 at worst Goal status: INITIAL   3.  Pt will increase 30 Second Sit  to Stand rep count to no less than 10 reps for improved balance, strength, and functional mobility Baseline: 8 reps - with UE  Goal status: INITIAL    4.  Pt will improve L knee flexion to no less than 105 degrees for improved functional mobility and decreased pain Baseline: 88 deg Goal status: INITIAL   PLAN: PT FREQUENCY: 1-2x/week   PT DURATION: 8 weeks   PLANNED INTERVENTIONS: Therapeutic exercises, Therapeutic activity, Neuromuscular re-education, Balance training, Gait training, Patient/Family education, Self Care, Joint mobilization, Dry Needling, Electrical stimulation, Cryotherapy, Moist heat, Vasopneumatic device, Manual therapy, and Re-evaluation   PLAN FOR NEXT SESSION: assess HEP response, LE strengthening, improve activity tolerance    Ward Chatters, PT 02/07/2022, 9:54 AM

## 2022-02-07 ENCOUNTER — Ambulatory Visit: Payer: BC Managed Care – PPO

## 2022-02-07 DIAGNOSIS — M6281 Muscle weakness (generalized): Secondary | ICD-10-CM | POA: Diagnosis not present

## 2022-02-07 DIAGNOSIS — R2689 Other abnormalities of gait and mobility: Secondary | ICD-10-CM

## 2022-02-07 DIAGNOSIS — R262 Difficulty in walking, not elsewhere classified: Secondary | ICD-10-CM

## 2022-02-12 ENCOUNTER — Ambulatory Visit: Payer: BC Managed Care – PPO

## 2022-02-12 DIAGNOSIS — R2689 Other abnormalities of gait and mobility: Secondary | ICD-10-CM

## 2022-02-12 DIAGNOSIS — M6281 Muscle weakness (generalized): Secondary | ICD-10-CM

## 2022-02-12 DIAGNOSIS — R262 Difficulty in walking, not elsewhere classified: Secondary | ICD-10-CM

## 2022-02-12 NOTE — Therapy (Signed)
OUTPATIENT PHYSICAL THERAPY TREATMENT NOTE   Patient Name: Tony Dean MRN: [id number] DOB:05/01/1960, 61 y.o., male Today's Date: 02/12/2022  PCP: Edwards, Michelle P, NP REFERRING PROVIDER: Edwards, Michelle P, NP  END OF SESSION:   PT End of Session - 02/12/22 0916     Visit Number 5    Number of Visits 17    Date for PT Re-Evaluation 03/21/22    Authorization Type BCBS    PT Start Time 0915    PT Stop Time 0954    PT Time Calculation (min) 39 min    Activity Tolerance Patient tolerated treatment well    Behavior During Therapy WFL for tasks assessed/performed                Past Medical History:  Diagnosis Date   Abnormal electrocardiogram (ECG) (EKG)    Dehydration    Dizziness    DM (diabetes mellitus) (HCC)    ED (erectile dysfunction)    Gout    Hypertension    Lymphangitis of groin    Scrotal pain    Sprain of left wrist    Urinary retention    Past Surgical History:  Procedure Laterality Date   APPENDECTOMY     INGUINAL HERNIA REPAIR  1990's   right   Patient Active Problem List   Diagnosis Date Noted   Right knee pain 12/20/2021   Sepsis (HCC) 12/19/2021   Joint swelling 12/19/2021   Hyperuricemia 05/12/2020   Hypertension 05/12/2020   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   Type 2 diabetes mellitus without complication, without long-term current use of insulin (HCC) 05/12/2020   Non compliance w medication regimen 05/12/2020   Alcoholism (HCC) 05/12/2020   Erectile disorder due to medical condition in male 05/12/2020   History of gout 05/12/2020   Other constipation 05/12/2020   Neuropathy 05/12/2020    REFERRING DIAG: R53.81 (ICD-10-CM) - Physical deconditioning  THERAPY DIAG:  Muscle weakness (generalized)  Other abnormalities of gait and mobility  Difficulty in walking, not elsewhere classified  Rationale for Evaluation and Treatment Rehabilitation  PERTINENT HISTORY: HTN, DMII, Gout  PRECAUTIONS: Fall  SUBJECTIVE:  Pt presents to PT with reports of decreased pain. Has been compliant with HEP with no adverse effect. Pt is ready to begin PT at this time.   PAIN:  Are you having pain?  Yes: NPRS scale: 8/10 Pain location: L posterior thigh, L UE Pain description: cramp Aggravating factors: knee bending, prolonged standing Relieving factors: rest   OBJECTIVE: (objective measures completed at initial evaluation unless otherwise dated)  DIAGNOSTIC FINDINGS:  See imaging   PATIENT SURVEYS:  FOTO: 46% function; 62% predicted   COGNITION:           Overall cognitive status: Within functional limits for tasks assessed                          SENSATION: WFL   POSTURE: rounded shoulders, forward head, and medium body habitus   PALPATION: Very slight TTP to distal L hamstring    LOWER EXTREMITY ROM:   Active ROM Right eval Left eval  Hip flexion      Hip extension      Hip abduction      Hip adduction      Hip internal rotation      Hip external rotation      Knee flexion 101 88  Knee extension      Ankle dorsiflexion      Ankle   plantarflexion      Ankle inversion      Ankle eversion       (Blank rows = not tested)   LOWER EXTREMITY MMT:   MMT Right eval Left eval  Hip flexion 4/5 4/5  Hip extension      Hip abduction 3+/5 3+/5  Hip adduction 4/5 4/5  Hip internal rotation      Hip external rotation      Knee flexion 4/5 3+/5   Knee extension 4/5 4/5  Ankle dorsiflexion      Ankle plantarflexion      Ankle inversion      Ankle eversion       (Blank rows = not tested)   LOWER EXTREMITY SPECIAL TESTS:  DNT   FUNCTIONAL TESTS:  30 Second Sit to Stand: 8 reps - with UE support   GAIT: Distance walked: 25ft and 8 stairs Assistive device utilized: None Level of assistance: Complete Independence Comments: step-to gait on stairs, R LE leading; ambulates with bilateral out-toeing        TODAY'S TREATMENT: [facility] Adult PT Treatment:                                                 DATE: 02/12/2022 Therapeutic Exercise: NuStep lvl 4 UE/LE x 5 min while taking subjective LAQ 2x10 each - 5" hold 2# Seated hamstring curl 3x10 GTB Supine clamshell 3x15 GTB Supine SLR 2x10 each Bridge 2x10 Supine quad sets x 10 5" hold Seated ball squeeze 2x10 - 5" hold STS 2x10 - high table with UE Standing mini squat x 10 - UE support Step up x 10 fwd 8in each Standing hip abd 2x10 12.5#  [facility] Adult PT Treatment:                                                DATE: 02/07/2022 Therapeutic Exercise: NuStep lvl 4 UE/LE x 5 min while taking subjective LAQ 2x10 each - 5" hold Seated hamstring curl 3x10 GTB Supine clamshell 3x15 GTB Supine SLR 3x10 each Supine ball squeeze 2x10 - 5" hold Supine quad sets 2x10 5" hold Seated heel slides 2x10 - 5" hold each STS 3x5 - high table  [facility] Adult PT Treatment:                                                DATE: 02/05/2022 Therapeutic Exercise: NuStep lvl 3 UE/LE x 5 min while taking subjective LAQ 2x10 each - 5" hold Seated heel slide x 10 - 5 " hold  Seated clamshell 3x10 GTB Seated ball squeeze 2x10 - 5" hold Supine quad sets 2x10 5" hold Supine SLR 3x10 each LTR x 10 Bridge 2x10 STS 2x5 - high table  [facility] Adult PT Treatment:                                                DATE: 01/31/2022 Therapeutic Exercise: NuStep lvl 4 UE/LE x 4   min while taking subjective LAQ x 10 each SAQ 2x10 - 5" hold S/L hip abd 2x10 each Supine quad sets 2x10 5" hold Supine SLR 3x10 each Seated heel slide x 5 - 5 " hold L Modalities: Gameready  R knee 10min 38 deg Low compression  [facility] Adult PT Treatment:                                                DATE: 01/24/2022 Therapeutic Exercise: Quad sets x 5 - 5" hold Supine SLR x 10 each S/L hip abd x 5 each Seated heel slide x 5 - 5 " hold L   PATIENT EDUCATION:  Education details: continue HEP Person educated: Patient Education method: Explanation, Demonstration, and  Handouts Education comprehension: verbalized understanding and returned demonstration     HOME EXERCISE PROGRAM: Access Code: [email or web address] URL: [email or web address] Date: 02/05/2022 Prepared by: David Stroup  Exercises - Supine Quadricep Sets  - 1 x daily - 7 x weekly - 2 sets - 10 reps - 5 sec hold - Active Straight Leg Raise with Quad Set  - 1 x daily - 7 x weekly - 2 sets - 10 reps - Sidelying Hip Abduction  - 1 x daily - 7 x weekly - 2 sets - 10 reps - Seated Heel Slide  - 1 x daily - 7 x weekly - 2 sets - 10 reps - 5 sec hold - Seated Hip Abduction with Resistance  - 1 x daily - 7 x weekly - 3 sets - 10 reps - Supine Lower Trunk Rotation  - 1 x daily - 7 x weekly - 3 sets - 10 reps - Supine Bridge  - 1 x daily - 7 x weekly - 3 sets - 10 reps   ASSESSMENT:   CLINICAL IMPRESSION: Pt was able to complete all prescribed exercises with no adverse effect. Therapy focused once again on improving LE strength and mobility.Pt showed improved tolerance to activity today, will continue to progress as able per POC.      OBJECTIVE IMPAIRMENTS Abnormal gait, decreased activity tolerance, decreased endurance, decreased mobility, difficulty walking, decreased ROM, decreased strength, and pain   ACTIVITY LIMITATIONS carrying, lifting, standing, squatting, stairs, transfers, and locomotion level   PARTICIPATION LIMITATIONS: driving, shopping, community activity, and occupation   PERSONAL FACTORS Fitness and 3+ comorbidities: HTN, DMII, Gout  are also affecting patient's functional outcome.      GOALS: Goals reviewed with patient? No   SHORT TERM GOALS: Target date: 02/14/2022  Pt will be compliant and knowledgeable with initial HEP for improved comfort and carryover Baseline: initial HEP given  Goal status: MET   2.  Pt will self report L LE pain no greater than 5/10 for improved comfort and functional ability Baseline: 7/10 at worst Goal status: ONGOING   LONG TERM GOALS:  Target date: 03/21/2022    Pt will improve FOTO function score to no less than 62% as proxy for functional improvement Baseline: 46% function Goal status: INITIAL    2.  Pt will self report L LE pain no greater than 2/10 for improved comfort and functional ability Baseline: 7/10 at worst Goal status: INITIAL   3.  Pt will increase 30 Second Sit to Stand rep count to no less than 10 reps for improved balance, strength, and functional mobility Baseline: 8 reps - with UE    Goal status: INITIAL    4.  Pt will improve L knee flexion to no less than 105 degrees for improved functional mobility and decreased pain Baseline: 88 deg Goal status: INITIAL   PLAN: PT FREQUENCY: 1-2x/week   PT DURATION: 8 weeks   PLANNED INTERVENTIONS: Therapeutic exercises, Therapeutic activity, Neuromuscular re-education, Balance training, Gait training, Patient/Family education, Self Care, Joint mobilization, Dry Needling, Electrical stimulation, Cryotherapy, Moist heat, Vasopneumatic device, Manual therapy, and Re-evaluation   PLAN FOR NEXT SESSION: assess HEP response, LE strengthening, improve activity tolerance    David C Stroup, PT 02/12/2022, 9:54 AM     

## 2022-02-14 ENCOUNTER — Ambulatory Visit: Payer: BC Managed Care – PPO

## 2022-02-14 DIAGNOSIS — M6281 Muscle weakness (generalized): Secondary | ICD-10-CM

## 2022-02-14 DIAGNOSIS — R262 Difficulty in walking, not elsewhere classified: Secondary | ICD-10-CM

## 2022-02-14 DIAGNOSIS — R2689 Other abnormalities of gait and mobility: Secondary | ICD-10-CM

## 2022-02-14 NOTE — Therapy (Signed)
OUTPATIENT PHYSICAL THERAPY TREATMENT NOTE   Patient Name: Tony Dean MRN: [id number] DOB:06/10/1960, 61 y.o., male Today's Date: 02/14/2022  PCP: Tony Perna, NP REFERRING PROVIDER: Kerin Perna, NP  END OF SESSION:   PT End of Session - 02/14/22 1119     Visit Number 6    Number of Visits 17    Date for PT Re-Evaluation 03/21/22    Authorization Type BCBS    PT Start Time 1125    PT Stop Time 1203    PT Time Calculation (min) 38 min    Activity Tolerance Patient tolerated treatment well    Behavior During Therapy WFL for tasks assessed/performed                 Past Medical History:  Diagnosis Date   Abnormal electrocardiogram (ECG) (EKG)    Dehydration    Dizziness    DM (diabetes mellitus) ([place])    ED (erectile dysfunction)    Gout    Hypertension    Lymphangitis of groin    Scrotal pain    Sprain of left wrist    Urinary retention    Past Surgical History:  Procedure Laterality Date   APPENDECTOMY     INGUINAL HERNIA REPAIR  1990's   right   Patient Active Problem List   Diagnosis Date Noted   Right knee pain 12/20/2021   Sepsis ([place]) 12/19/2021   Joint swelling 12/19/2021   Hyperuricemia 05/12/2020   Hypertension 05/12/2020   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   Type 2 diabetes mellitus without complication, without long-term current use of insulin ([place]) 05/12/2020   Non compliance w medication regimen 05/12/2020   Alcoholism ([place]) 05/12/2020   Erectile disorder due to medical condition in male 05/12/2020   History of gout 05/12/2020   Other constipation 05/12/2020   Neuropathy 05/12/2020    REFERRING DIAG: R53.81 (ICD-10-CM) - Physical deconditioning  THERAPY DIAG:  Muscle weakness (generalized)  Other abnormalities of gait and mobility  Difficulty in walking, not elsewhere classified  Rationale for Evaluation and Treatment Rehabilitation  PERTINENT HISTORY: HTN, DMII, Gout  PRECAUTIONS:  Fall  SUBJECTIVE: Pt presents to PT with reports of continued decrease in pain. Has been compliant with HEP with no adverse effect. Is ready to begin PT at this time.  PAIN:  Are you having pain?  Yes: NPRS scale: 7/10 Pain location: L posterior thigh, L UE Pain description: cramp Aggravating factors: knee bending, prolonged standing Relieving factors: rest   OBJECTIVE: (objective measures completed at initial evaluation unless otherwise dated)  DIAGNOSTIC FINDINGS:  See imaging   PATIENT SURVEYS:  FOTO: 46% function; 62% predicted   COGNITION:           Overall cognitive status: Within functional limits for tasks assessed                          SENSATION: WFL   POSTURE: rounded shoulders, forward head, and medium body habitus   PALPATION: Very slight TTP to distal L hamstring    LOWER EXTREMITY ROM:   Active ROM Right eval Left eval  Hip flexion      Hip extension      Hip abduction      Hip adduction      Hip internal rotation      Hip external rotation      Knee flexion 101 88  Knee extension      Ankle dorsiflexion  Ankle plantarflexion      Ankle inversion      Ankle eversion       (Blank rows = not tested)   LOWER EXTREMITY MMT:   MMT Right eval Left eval  Hip flexion 4/5 4/5  Hip extension      Hip abduction 3+/5 3+/5  Hip adduction 4/5 4/5  Hip internal rotation      Hip external rotation      Knee flexion 4/5 3+/5   Knee extension 4/5 4/5  Ankle dorsiflexion      Ankle plantarflexion      Ankle inversion      Ankle eversion       (Blank rows = not tested)   LOWER EXTREMITY SPECIAL TESTS:  DNT   FUNCTIONAL TESTS:  30 Second Sit to Stand: 8 reps - with UE support   GAIT: Distance walked: 74ft and 8 stairs Assistive device utilized: None Level of assistance: Complete Independence Comments: step-to gait on stairs, R LE leading; ambulates with bilateral out-toeing        TODAY'S TREATMENT: [facility] Adult PT Treatment:                                                 DATE: 02/14/2022 Therapeutic Exercise: NuStep lvl 5 UE/LE x 5 min while taking subjective Seated hamstring curl 2x10 35# Seated knee ext 2x10 10# STS 3x10 - no UE support - high table Supine clamshell 3x10 BTB Supine SLR 2x10 each 2# Bridge 2x10 Supine quad sets x 10 5" hold Step up 2x10 fwd 8in each Standing hip abd 2x10 12.5#  [facility] Adult PT Treatment:                                                DATE: 02/12/2022 Therapeutic Exercise: NuStep lvl 4 UE/LE x 5 min while taking subjective LAQ 2x10 each - 5" hold 2# Seated hamstring curl 3x10 GTB Supine clamshell 3x15 GTB Supine SLR 2x10 each Bridge 2x10 Supine quad sets x 10 5" hold Seated ball squeeze 2x10 - 5" hold STS 2x10 - high table with UE Standing mini squat x 10 - UE support Step up x 10 fwd 8in each Standing hip abd 2x10 12.5#  [facility] Adult PT Treatment:                                                DATE: 02/07/2022 Therapeutic Exercise: NuStep lvl 4 UE/LE x 5 min while taking subjective LAQ 2x10 each - 5" hold Seated hamstring curl 3x10 GTB Supine clamshell 3x15 GTB Supine SLR 3x10 each Supine ball squeeze 2x10 - 5" hold Supine quad sets 2x10 5" hold Seated heel slides 2x10 - 5" hold each STS 3x5 - high table  [facility] Adult PT Treatment:                                                DATE: 02/05/2022 Therapeutic Exercise: NuStep lvl 3 UE/LE  x 5 min while taking subjective LAQ 2x10 each - 5" hold Seated heel slide x 10 - 5 " hold  Seated clamshell 3x10 GTB Seated ball squeeze 2x10 - 5" hold Supine quad sets 2x10 5" hold Supine SLR 3x10 each LTR x 10 Bridge 2x10 STS 2x5 - high table  [facility] Adult PT Treatment:                                                DATE: 01/31/2022 Therapeutic Exercise: NuStep lvl 4 UE/LE x 4 min while taking subjective LAQ x 10 each SAQ 2x10 - 5" hold S/L hip abd 2x10 each Supine quad sets 2x10 5" hold Supine SLR 3x10 each Seated heel slide  x 5 - 5 " hold L Modalities: Gameready  R knee 60min 38 deg Low compression  [facility] Adult PT Treatment:                                                DATE: 01/24/2022 Therapeutic Exercise: Quad sets x 5 - 5" hold Supine SLR x 10 each S/L hip abd x 5 each Seated heel slide x 5 - 5 " hold L   PATIENT EDUCATION:  Education details: continue HEP Person educated: Patient Education method: Explanation, Demonstration, and Handouts Education comprehension: verbalized understanding and returned demonstration     HOME EXERCISE PROGRAM: Access Code: [email or web address] URL: [email or web address] Date: 02/05/2022 Prepared by: Octavio Manns  Exercises - Supine Quadricep Sets  - 1 x daily - 7 x weekly - 2 sets - 10 reps - 5 sec hold - Active Straight Leg Raise with Quad Set  - 1 x daily - 7 x weekly - 2 sets - 10 reps - Sidelying Hip Abduction  - 1 x daily - 7 x weekly - 2 sets - 10 reps - Seated Heel Slide  - 1 x daily - 7 x weekly - 2 sets - 10 reps - 5 sec hold - Seated Hip Abduction with Resistance  - 1 x daily - 7 x weekly - 3 sets - 10 reps - Supine Lower Trunk Rotation  - 1 x daily - 7 x weekly - 3 sets - 10 reps - Supine Bridge  - 1 x daily - 7 x weekly - 3 sets - 10 reps   ASSESSMENT:   CLINICAL IMPRESSION: Pt was able to complete all prescribed exercises with no adverse effect and continued improvement in activity tolerance. Therapy focused once again on improving LE strength and mobility with progression of exercises today. Pt showed improved tolerance to activity today, will continue to progress as able per POC.      OBJECTIVE IMPAIRMENTS Abnormal gait, decreased activity tolerance, decreased endurance, decreased mobility, difficulty walking, decreased ROM, decreased strength, and pain   ACTIVITY LIMITATIONS carrying, lifting, standing, squatting, stairs, transfers, and locomotion level   PARTICIPATION LIMITATIONS: driving, shopping, community activity, and occupation    Todd and 3+ comorbidities: HTN, DMII, Gout  are also affecting patient's functional outcome.      GOALS: Goals reviewed with patient? No   SHORT TERM GOALS: Target date: 02/14/2022  Pt will be compliant and knowledgeable with initial HEP for improved comfort and carryover Baseline: initial HEP given  Goal status: MET   2.  Pt will self report L LE pain no greater than 5/10 for improved comfort and functional ability Baseline: 7/10 at worst Goal status: ONGOING   LONG TERM GOALS: Target date: 03/21/2022    Pt will improve FOTO function score to no less than 62% as proxy for functional improvement Baseline: 46% function Goal status: INITIAL    2.  Pt will self report L LE pain no greater than 2/10 for improved comfort and functional ability Baseline: 7/10 at worst Goal status: INITIAL   3.  Pt will increase 30 Second Sit to Stand rep count to no less than 10 reps for improved balance, strength, and functional mobility Baseline: 8 reps - with UE  Goal status: INITIAL    4.  Pt will improve L knee flexion to no less than 105 degrees for improved functional mobility and decreased pain Baseline: 88 deg Goal status: INITIAL   PLAN: PT FREQUENCY: 1-2x/week   PT DURATION: 8 weeks   PLANNED INTERVENTIONS: Therapeutic exercises, Therapeutic activity, Neuromuscular re-education, Balance training, Gait training, Patient/Family education, Self Care, Joint mobilization, Dry Needling, Electrical stimulation, Cryotherapy, Moist heat, Vasopneumatic device, Manual therapy, and Re-evaluation   PLAN FOR NEXT SESSION: assess HEP response, LE strengthening, improve activity tolerance    Ward Chatters, PT 02/14/2022, 12:04 PM

## 2022-02-19 ENCOUNTER — Ambulatory Visit: Payer: BC Managed Care – PPO

## 2022-02-19 DIAGNOSIS — R2689 Other abnormalities of gait and mobility: Secondary | ICD-10-CM

## 2022-02-19 DIAGNOSIS — M6281 Muscle weakness (generalized): Secondary | ICD-10-CM

## 2022-02-19 DIAGNOSIS — R262 Difficulty in walking, not elsewhere classified: Secondary | ICD-10-CM

## 2022-02-19 NOTE — Therapy (Signed)
OUTPATIENT PHYSICAL THERAPY TREATMENT NOTE   Patient Name: Tony Dean MRN: [id number] DOB:1960/05/08, 61 y.o., male Today's Date: 02/19/2022  PCP: Kerin Perna, NP REFERRING PROVIDER: Kerin Perna, NP  END OF SESSION:   PT End of Session - 02/19/22 1114     Visit Number 7    Number of Visits 17    Date for PT Re-Evaluation 03/21/22    Authorization Type BCBS    PT Start Time 1130    PT Stop Time 1208    PT Time Calculation (min) 38 min    Activity Tolerance Patient tolerated treatment well    Behavior During Therapy WFL for tasks assessed/performed                  Past Medical History:  Diagnosis Date   Abnormal electrocardiogram (ECG) (EKG)    Dehydration    Dizziness    DM (diabetes mellitus) ([place])    ED (erectile dysfunction)    Gout    Hypertension    Lymphangitis of groin    Scrotal pain    Sprain of left wrist    Urinary retention    Past Surgical History:  Procedure Laterality Date   APPENDECTOMY     INGUINAL HERNIA REPAIR  1990's   right   Patient Active Problem List   Diagnosis Date Noted   Right knee pain 12/20/2021   Sepsis ([place]) 12/19/2021   Joint swelling 12/19/2021   Hyperuricemia 05/12/2020   Hypertension 05/12/2020   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   Type 2 diabetes mellitus without complication, without long-term current use of insulin ([place]) 05/12/2020   Non compliance w medication regimen 05/12/2020   Alcoholism ([place]) 05/12/2020   Erectile disorder due to medical condition in male 05/12/2020   History of gout 05/12/2020   Other constipation 05/12/2020   Neuropathy 05/12/2020    REFERRING DIAG: R53.81 (ICD-10-CM) - Physical deconditioning  THERAPY DIAG:  Muscle weakness (generalized)  Other abnormalities of gait and mobility  Difficulty in walking, not elsewhere classified  Rationale for Evaluation and Treatment Rehabilitation  PERTINENT HISTORY: HTN, DMII, Gout  PRECAUTIONS:  Fall  SUBJECTIVE: Pt presents to PT with continued decrease in pain in L LE. Pt has been compliant with HEP with no adverse effect. Pt is ready to begin PT at this time.   PAIN:  Are you having pain?  Yes: NPRS scale: 4/10 Pain location: L posterior thigh, L UE Pain description: cramp Aggravating factors: knee bending, prolonged standing Relieving factors: rest   OBJECTIVE: (objective measures completed at initial evaluation unless otherwise dated)  DIAGNOSTIC FINDINGS:  See imaging   PATIENT SURVEYS:  FOTO: 46% function; 62% predicted   COGNITION:           Overall cognitive status: Within functional limits for tasks assessed                          SENSATION: WFL   POSTURE: rounded shoulders, forward head, and medium body habitus   PALPATION: Very slight TTP to distal L hamstring    LOWER EXTREMITY ROM:   Active ROM Right eval Left eval  Hip flexion      Hip extension      Hip abduction      Hip adduction      Hip internal rotation      Hip external rotation      Knee flexion 101 88  Knee extension      Ankle dorsiflexion  Ankle plantarflexion      Ankle inversion      Ankle eversion       (Blank rows = not tested)   LOWER EXTREMITY MMT:   MMT Right eval Left eval  Hip flexion 4/5 4/5  Hip extension      Hip abduction 3+/5 3+/5  Hip adduction 4/5 4/5  Hip internal rotation      Hip external rotation      Knee flexion 4/5 3+/5   Knee extension 4/5 4/5  Ankle dorsiflexion      Ankle plantarflexion      Ankle inversion      Ankle eversion       (Blank rows = not tested)   LOWER EXTREMITY SPECIAL TESTS:  DNT   FUNCTIONAL TESTS:  30 Second Sit to Stand: 8 reps - with UE support   GAIT: Distance walked: 39f and 8 stairs Assistive device utilized: None Level of assistance: Complete Independence Comments: step-to gait on stairs, R LE leading; ambulates with bilateral out-toeing        TODAY'S TREATMENT: [facility] Adult PT Treatment:                                                 DATE: 02/19/2022 Therapeutic Exercise: NuStep lvl 5 UE/LE x 5 min while taking subjective Seated hamstring curl 2x10 35# Seated knee ext 2x10 10# STS 3x10 - no UE support - high table Supine quad sets x 10 5" hold Supine clamshell 2x15 Black TB Supine SLR 2x10 each 2# Bridge 2x15 Seated heel slide x 10 - 5" hold each Step up 2x10 fwd 8in each Standing hip abd 2x10 25# Leg press 2x10 35# Standing mini squat 2x10 - UE support  [facility] Adult PT Treatment:                                                DATE: 02/14/2022 Therapeutic Exercise: NuStep lvl 5 UE/LE x 5 min while taking subjective Seated hamstring curl 2x10 35# Seated knee ext 2x10 10# STS 3x10 - no UE support - high table Supine clamshell 3x10 BTB Supine SLR 2x10 each 2# Bridge 2x10 Supine quad sets x 10 5" hold Step up 2x10 fwd 8in each Standing hip abd 2x10 12.5#  [facility] Adult PT Treatment:                                                DATE: 02/12/2022 Therapeutic Exercise: NuStep lvl 4 UE/LE x 5 min while taking subjective LAQ 2x10 each - 5" hold 2# Seated hamstring curl 3x10 GTB Supine clamshell 3x15 GTB Supine SLR 2x10 each Bridge 2x10 Supine quad sets x 10 5" hold Seated ball squeeze 2x10 - 5" hold STS 2x10 - high table with UE Standing mini squat x 10 - UE support Step up x 10 fwd 8in each Standing hip abd 2x10 12.5#  PATIENT EDUCATION:  Education details: continue HEP Person educated: Patient Education method: Explanation, Demonstration, and Handouts Education comprehension: verbalized understanding and returned demonstration     HOME EXERCISE PROGRAM: Access Code: [email or web address] URL: [email or web address]  Date: 02/05/2022 Prepared by: Octavio Manns  Exercises - Supine Quadricep Sets  - 1 x daily - 7 x weekly - 2 sets - 10 reps - 5 sec hold - Active Straight Leg Raise with Quad Set  - 1 x daily - 7 x weekly - 2 sets - 10 reps - Sidelying Hip  Abduction  - 1 x daily - 7 x weekly - 2 sets - 10 reps - Seated Heel Slide  - 1 x daily - 7 x weekly - 2 sets - 10 reps - 5 sec hold - Seated Hip Abduction with Resistance  - 1 x daily - 7 x weekly - 3 sets - 10 reps - Supine Lower Trunk Rotation  - 1 x daily - 7 x weekly - 3 sets - 10 reps - Supine Bridge  - 1 x daily - 7 x weekly - 3 sets - 10 reps   ASSESSMENT:   CLINICAL IMPRESSION: Pt was able to complete all prescribed exercises with no adverse effect or increase in pain.Therapy focused once again on improving LE strength and mobility with progression of exercises today. Pt continued to show improved tolerance to activity today, will continue to progress as able per POC.      OBJECTIVE IMPAIRMENTS Abnormal gait, decreased activity tolerance, decreased endurance, decreased mobility, difficulty walking, decreased ROM, decreased strength, and pain   ACTIVITY LIMITATIONS carrying, lifting, standing, squatting, stairs, transfers, and locomotion level   PARTICIPATION LIMITATIONS: driving, shopping, community activity, and occupation   [place] and 3+ comorbidities: HTN, DMII, Gout  are also affecting patient's functional outcome.      GOALS: Goals reviewed with patient? No   SHORT TERM GOALS: Target date: 02/14/2022  Pt will be compliant and knowledgeable with initial HEP for improved comfort and carryover Baseline: initial HEP given  Goal status: MET   2.  Pt will self report L LE pain no greater than 5/10 for improved comfort and functional ability Baseline: 7/10 at worst Goal status: ONGOING   LONG TERM GOALS: Target date: 03/21/2022    Pt will improve FOTO function score to no less than 62% as proxy for functional improvement Baseline: 46% function Goal status: INITIAL    2.  Pt will self report L LE pain no greater than 2/10 for improved comfort and functional ability Baseline: 7/10 at worst Goal status: INITIAL   3.  Pt will increase 30 Second Sit to  Stand rep count to no less than 10 reps for improved balance, strength, and functional mobility Baseline: 8 reps - with UE  Goal status: INITIAL    4.  Pt will improve L knee flexion to no less than 105 degrees for improved functional mobility and decreased pain Baseline: 88 deg Goal status: INITIAL   PLAN: PT FREQUENCY: 1-2x/week   PT DURATION: 8 weeks   PLANNED INTERVENTIONS: Therapeutic exercises, Therapeutic activity, Neuromuscular re-education, Balance training, Gait training, Patient/Family education, Self Care, Joint mobilization, Dry Needling, Electrical stimulation, Cryotherapy, Moist heat, Vasopneumatic device, Manual therapy, and Re-evaluation   PLAN FOR NEXT SESSION: assess HEP response, LE strengthening, improve activity tolerance    Ward Chatters, PT 02/19/2022, 12:11 PM

## 2022-02-21 ENCOUNTER — Ambulatory Visit: Payer: BC Managed Care – PPO | Attending: Primary Care

## 2022-02-21 DIAGNOSIS — R262 Difficulty in walking, not elsewhere classified: Secondary | ICD-10-CM | POA: Insufficient documentation

## 2022-02-21 DIAGNOSIS — R2689 Other abnormalities of gait and mobility: Secondary | ICD-10-CM | POA: Diagnosis present

## 2022-02-21 DIAGNOSIS — M6281 Muscle weakness (generalized): Secondary | ICD-10-CM | POA: Diagnosis not present

## 2022-02-21 NOTE — Therapy (Signed)
OUTPATIENT PHYSICAL THERAPY TREATMENT NOTE/DISCHARGE  PHYSICAL THERAPY DISCHARGE SUMMARY  Visits from Start of Care: 8  Current functional level related to goals / functional outcomes: See goals and objective   Remaining deficits: See goals and objective   Education / Equipment: HEP   Patient agrees to discharge. Patient goals were  mostly met . Patient is being discharged due to being pleased with the current functional level.   Patient Name: Tony Dean MRN: [id number] DOB:[birth year], 61 y.o., male Today's Date: 02/21/2022  PCP: Kerin Perna, NP REFERRING PROVIDER: Kerin Perna, NP  END OF SESSION:   PT End of Session - 02/21/22 1126     Visit Number 8    Number of Visits 17    Date for PT Re-Evaluation 03/21/22    Authorization Type BCBS    PT Start Time 1130    PT Stop Time 6803    PT Time Calculation (min) 26 min    Activity Tolerance Patient tolerated treatment well    Behavior During Therapy WFL for tasks assessed/performed                   Past Medical History:  Diagnosis Date   Abnormal electrocardiogram (ECG) (EKG)    Dehydration    Dizziness    DM (diabetes mellitus) ([place])    ED (erectile dysfunction)    Gout    Hypertension    Lymphangitis of groin    Scrotal pain    Sprain of left wrist    Urinary retention    Past Surgical History:  Procedure Laterality Date   APPENDECTOMY     INGUINAL HERNIA REPAIR  1990's   right   Patient Active Problem List   Diagnosis Date Noted   Right knee pain 12/20/2021   Sepsis ([place]) 12/19/2021   Joint swelling 12/19/2021   Hyperuricemia 05/12/2020   Hypertension 05/12/2020   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   Type 2 diabetes mellitus without complication, without long-term current use of insulin ([place]) 05/12/2020   Non compliance w medication regimen 05/12/2020   Alcoholism ([place]) 05/12/2020   Erectile disorder due to medical condition in male 05/12/2020   History of gout  05/12/2020   Other constipation 05/12/2020   Neuropathy 05/12/2020    REFERRING DIAG: R53.81 (ICD-10-CM) - Physical deconditioning  THERAPY DIAG:  Muscle weakness (generalized)  Other abnormalities of gait and mobility  Difficulty in walking, not elsewhere classified  Rationale for Evaluation and Treatment Rehabilitation  PERTINENT HISTORY: HTN, DMII, Gout  PRECAUTIONS: Fall  SUBJECTIVE: Pt presents to PT with reports of continued decrease in pain. Has been compliant with HEP with no adverse effect. Is ready to begin PT at this time.   PAIN:  Are you having pain?  Yes: NPRS scale: 4/10 Pain location: L posterior thigh, L UE Pain description: cramp Aggravating factors: knee bending, prolonged standing Relieving factors: rest   OBJECTIVE: (objective measures completed at initial evaluation unless otherwise dated)  DIAGNOSTIC FINDINGS:  See imaging   PATIENT SURVEYS:  FOTO: 60% function; 62% predicted   COGNITION:           Overall cognitive status: Within functional limits for tasks assessed                          SENSATION: WFL   POSTURE: rounded shoulders, forward head, and medium body habitus   PALPATION: Very slight TTP to distal L hamstring    LOWER EXTREMITY ROM:   Active  ROM Right eval Left eval Right  02/21/2022 Left 02/21/2022  Hip flexion        Hip extension        Hip abduction        Hip adduction        Hip internal rotation        Hip external rotation        Knee flexion 101 88 106 105  Knee extension        Ankle dorsiflexion        Ankle plantarflexion        Ankle inversion        Ankle eversion         (Blank rows = not tested)   LOWER EXTREMITY MMT:   MMT Right eval Left eval  Hip flexion 4/5 4/5  Hip extension      Hip abduction 3+/5 3+/5  Hip adduction 4/5 4/5  Hip internal rotation      Hip external rotation      Knee flexion 4/5 3+/5   Knee extension 4/5 4/5  Ankle dorsiflexion      Ankle plantarflexion       Ankle inversion      Ankle eversion       (Blank rows = not tested)   LOWER EXTREMITY SPECIAL TESTS:  DNT   FUNCTIONAL TESTS:  30 Second Sit to Stand: 12 reps - with UE support   GAIT: Distance walked: 40f and 8 stairs Assistive device utilized: None Level of assistance: Complete Independence Comments: step-to gait on stairs, R LE leading; ambulates with bilateral out-toeing        TODAY'S TREATMENT: [facility] Adult PT Treatment:                                                DATE: 02/21/2022 Therapeutic Exercise: STS 2x10 - no UE support - high table Supine quad sets x 10 5" hold Supine clamshell 2x15 Black TB Supine SLR 2x15 each Bridge 2x15 S/L hip abd x 10 each Seated heel slide x 10 - 5" hold each Standing hip abd 2x10   [facility] Adult PT Treatment:                                                DATE: 02/19/2022 Therapeutic Exercise: NuStep lvl 5 UE/LE x 5 min while taking subjective Seated hamstring curl 2x10 35# Seated knee ext 2x10 10# STS 3x10 - no UE support - high table Supine quad sets x 10 5" hold Supine clamshell 2x15 Black TB Supine SLR 2x10 each 2# Bridge 2x15 Seated heel slide x 10 - 5" hold each Step up 2x10 fwd 8in each Standing hip abd 2x10 25# Leg press 2x10 35# Standing mini squat 2x10 - UE support  [facility] Adult PT Treatment:                                                DATE: 02/14/2022 Therapeutic Exercise: NuStep lvl 5 UE/LE x 5 min while taking subjective Seated hamstring curl 2x10 35# Seated knee ext 2x10 10# STS 3x10 -  no UE support - high table Supine clamshell 3x10 BTB Supine SLR 2x10 each 2# Bridge 2x10 Supine quad sets x 10 5" hold Step up 2x10 fwd 8in each Standing hip abd 2x10 12.5#  [facility] Adult PT Treatment:                                                DATE: 02/12/2022 Therapeutic Exercise: NuStep lvl 4 UE/LE x 5 min while taking subjective LAQ 2x10 each - 5" hold 2# Seated hamstring curl 3x10 GTB Supine clamshell 3x15  GTB Supine SLR 2x10 each Bridge 2x10 Supine quad sets x 10 5" hold Seated ball squeeze 2x10 - 5" hold STS 2x10 - high table with UE Standing mini squat x 10 - UE support Step up x 10 fwd 8in each Standing hip abd 2x10 12.5#  PATIENT EDUCATION:  Education details: continue HEP Person educated: Patient Education method: Explanation, Demonstration, and Handouts Education comprehension: verbalized understanding and returned demonstration     HOME EXERCISE PROGRAM: Access Code: [email or web address] URL: [email or web address] Date: 02/21/2022 Prepared by: Octavio Manns  Exercises - Supine Bridge  - 3-4 x weekly - 3 sets - 15 reps - Supine Quadricep Sets  - 3-4 x weekly - 2 sets - 10 reps - 5 sec hold - Active Straight Leg Raise with Quad Set  - 3-4 x weekly - 2 sets - 15 reps - Sidelying Hip Abduction  - 3-4 x weekly - 2 sets - 10 reps - Seated Heel Slide  - 3-4 x weekly - 2 sets - 10 reps - 5 sec hold - Sit to Stand Without Arm Support  - 3-4 x weekly - 3 sets - 10 reps - Standing Hip Abduction with Resistance at Ankles and Counter Support  - 3-4 x weekly - 3 sets - 10 reps   ASSESSMENT:   CLINICAL IMPRESSION: Pt was able to complete all prescribed exercises and demonstrated knowledge of HEP with no adverse effect. He has progressed very well with therapy, showing improved functional mobility and knee ROM. He greatly improved functional mobility assessed via 30" STS and showed sharp subjective improvement in functional ability via FOTO. Should continue to improve with HEP compliance and will continue to be seen and progressed as able per POC.      OBJECTIVE IMPAIRMENTS Abnormal gait, decreased activity tolerance, decreased endurance, decreased mobility, difficulty walking, decreased ROM, decreased strength, and pain   ACTIVITY LIMITATIONS carrying, lifting, standing, squatting, stairs, transfers, and locomotion level   PARTICIPATION LIMITATIONS: driving, shopping, community  activity, and occupation   [place] and 3+ comorbidities: HTN, DMII, Gout  are also affecting patient's functional outcome.      GOALS: Goals reviewed with patient? No   SHORT TERM GOALS: Target date: 02/14/2022  Pt will be compliant and knowledgeable with initial HEP for improved comfort and carryover Baseline: initial HEP given  Goal status: MET   2.  Pt will self report L LE pain no greater than 5/10 for improved comfort and functional ability Baseline: 7/10 at worst Goal status: MET   LONG TERM GOALS: Target date: 03/21/2022    Pt will improve FOTO function score to no less than 62% as proxy for functional improvement Baseline: 46% function 02/21/2022: 60% function Goal status: MOSTLY MET   2.  Pt will self report L LE pain no greater than 2/10 for  improved comfort and functional ability Baseline: 7/10 at worst Goal status: MET   3.  Pt will increase 30 Second Sit to Stand rep count to no less than 10 reps for improved balance, strength, and functional mobility Baseline: 8 reps - with UE  02/21/2022: 12 reps - with UE Goal status: MET   4.  Pt will improve L knee flexion to no less than 105 degrees for improved functional mobility and decreased pain Baseline: 88 02/21/2022: 105 Goal status: MET   PLAN: PT FREQUENCY: 1-2x/week   PT DURATION: 8 weeks   PLANNED INTERVENTIONS: Therapeutic exercises, Therapeutic activity, Neuromuscular re-education, Balance training, Gait training, Patient/Family education, Self Care, Joint mobilization, Dry Needling, Electrical stimulation, Cryotherapy, Moist heat, Vasopneumatic device, Manual therapy, and Re-evaluation   PLAN FOR NEXT SESSION: assess HEP response, LE strengthening, improve activity tolerance    Ward Chatters, PT 02/21/2022, 12:03 PM

## 2022-03-08 ENCOUNTER — Other Ambulatory Visit: Payer: Self-pay | Admitting: Nurse Practitioner

## 2022-03-08 DIAGNOSIS — M4727 Other spondylosis with radiculopathy, lumbosacral region: Secondary | ICD-10-CM

## 2022-03-19 ENCOUNTER — Encounter (INDEPENDENT_AMBULATORY_CARE_PROVIDER_SITE_OTHER): Payer: Self-pay | Admitting: Primary Care

## 2022-03-19 ENCOUNTER — Other Ambulatory Visit (INDEPENDENT_AMBULATORY_CARE_PROVIDER_SITE_OTHER): Payer: Self-pay | Admitting: Primary Care

## 2022-03-19 ENCOUNTER — Ambulatory Visit (INDEPENDENT_AMBULATORY_CARE_PROVIDER_SITE_OTHER): Payer: BC Managed Care – PPO | Admitting: Primary Care

## 2022-03-19 ENCOUNTER — Other Ambulatory Visit (INDEPENDENT_AMBULATORY_CARE_PROVIDER_SITE_OTHER): Payer: Self-pay | Admitting: Internal Medicine

## 2022-03-19 VITALS — BP 122/79 | HR 74 | Resp 16 | Ht 70.0 in | Wt 193.6 lb

## 2022-03-19 DIAGNOSIS — E119 Type 2 diabetes mellitus without complications: Secondary | ICD-10-CM | POA: Diagnosis not present

## 2022-03-19 DIAGNOSIS — M542 Cervicalgia: Secondary | ICD-10-CM | POA: Diagnosis not present

## 2022-03-19 DIAGNOSIS — M545 Low back pain, unspecified: Secondary | ICD-10-CM

## 2022-03-19 DIAGNOSIS — Z1211 Encounter for screening for malignant neoplasm of colon: Secondary | ICD-10-CM | POA: Diagnosis not present

## 2022-03-19 DIAGNOSIS — Z8739 Personal history of other diseases of the musculoskeletal system and connective tissue: Secondary | ICD-10-CM

## 2022-03-19 DIAGNOSIS — E663 Overweight: Secondary | ICD-10-CM

## 2022-03-19 NOTE — Progress Notes (Signed)
Established Patient Office Visit  Subjective   Patient ID: Tony Dean    DOB: 1960-05-02  Age: 61 y.o. MRN: [id number]  HPI  [name] is a 61 year old male who presents today for the management of type 2 diabetes-he denies polyuria, polydipsia, polyphagia or vision changes.  Also, hypertension- Patient has No headache, No chest pain, No abdominal pain - No Nausea, No new weakness tingling or numbness, No Cough - shortness of breath.  He has been out of work since September 2023 due to cervical and lower lumbar area.  Patient feels as though 1 provider says 1 thing another provider says something else and nothing seems to be done.  Writer has completed FMLA short-term and referred to physical therapy. Patient has recently seen-[facility] -patient and family was did not need to go back to work until MRI was completed.  Follow-up appointment with her is April 03, 2022-removed patient from work until this scheduled appointment.  At that time of appointment there is questionable concerns about his ability to return to work due to cervical and lumbar this should be determined by the specialist seeing the patient for spine and pain.  Patient states his pain on a normal basis cervical and lumbar is 9 out of 10 aggravating factors include bending, sitting or standing for.'s of time, going up steps or trying to lift something as simple as groceries. Active Ambulatory Problems    Diagnosis Date Noted   Hyperuricemia 05/12/2020   Hypertension 05/12/2020   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   Type 2 diabetes mellitus without complication, without long-term current use of insulin ([facility]) 05/12/2020   Non compliance w medication regimen 05/12/2020   Alcoholism ([facility]) 05/12/2020   Erectile disorder due to medical condition in male 05/12/2020   History of gout 05/12/2020   Other constipation 05/12/2020   Neuropathy 05/12/2020   Sepsis ([facility]) 12/19/2021   Joint swelling  12/19/2021   Right knee pain 12/20/2021   Resolved Ambulatory Problems    Diagnosis Date Noted   No Resolved Ambulatory Problems   Past Medical History:  Diagnosis Date   Abnormal electrocardiogram (ECG) (EKG)    Dehydration    Dizziness    DM (diabetes mellitus) ([facility])    ED (erectile dysfunction)    Gout    Lymphangitis of groin    Scrotal pain    Sprain of left wrist    Urinary retention      ROS  Comprehensive ROS Pertinent positive and negative noted in HPI  Objective:  Blood Pressure 122/79   Pulse 74   Respiration 16   Height 5\' 10"  (1.778 m)   Weight 193 lb 9.6 oz (87.8 kg)   Oxygen Saturation 99%   Body Mass Index 27.78 kg/m   Physical Exam  General: No apparent distress. Eyes: Extraocular eye movements intact, pupils equal and round. Neck: Supple, trachea midline. Thyroid: No enlargement, mobile without fixation, no tenderness. Cardiovascular: Regular rhythm and rate, no murmur, normal radial pulses. Respiratory: Normal respiratory effort, clear to auscultation. Gastrointestinal: Normal pitch active bowel sounds, nontender abdomen without distention or appreciable hepatomegaly. Musculoskeletal: Normal muscle tone, no tenderness on palpation of tibia, no excessive thoracic kyphosis. Skin: Appropriate warmth, no visible rash. Mental status: Alert, conversant, speech clear Hematologic/lymphatic: No cervical adenopathy, no visible ecchymoses.   The ASCVD Risk score (Arnett DK, et al., 2019) failed to calculate for the following reasons:   The systolic blood pressure is missing   Cannot find a previous  HDL lab   Cannot find a previous total cholesterol lab Assessment & Plan:  Diagnoses and all orders for this visit:  Type 2 diabetes mellitus without complication, without long-term current use of insulin ([facility]) - educated on lifestyle modifications, including but not limited to diet choices and adding exercise to daily routine.   A1C- 6.7 -     Microalbumin /  creatinine urine ratio -     Ambulatory referral to Ophthalmology  Colon cancer screening -     Ambulatory referral to Gastroenterology  Cervical pain (neck) -     Ambulatory referral to Physical Therapy  Lumbar pain -     Ambulatory referral to Physical Therapy  Overweight (BMI 25.0-29.9) Discussed diet and exercise for person with BMI >25. Instructed: You must burn more calories than you eat. Losing 5 percent of your body weight should be considered a success. In the longer term, losing more than 15 percent of your body weight and staying at this weight is an extremely good result. However, keep in mind that even losing 5 percent of your body weight leads to important health benefits, so try not to get discouraged if you're not able to lose more than this. Will recheck weight in 3-6 months.     Grayce Sessions, NP

## 2022-03-20 LAB — MICROALBUMIN / CREATININE URINE RATIO
Creatinine, Urine: 245.4 mg/dL
Microalb/Creat Ratio: 31 mg/g creat — ABNORMAL HIGH (ref 0–29)
Microalbumin, Urine: 77.3 ug/mL

## 2022-03-20 MED ORDER — ALLOPURINOL 100 MG PO TABS: 100.0000 mg | ORAL_TABLET | Freq: Every day | ORAL | 1 refills | Status: DC

## 2022-03-20 MED ORDER — METFORMIN HCL ER 500 MG PO TB24: 500.0000 mg | ORAL_TABLET | Freq: Every day | ORAL | 0 refills | Status: DC

## 2022-03-22 ENCOUNTER — Ambulatory Visit: Payer: BC Managed Care – PPO

## 2022-03-23 ENCOUNTER — Ambulatory Visit: Payer: BC Managed Care – PPO | Attending: Primary Care

## 2022-03-23 ENCOUNTER — Other Ambulatory Visit: Payer: Self-pay

## 2022-03-23 DIAGNOSIS — M5459 Other low back pain: Secondary | ICD-10-CM | POA: Diagnosis present

## 2022-03-23 DIAGNOSIS — R293 Abnormal posture: Secondary | ICD-10-CM | POA: Diagnosis present

## 2022-03-23 DIAGNOSIS — R252 Cramp and spasm: Secondary | ICD-10-CM | POA: Diagnosis present

## 2022-03-23 DIAGNOSIS — R262 Difficulty in walking, not elsewhere classified: Secondary | ICD-10-CM | POA: Insufficient documentation

## 2022-03-23 DIAGNOSIS — R2689 Other abnormalities of gait and mobility: Secondary | ICD-10-CM | POA: Insufficient documentation

## 2022-03-23 DIAGNOSIS — M545 Low back pain, unspecified: Secondary | ICD-10-CM | POA: Insufficient documentation

## 2022-03-23 DIAGNOSIS — M542 Cervicalgia: Secondary | ICD-10-CM | POA: Diagnosis present

## 2022-03-23 DIAGNOSIS — M6281 Muscle weakness (generalized): Secondary | ICD-10-CM | POA: Insufficient documentation

## 2022-03-23 NOTE — Therapy (Signed)
OUTPATIENT PHYSICAL THERAPY THORACOLUMBAR EVALUATION   Patient Name: Tony Dean MRN: [id number] DOB:[birth year], 61 y.o., male Today's Date: 03/23/2022  END OF SESSION:  PT End of Session - 03/23/22 1149     Visit Number 1    Date for PT Re-Evaluation 05/11/22    Authorization Type BCBS    Progress Note Due on Visit 10    PT Start Time 1130    PT Stop Time 1215    PT Time Calculation (min) 45 min    Activity Tolerance Patient tolerated treatment well    Behavior During Therapy WFL for tasks assessed/performed             Past Medical History:  Diagnosis Date   Abnormal electrocardiogram (ECG) (EKG)    Dehydration    Dizziness    DM (diabetes mellitus) (HCC)    ED (erectile dysfunction)    Gout    Hypertension    Lymphangitis of groin    Scrotal pain    Sprain of left wrist    Urinary retention    Past Surgical History:  Procedure Laterality Date   APPENDECTOMY     INGUINAL HERNIA REPAIR  1990's   right   Patient Active Problem List   Diagnosis Date Noted   Right knee pain 12/20/2021   Sepsis (HCC) 12/19/2021   Joint swelling 12/19/2021   Hyperuricemia 05/12/2020   Hypertension 05/12/2020   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   Type 2 diabetes mellitus without complication, without long-term current use of insulin (HCC) 05/12/2020   Non compliance w medication regimen 05/12/2020   Alcoholism (HCC) 05/12/2020   Erectile disorder due to medical condition in male 05/12/2020   History of gout 05/12/2020   Other constipation 05/12/2020   Neuropathy 05/12/2020    PCP: Grayce Sessions, NP  REFERRING PROVIDER: Grayce Sessions, NP  REFERRING DIAG: M54.2 (ICD-10-CM) - Cervical pain (neck) and M54.50 (ICD-10-CM) - Lumbar pain  Rationale for Evaluation and Treatment: Rehabilitation  THERAPY DIAG:  Other low back pain  Cramp and spasm  Muscle weakness (generalized)  ONSET DATE: Chronic  SUBJECTIVE:                                                                                                                                                                                            SUBJECTIVE STATEMENT: Pt reports a Hx of chronic low back and neck pain describing both as being very stiff.  PERTINENT HISTORY:  DM  PAIN:  Are you having pain? Yes: NPRS scale: 7/10 Pain location: low back Pain description: intermittent, stiff, ache, sharp Aggravating factors: Being active, walking, standing Relieving factors: Resting  Pain range on eval 0-7/10  Yes: NPRS scale: 6/10 Pain location: Neck Pain description: intermittent, stiff, ache sharp Aggravating factors: neck movements Relieving factors: Resting Pain range on eval 0-6/10  PRECAUTIONS: None  WEIGHT BEARING RESTRICTIONS: No  FALLS:  Has patient fallen in last 6 months? No  LIVING ENVIRONMENT: Lives with: lives with their family Lives in: House/apartment Able to access and be mobile in home. 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  Mild multilevel spondylosis.  Xray Lumbar 02/07/22 IMPRESSION: No acute abnormality.  Advanced lower lumbar facet arthropathy  PATIENT SURVEYS:  Not applicable  SCREENING FOR RED FLAGS: Bowel or bladder incontinence: No Spinal tumors: No Cauda equina syndrome: No Compression fracture: No  COGNITION: Overall cognitive status: Within functional limits for tasks assessed     SENSATION: WFL  MUSCLE LENGTH: Hamstrings: Right 34 deg; Left 30 deg Thomas test: Right NT deg; Left NT deg  POSTURE: rounded shoulders, forward head, decreased lumbar lordosis, increased thoracic kyphosis, flexed trunk , and knee valgus, foot supination, decreased pelvic  rotation  PALPATION: TTP for the lumbar paraspinals, and bilat SI joint area  LUMBAR ROM:   AROM eval  Flexion Mod limitation, P  Extension Mod limitation, P  Right lateral flexion Mod limitation, P  Left lateral flexion Mod limitation, P  Right rotation Min limitation, P  Left rotation Min limitation, P   (Blank rows = not tested)  LOWER EXTREMITY MMT:    Decreased core strength Passive  Right eval Left eval  Hip flexion 4 4  Hip extension 3 3  Hip abduction 4 4  Hip adduction    Hip internal rotation    Hip external rotation 3+ 3+  Knee flexion    Knee extension    Ankle dorsiflexion    Ankle plantarflexion    Ankle inversion    Ankle eversion     (Blank rows = not tested)  LOWER EXTREMITY ROM:    MMT Right eval Left eval  Hip flexion    Hip extension    Hip abduction    Hip adduction    Hip internal rotation    Hip external rotation    Knee flexion    Knee extension    Ankle dorsiflexion    Ankle plantarflexion    Ankle inversion    Ankle eversion     (Blank rows = not tested)  LUMBAR SPECIAL TESTS:  Straight leg raise test: Negative and Slump test: Negative  FUNCTIONAL TESTS:  30 seconds chair stand test= 6x with UEs  GAIT: Distance walked: 275ft Assistive device utilized: None Level of assistance: Complete Independence Comments: decreased pelvic rotation, supination, knee valgus  TODAY'S TREATMENT:  [facility] Adult PT Treatment:                                                DATE: 03/23/22 Therapeutic Exercise: Developed, instructed in, and pt completed therex as outlined in the HEP/ Self Care: Moist heat x 15 mins   PATIENT EDUCATION:  Education details: Eval findings, POC, HEP, self care  Person educated: Patient Education method: Explanation, Demonstration, Tactile cues, Verbal cues, and Handouts Education  comprehension: verbalized understanding, returned demonstration, verbal cues required, tactile cues required, and needs further education  HOME EXERCISE PROGRAM: Access Code: [facility] URL: [email or web address] Date: 03/23/2022 Prepared by: Joellyn Rued  Exercises - Supine Lower Trunk Rotation  - 2 x daily - 7 x weekly - 1 sets - 10 reps - 3 hold - Hooklying Single Knee to Chest Stretch  - 2 x daily - 7 x weekly - 1 sets - 5 reps - 5 hold - Supine Bridge  - 2 x daily - 7 x weekly - 1 sets - 10 reps - 5 hold - Seated Flexion Stretch with Swiss Ball  - 2 x daily - 7 x weekly - 1 sets - 5 reps - 10 hold - Seated Thoracic Flexion and Rotation with Swiss Ball  - 2 x daily - 7 x weekly - 1 sets - 5 reps - 10 hold  ASSESSMENT:  CLINICAL IMPRESSION: Patient is a 61 y.o. male who was seen today for physical therapy evaluation and treatment for M54.2 (ICD-10-CM) - Cervical pain (neck) and M54.50 (ICD-10-CM) - Lumbar pain. Pt stated his low back was bothering him the most and would like for it to be addressed first. Pt presents with decreased trunk ROM for all movements, increased tightness of the lumbar paraspinals, tight hamstrings, and decreased core and LE strength. Pt's STS 30 sec reps has declined since recent DC from PT on 02/21/22. Pt will benefit from skilled PT to address impairments for improved back function with less pain. Will assess neck with the next 3 PT visits.   ACTIVITY LIMITATIONS: carrying, lifting, bending, standing, stairs, and locomotion level  PARTICIPATION LIMITATIONS: cleaning, laundry, shopping, and community activity  PERSONAL FACTORS: Fitness, Past/current experiences, and Time since onset of injury/illness/exacerbation are also affecting patient's functional outcome.   REHAB POTENTIAL: Good  CLINICAL DECISION MAKING: Stable/uncomplicated  EVALUATION COMPLEXITY: Low   GOALS:  SHORT TERM GOALS: Target date: 04/13/22  Pt will be Ind in an initial  HEP  Baseline: initiated Goal status: INITIAL  2.  Pt will voice understanding of measures to assist in pain reduction  Baseline: initiated Goal status: INITIAL  LONG TERM GOALS: Target date: 05/11/22  Pt will be Ind in a final HEP to maintain achieved LOF Baseline: initiated Goal status: INITIAL  2.  Increase trunk ROMs to min limitation for improved trunk function and as indication of improved pain Baseline: see flow sheets Goal status: INITIAL  3.  Pt will report a decrease on back pain to 4/10 or less with daily activities  Baseline: pain range 0-7/10 Goal status: INITIAL  4.  Pt's will demonstrate improved function per 30 sec STS reps increase to 12 with use of armrest Baseline: 6 reps Goal status: INITIAL  5.  Increase LE strength by at least half grades for improved lumbopelvic stability Baseline: see flow sheets Goal status: INITIAL   PLAN:  PT  FREQUENCY: 2x/week  PT DURATION: 6 weeks  PLANNED INTERVENTIONS: Therapeutic exercises, Therapeutic activity, Neuromuscular re-education, Balance training, Gait training, Patient/Family education, Self Care, Joint mobilization, Stair training, Dry Needling, Electrical stimulation, Spinal manipulation, Spinal mobilization, Cryotherapy, Moist heat, Taping, Traction, Ultrasound, Ionotophoresis 4mg /ml Dexamethasone, Manual therapy, and Re-evaluation.  PLAN FOR NEXT SESSION: Assess response to HEP; progress therex as indicated; use of modalities, manual therapy; and TPDN as indicated.    Leather Estis, PT 03/23/2022, 1:02 PM

## 2022-04-01 ENCOUNTER — Ambulatory Visit
Admission: RE | Admit: 2022-04-01 | Discharge: 2022-04-01 | Disposition: A | Discharge status: Home / Self Care | Payer: BC Managed Care – PPO | Source: Ambulatory Visit | Attending: Nurse Practitioner | Admitting: Nurse Practitioner

## 2022-04-01 DIAGNOSIS — M4727 Other spondylosis with radiculopathy, lumbosacral region: Secondary | ICD-10-CM

## 2022-04-02 ENCOUNTER — Ambulatory Visit: Payer: BC Managed Care – PPO

## 2022-04-02 DIAGNOSIS — M5459 Other low back pain: Secondary | ICD-10-CM

## 2022-04-02 DIAGNOSIS — M6281 Muscle weakness (generalized): Secondary | ICD-10-CM

## 2022-04-02 DIAGNOSIS — R252 Cramp and spasm: Secondary | ICD-10-CM

## 2022-04-02 NOTE — Therapy (Signed)
OUTPATIENT PHYSICAL THERAPY TREATMENT NOTE   Patient Name: Tony Dean MRN: [id number] DOB:Mar 15, 1961, 61 y.o., male Today's Date: 04/02/2022  PCP: Kerin Perna, NP  REFERRING PROVIDER: Kerin Perna, NP   END OF SESSION:   PT End of Session - 04/02/22 0746     Visit Number 2    Date for PT Re-Evaluation 05/11/22    Authorization Type BCBS    Progress Note Due on Visit 10    PT Start Time 0746    PT Stop Time 0827    PT Time Calculation (min) 41 min    Activity Tolerance Patient tolerated treatment well    Behavior During Therapy [facility] for tasks assessed/performed             Past Medical History:  Diagnosis Date   Abnormal electrocardiogram (ECG) (EKG)    Dehydration    Dizziness    DM (diabetes mellitus) ([place])    ED (erectile dysfunction)    Gout    Hypertension    Lymphangitis of groin    Scrotal pain    Sprain of left wrist    Urinary retention    Past Surgical History:  Procedure Laterality Date   APPENDECTOMY     INGUINAL HERNIA REPAIR  1990's   right   Patient Active Problem List   Diagnosis Date Noted   Right knee pain 12/20/2021   Sepsis ([place]) 12/19/2021   Joint swelling 12/19/2021   Hyperuricemia 05/12/2020   Hypertension 05/12/2020   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   Type 2 diabetes mellitus without complication, without long-term current use of insulin ([place]) 05/12/2020   Non compliance w medication regimen 05/12/2020   Alcoholism ([place]) 05/12/2020   Erectile disorder due to medical condition in male 05/12/2020   History of gout 05/12/2020   Other constipation 05/12/2020   Neuropathy 05/12/2020    REFERRING DIAG:  M54.2 (ICD-10-CM) - Cervical pain (neck)   M54.50 (ICD-10-CM) - Lumbar pain   THERAPY DIAG:  Other low back pain  Cramp and spasm  Muscle weakness (generalized)  Rationale for Evaluation and Treatment Rehabilitation  PERTINENT HISTORY: DM II  PRECAUTIONS: None  SUBJECTIVE:                                                                                                                                                                                       SUBJECTIVE STATEMENT:  Pt presents to PT with reports of continued severe LBP and discomfort. Also continues to note neck pain. Has been compliant with HEP. Pt is ready to begin PT at this time.   PAIN:  Are you having pain?  Yes: NPRS scale: 10/10 Pain location:  low back Pain description: intermittent, stiff, ache, sharp Aggravating factors: Being active, walking, standing Relieving factors: Resting Pain range on eval 0-7/10   Yes: NPRS scale: 8/10 Pain location: Neck Pain description: intermittent, stiff, ache sharp Aggravating factors: neck movements Relieving factors: Resting Pain range on eval 0-6/10   OBJECTIVE: (objective measures completed at initial evaluation unless otherwise dated)  DIAGNOSTIC FINDINGS:  MRI scheduled for 04/01/22   Xray Cervical 02/07/22 IMPRESSION: No acute abnormality.  Mild multilevel spondylosis.   Xray Lumbar 02/07/22 IMPRESSION: No acute abnormality.  Advanced lower lumbar facet arthropathy   PATIENT SURVEYS:  Not applicable   SCREENING FOR RED FLAGS: Bowel or bladder incontinence: No Spinal tumors: No Cauda equina syndrome: No Compression fracture: No   VITALS: BP: 111/80 - left arm in sitting                        SENSATION: WFL   MUSCLE LENGTH: Hamstrings: Right 34 deg; Left 30 deg Thomas test: Right NT deg; Left NT deg   POSTURE: rounded shoulders, forward head, decreased lumbar lordosis, increased thoracic kyphosis, flexed trunk , and knee valgus, foot supination, decreased pelvic rotation   PALPATION: TTP for the lumbar paraspinals, and bilat SI joint area   LUMBAR ROM:    AROM eval  Flexion Mod limitation, P  Extension Mod limitation, P  Right lateral flexion Mod limitation, P  Left lateral flexion Mod limitation, P  Right rotation Min limitation, P   Left rotation Min limitation, P   (Blank rows = not tested)   LOWER EXTREMITY MMT:    Decreased core strength Passive  Right eval Left eval  Hip flexion 4 4  Hip extension 3 3  Hip abduction 4 4  Hip adduction      Hip internal rotation      Hip external rotation 3+ 3+  Knee flexion      Knee extension      Ankle dorsiflexion      Ankle plantarflexion      Ankle inversion      Ankle eversion       (Blank rows = not tested)   LOWER EXTREMITY ROM:     MMT Right eval Left eval  Hip flexion      Hip extension      Hip abduction      Hip adduction      Hip internal rotation      Hip external rotation      Knee flexion      Knee extension      Ankle dorsiflexion      Ankle plantarflexion      Ankle inversion      Ankle eversion       (Blank rows = not tested)   LUMBAR SPECIAL TESTS:  Straight leg raise test: Negative and Slump test: Negative   FUNCTIONAL TESTS:  30 seconds chair stand test= 6x with UEs   GAIT: Distance walked: 243ft Assistive device utilized: None Level of assistance: Complete Independence Comments: decreased pelvic rotation, supination, knee valgus   TREATMENT: [facility] Adult PT Treatment:                                                DATE: 04/02/2022 Therapeutic Exercise: NuStep lvl 5 UE/LE x 5 min while taking subjective Seated hamstring stretch 2x30"  each LTR x 10 each Bridge 2x10 SLR (small range)  x 10 each LAQ 2x10 each Self Care: Monitoring of vitals and response to given sugars   PATIENT EDUCATION:  Education details: continue HEP Person educated: Patient Education method: Explanation, Demonstration, Tactile cues, Verbal cues, and Handouts Education comprehension: verbalized understanding, returned demonstration, verbal cues required, tactile cues required, and needs further education   HOME EXERCISE PROGRAM: Access Code: [facility] URL: [email or web address] Date: 03/23/2022 Prepared by: Joellyn Rued    Exercises - Supine Lower Trunk Rotation  - 2 x daily - 7 x weekly - 1 sets - 10 reps - 3 hold - Hooklying Single Knee to Chest Stretch  - 2 x daily - 7 x weekly - 1 sets - 5 reps - 5 hold - Supine Bridge  - 2 x daily - 7 x weekly - 1 sets - 10 reps - 5 hold - Seated Flexion Stretch with Swiss Ball  - 2 x daily - 7 x weekly - 1 sets - 5 reps - 10 hold - Seated Thoracic Flexion and Rotation with Swiss Ball  - 2 x daily - 7 x weekly - 1 sets - 5 reps - 10 hold   ASSESSMENT:   CLINICAL IMPRESSION: Pt was able to complete prescribed exercises but was limited secondary to lower back pain. Therapy today focused on improving LE/core strength and LE/lumbar ROM in order to decrease pain and improve function. Pt noted increased pain with attempted lumbar flexion in sitting. Session stopped due to c/o dizziness, with BP check and simple sugars given. PT walked with patient to front where he waited for his son to pick him up. Advised pt to contact PCP if symptoms continue. Will continue to progress as tolerated per POC.    ACTIVITY LIMITATIONS: carrying, lifting, bending, standing, stairs, and locomotion level   PARTICIPATION LIMITATIONS: cleaning, laundry, shopping, and community activity   PERSONAL FACTORS: Fitness, Past/current experiences, and Time since onset of injury/illness/exacerbation are also affecting patient's functional outcome.      GOALS:   SHORT TERM GOALS: Target date: 04/13/22   Pt will be Ind in an initial HEP  Baseline: initiated Goal status: INITIAL   2.  Pt will voice understanding of measures to assist in pain reduction  Baseline: initiated Goal status: INITIAL   LONG TERM GOALS: Target date: 05/11/22   Pt will be Ind in a final HEP to maintain achieved LOF Baseline: initiated Goal status: INITIAL   2.  Increase trunk ROMs to min limitation for improved trunk function and as indication of improved pain Baseline: see flow sheets Goal status: INITIAL   3.  Pt will  report a decrease on back pain to 4/10 or less with daily activities  Baseline: pain range 0-7/10 Goal status: INITIAL   4.  Pt's will demonstrate improved function per 30 sec STS reps increase to 12 with use of armrest Baseline: 6 reps Goal status: INITIAL   5.  Increase LE strength by at least half grades for improved lumbopelvic stability Baseline: see flow sheets Goal status: INITIAL     PLAN:   PT FREQUENCY: 2x/week   PT DURATION: 6 weeks   PLANNED INTERVENTIONS: Therapeutic exercises, Therapeutic activity, Neuromuscular re-education, Balance training, Gait training, Patient/Family education, Self Care, Joint mobilization, Stair training, Dry Needling, Electrical stimulation, Spinal manipulation, Spinal mobilization, Cryotherapy, Moist heat, Taping, Traction, Ultrasound, Ionotophoresis 4mg /ml Dexamethasone, Manual therapy, and Re-evaluation.   PLAN FOR NEXT SESSION: Assess response to HEP; progress therex as indicated; use  of modalities, manual therapy; and TPDN as indicated.   Ward Chatters, PT 04/02/2022, 10:09 AM

## 2022-04-04 ENCOUNTER — Ambulatory Visit: Payer: BC Managed Care – PPO

## 2022-04-04 DIAGNOSIS — M5459 Other low back pain: Secondary | ICD-10-CM | POA: Diagnosis not present

## 2022-04-04 DIAGNOSIS — R252 Cramp and spasm: Secondary | ICD-10-CM

## 2022-04-04 DIAGNOSIS — M542 Cervicalgia: Secondary | ICD-10-CM

## 2022-04-04 DIAGNOSIS — R293 Abnormal posture: Secondary | ICD-10-CM

## 2022-04-04 DIAGNOSIS — R2689 Other abnormalities of gait and mobility: Secondary | ICD-10-CM

## 2022-04-04 DIAGNOSIS — M6281 Muscle weakness (generalized): Secondary | ICD-10-CM

## 2022-04-04 DIAGNOSIS — R262 Difficulty in walking, not elsewhere classified: Secondary | ICD-10-CM

## 2022-04-04 NOTE — Therapy (Signed)
OUTPATIENT PHYSICAL THERAPY TREATMENT NOTE/Neck Cert   Patient Name: Tony Dean MRN: [id number] DOB:01-Sep-1960, 61 y.o., male Today's Date: 04/04/2022  PCP: Kerin Perna, NP  REFERRING PROVIDER: Kerin Perna, NP   END OF SESSION:   PT End of Session - 04/04/22 1348     Visit Number 3    Number of Visits 17    Date for PT Re-Evaluation 05/11/22    Authorization Type BCBS    PT Start Time 1146    PT Stop Time 1247    PT Time Calculation (min) 61 min    Activity Tolerance Patient tolerated treatment well;Patient limited by pain    Behavior During Therapy WFL for tasks assessed/performed             Past Medical History:  Diagnosis Date   Abnormal electrocardiogram (ECG) (EKG)    Dehydration    Dizziness    DM (diabetes mellitus) ([place])    ED (erectile dysfunction)    Gout    Hypertension    Lymphangitis of groin    Scrotal pain    Sprain of left wrist    Urinary retention    Past Surgical History:  Procedure Laterality Date   APPENDECTOMY     INGUINAL HERNIA REPAIR  1990's   right   Patient Active Problem List   Diagnosis Date Noted   Right knee pain 12/20/2021   Sepsis ([place]) 12/19/2021   Joint swelling 12/19/2021   Hyperuricemia 05/12/2020   Hypertension 05/12/2020   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   Type 2 diabetes mellitus without complication, without long-term current use of insulin ([place]) 05/12/2020   Non compliance w medication regimen 05/12/2020   Alcoholism ([place]) 05/12/2020   Erectile disorder due to medical condition in male 05/12/2020   History of gout 05/12/2020   Other constipation 05/12/2020   Neuropathy 05/12/2020    REFERRING DIAG:  M54.2 (ICD-10-CM) - Cervical pain (neck)   M54.50 (ICD-10-CM) - Lumbar pain   THERAPY DIAG:  Other low back pain - Plan: PT plan of care cert/re-cert  Cramp and spasm - Plan: PT plan of care cert/re-cert  Muscle weakness (generalized) - Plan: PT plan of care cert/re-cert  Other  abnormalities of gait and mobility - Plan: PT plan of care cert/re-cert  Difficulty in walking, not elsewhere classified - Plan: PT plan of care cert/re-cert  Cervicalgia - Plan: PT plan of care cert/re-cert  Abnormal posture - Plan: PT plan of care cert/re-cert  Rationale for Evaluation and Treatment Rehabilitation  PERTINENT HISTORY: DM II  PRECAUTIONS: None  SUBJECTIVE:  SUBJECTIVE STATEMENT: Pt reports his low back is continuing to be significantly painful. He notes having an appt earlier today at the [facility] for back care and they have ordered a MRI and are going to provide an injection in the future.  PAIN:  Are you having pain?  Yes: NPRS scale: 10/10 Pain location: low back Pain description: intermittent, stiff, ache, sharp Aggravating factors: Being active, walking, standing Relieving factors: Resting Pain range on eval 0-7/10   Yes: NPRS scale: 8/10 Pain location: Neck Pain description: intermittent, stiff, ache sharp Aggravating factors: neck movements Relieving factors: Resting Pain range on eval 0-6/10   OBJECTIVE: (objective measures completed at initial evaluation unless otherwise dated)  DIAGNOSTIC FINDINGS:  MRI scheduled for 04/01/22   Xray Cervical 02/07/22 IMPRESSION: No acute abnormality.  Mild multilevel spondylosis.   Xray Lumbar 02/07/22 IMPRESSION: No acute abnormality.  Advanced lower lumbar facet arthropathy   PATIENT SURVEYS:  Not applicable   SCREENING FOR RED FLAGS: Bowel or bladder incontinence: No Spinal tumors: No Cauda equina syndrome: No Compression fracture: No   VITALS: BP: 111/80 - left arm in sitting                        SENSATION: WFL   MUSCLE LENGTH: Hamstrings: Right 34 deg; Left 30 deg Thomas test: Right NT deg; Left NT deg    POSTURE: rounded shoulders, forward head, decreased lumbar lordosis, increased thoracic kyphosis, flexed trunk , and knee valgus, foot supination, decreased pelvic rotation   PALPATION: TTP for the lumbar paraspinals, and bilat SI joint area   LUMBAR ROM:    AROM eval  Flexion Mod limitation, P  Extension Mod limitation, P  Right lateral flexion Mod limitation, P  Left lateral flexion Mod limitation, P  Right rotation Min limitation, P  Left rotation Min limitation, P   (Blank rows = not tested)   LOWER EXTREMITY MMT:    Decreased core strength Passive  Right eval Left eval  Hip flexion 4 4  Hip extension 3 3  Hip abduction 4 4  Hip adduction      Hip internal rotation      Hip external rotation 3+ 3+  Knee flexion      Knee extension      Ankle dorsiflexion      Ankle plantarflexion      Ankle inversion      Ankle eversion       (Blank rows = not tested)   LOWER EXTREMITY ROM:     MMT Right eval Left eval  Hip flexion      Hip extension      Hip abduction      Hip adduction      Hip internal rotation      Hip external rotation      Knee flexion      Knee extension      Ankle dorsiflexion      Ankle plantarflexion      Ankle inversion      Ankle eversion       (Blank rows = not tested)   LUMBAR SPECIAL TESTS:  Straight leg raise test: Negative and Slump test: Negative   FUNCTIONAL TESTS:  30 seconds chair stand test= 6x with UEs   GAIT: Distance walked: 269ft Assistive device utilized: None Level of assistance: Complete Independence Comments: decreased pelvic rotation, supination, knee valgus  CERVICAL ROM:   Active ROM APROM (deg) eval  Flexion 21 P  Extension 29 P significant  Right lateral flexion 5 P significant  Left lateral flexion 15 P  Right rotation 12 P significant  Left rotation 21 P   (Blank rows = not tested)   CERVICAL SPECIAL TESTS:  Spurling's test: Negative   Shoulder MMT:    Both shoulders are 4+ to 5/5 and equal in  strength  TREATMENT: [facility] Adult PT Treatment:                                                DATE: 04/04/22 Therapeutic Exercise: Supine Lower Trunk Rotation  5 reps - 3 hold Hooklying Single Knee to Chest Stretch  - 2 reps - 5 hold Marching x10 each LE Cervical rotation x5 10" with gentle over pressure Updated HEP Manual Therapy: Cervical traction and STM to the upper traps Axial distraction to each LE Therapeutic Activity: Cervical ROM and strength for functional ability  Modalities: MH x10 mins to the neck, mid back and low back   [facility] Adult PT Treatment:     5                                          DATE: 04/02/2022 Therapeutic Exercise: NuStep lvl 5 UE/LE x 5 min while taking subjective Seated hamstring stretch 2x30" each LTR x 10 each Bridge 2x10 SLR (small range)  x 10 each LAQ 2x10 each Self Care: Monitoring of vitals and response to given sugars   PATIENT EDUCATION:  Education details: continue HEP Person educated: Patient Education method: Explanation, Demonstration, Tactile cues, Verbal cues, and Handouts Education comprehension: verbalized understanding, returned demonstration, verbal cues required, tactile cues required, and needs further education   HOME EXERCISE PROGRAM: Access Code: [facility] URL: [email or web address] Date: 04/04/2022 Prepared by: Joellyn Rued  Exercises - Supine Lower Trunk Rotation  - 2 x daily - 7 x weekly - 1 sets - 10 reps - 3 hold - Hooklying Single Knee to Chest Stretch  - 2 x daily - 7 x weekly - 1 sets - 5 reps - 5 hold - Supine Bridge  - 2 x daily - 7 x weekly - 1 sets - 10 reps - 5 hold - Seated Flexion Stretch with Swiss Ball  - 2 x daily - 7 x weekly - 1 sets - 5 reps - 10 hold - Seated Thoracic Flexion and Rotation with Swiss Ball  - 2 x daily - 7 x weekly - 1 sets - 5 reps - 10 hold - Standing Cervical Rotation AROM with Overpressure  - 1 x daily - 7 x weekly - 1 sets - 5 reps - 10 hold   ASSESSMENT:    CLINICAL IMPRESSION: Pt is experiencing an increase in both low back and cervical pain. Cervical mobility was found to be significantly limited with all motions and painful, esp. Ext, R side bending, and R rotation impacting his neck function. PT was completed to address pain and mobility per moist heat, STM to neck, manual cervical traction and axial traction of the legs. After the session, pt reported no change with either his neck for low back. Pt tolerated PT today without adverse effects. Pt is to complete hsi HEP as tolerated. Pt may benefit from continued skilled PT to address impairments for  improved neck and back function with less pain.  ACTIVITY LIMITATIONS: carrying, lifting, bending, standing, stairs, and locomotion level   PARTICIPATION LIMITATIONS: cleaning, laundry, shopping, and community activity   PERSONAL FACTORS: Fitness, Past/current experiences, and Time since onset of injury/illness/exacerbation are also affecting patient's functional outcome.      GOALS:   SHORT TERM GOALS: Target date: 04/13/22   Pt will be Ind in an initial HEP  Baseline: initiated Goal status: INITIAL   2.  Pt will voice understanding of measures to assist in pain reduction  Baseline: initiated Goal status: INITIAL   LONG TERM GOALS: Target date: 05/11/22   Pt will be Ind in a final HEP to maintain achieved LOF Baseline: initiated Goal status: INITIAL   2.  Increase trunk ROMs to min limitation for improved trunk function and as indication of improved pain Baseline: see flow sheets Goal status: INITIAL   3.  Pt will report a decrease for back and neck pain to 4/10 or less with daily activities  Baseline: Back pain range 0-7/10. Neck pain range 0-6/10   4.  Pt's will demonstrate improved function per 30 sec STS reps increase to 12 with use of armrest Baseline: 6 reps Goal status: INITIAL   5.  Increase LE strength by at least half grades for improved lumbopelvic stability Baseline:  see flow sheets Goal status: INITIAL  6.  Increase pt's cervical ROM by 10d for improved neck function.     PLAN:   PT FREQUENCY: 2x/week   PT DURATION: 6 weeks   PLANNED INTERVENTIONS: Therapeutic exercises, Therapeutic activity, Neuromuscular re-education, Balance training, Gait training, Patient/Family education, Self Care, Joint mobilization, Stair training, Dry Needling, Electrical stimulation, Spinal manipulation, Spinal mobilization, Cryotherapy, Moist heat, Taping, Traction, Ultrasound, Ionotophoresis 4mg /ml Dexamethasone, Manual therapy, and Re-evaluation.   PLAN FOR NEXT SESSION: Assess response to HEP; progress therex as indicated; use of modalities, manual therapy; and TPDN as indicated.  Chakira Jachim MS, PT 04/04/22 2:18 PM

## 2022-04-09 ENCOUNTER — Ambulatory Visit: Payer: BC Managed Care – PPO

## 2022-04-09 DIAGNOSIS — M5459 Other low back pain: Secondary | ICD-10-CM | POA: Diagnosis not present

## 2022-04-09 DIAGNOSIS — R252 Cramp and spasm: Secondary | ICD-10-CM

## 2022-04-09 DIAGNOSIS — M6281 Muscle weakness (generalized): Secondary | ICD-10-CM

## 2022-04-09 NOTE — Therapy (Signed)
OUTPATIENT PHYSICAL THERAPY TREATMENT NOTE/Neck Cert   Patient Name: Tony Dean MRN: [id number] DOB:[birth year], 61 y.o., male Today's Date: 04/09/2022  PCP: Grayce Sessions, NP  REFERRING PROVIDER: Grayce Sessions, NP   END OF SESSION:   PT End of Session - 04/09/22 0907     Visit Number 4    Number of Visits 17    Date for PT Re-Evaluation 05/11/22    Authorization Type BCBS    PT Start Time 0915    PT Stop Time 0955    PT Time Calculation (min) 40 min    Activity Tolerance Patient tolerated treatment well;Patient limited by pain    Behavior During Therapy [facility] for tasks assessed/performed              Past Medical History:  Diagnosis Date   Abnormal electrocardiogram (ECG) (EKG)    Dehydration    Dizziness    DM (diabetes mellitus) ([facility])    ED (erectile dysfunction)    Gout    Hypertension    Lymphangitis of groin    Scrotal pain    Sprain of left wrist    Urinary retention    Past Surgical History:  Procedure Laterality Date   APPENDECTOMY     INGUINAL HERNIA REPAIR  1990's   right   Patient Active Problem List   Diagnosis Date Noted   Right knee pain 12/20/2021   Sepsis ([facility]) 12/19/2021   Joint swelling 12/19/2021   Hyperuricemia 05/12/2020   Hypertension 05/12/2020   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   Type 2 diabetes mellitus without complication, without long-term current use of insulin ([facility]) 05/12/2020   Non compliance w medication regimen 05/12/2020   Alcoholism ([facility]) 05/12/2020   Erectile disorder due to medical condition in male 05/12/2020   History of gout 05/12/2020   Other constipation 05/12/2020   Neuropathy 05/12/2020    REFERRING DIAG:  M54.2 (ICD-10-CM) - Cervical pain (neck)   M54.50 (ICD-10-CM) - Lumbar pain   THERAPY DIAG:  Other low back pain  Cramp and spasm  Muscle weakness (generalized)  Rationale for Evaluation and Treatment Rehabilitation  PERTINENT HISTORY: DM II  PRECAUTIONS:  None  SUBJECTIVE:                                                                                                                                                                                      SUBJECTIVE STATEMENT: Pt presents to PT with reports of slight decrease in pain today. Has been compliant with HEP per report. Ready to begin PT session at this time.   PAIN:  Are you having pain?  Yes: NPRS scale: 8/10 Pain location: low back Pain  description: intermittent, stiff, ache, sharp Aggravating factors: Being active, walking, standing Relieving factors: Resting Pain range on eval 0-7/10   Yes: NPRS scale: 8/10 Pain location: Neck Pain description: intermittent, stiff, ache sharp Aggravating factors: neck movements Relieving factors: Resting Pain range on eval 0-6/10   OBJECTIVE: (objective measures completed at initial evaluation unless otherwise dated)  DIAGNOSTIC FINDINGS:  MRI scheduled for 04/01/22   Xray Cervical 02/07/22 IMPRESSION: No acute abnormality.  Mild multilevel spondylosis.   Xray Lumbar 02/07/22 IMPRESSION: No acute abnormality.  Advanced lower lumbar facet arthropathy   PATIENT SURVEYS:  Not applicable   SCREENING FOR RED FLAGS: Bowel or bladder incontinence: No Spinal tumors: No Cauda equina syndrome: No Compression fracture: No   VITALS: BP: 111/80 - left arm in sitting                        SENSATION: WFL   MUSCLE LENGTH: Hamstrings: Right 34 deg; Left 30 deg Thomas test: Right NT deg; Left NT deg   POSTURE: rounded shoulders, forward head, decreased lumbar lordosis, increased thoracic kyphosis, flexed trunk , and knee valgus, foot supination, decreased pelvic rotation   PALPATION: TTP for the lumbar paraspinals, and bilat SI joint area   LUMBAR ROM:    AROM eval  Flexion Mod limitation, P  Extension Mod limitation, P  Right lateral flexion Mod limitation, P  Left lateral flexion Mod limitation, P  Right rotation Min  limitation, P  Left rotation Min limitation, P   (Blank rows = not tested)   LOWER EXTREMITY MMT:    Decreased core strength Passive  Right eval Left eval  Hip flexion 4 4  Hip extension 3 3  Hip abduction 4 4  Hip adduction      Hip internal rotation      Hip external rotation 3+ 3+  Knee flexion      Knee extension      Ankle dorsiflexion      Ankle plantarflexion      Ankle inversion      Ankle eversion       (Blank rows = not tested)   LOWER EXTREMITY ROM:     MMT Right eval Left eval  Hip flexion      Hip extension      Hip abduction      Hip adduction      Hip internal rotation      Hip external rotation      Knee flexion      Knee extension      Ankle dorsiflexion      Ankle plantarflexion      Ankle inversion      Ankle eversion       (Blank rows = not tested)   LUMBAR SPECIAL TESTS:  Straight leg raise test: Negative and Slump test: Negative   FUNCTIONAL TESTS:  30 seconds chair stand test= 6x with UEs   GAIT: Distance walked: 23100ft Assistive device utilized: None Level of assistance: Complete Independence Comments: decreased pelvic rotation, supination, knee valgus  CERVICAL ROM:   Active ROM APROM (deg) eval  Flexion 21 P  Extension 29 P significant  Right lateral flexion 5 P significant  Left lateral flexion 15 P  Right rotation 12 P significant  Left rotation 21 P   (Blank rows = not tested)   CERVICAL SPECIAL TESTS:  Spurling's test: Negative   Shoulder MMT:    Both shoulders are 4+ to 5/5 and equal in  strength  TREATMENT: [facility] Adult PT Treatment:                                                DATE: 04/09/22 Therapeutic Exercise: NuStep lvl 5 UE/LE x 5 min while taking subjective  LTR x 10 SLR 2x10 each Supine clamshell 2x15 GTB Bridge 2x10 Supine horizontal abd 2x15 RTB Supine chin tuck x 10 - difficult Seated bilateral ER 2x10 RTB LAQ 2x10 2# Standing row 2x10 GTB  [facility] Adult PT Treatment:                                                 DATE: 04/04/22 Therapeutic Exercise: Supine Lower Trunk Rotation  5 reps - 3 hold Hooklying Single Knee to Chest Stretch  - 2 reps - 5 hold Marching x10 each LE Cervical rotation x5 10" with gentle over pressure Updated HEP Manual Therapy: Cervical traction and STM to the upper traps Axial distraction to each LE Therapeutic Activity: Cervical ROM and strength for functional ability  Modalities: MH x10 mins to the neck, mid back and low back   [facility] Adult PT Treatment:                                              DATE: 04/02/2022 Therapeutic Exercise: NuStep lvl 5 UE/LE x 5 min while taking subjective Seated hamstring stretch 2x30" each LTR x 10 each Bridge 2x10 SLR (small range)  x 10 each LAQ 2x10 each Self Care: Monitoring of vitals and response to given sugars   PATIENT EDUCATION:  Education details: continue HEP Person educated: Patient Education method: Explanation, Demonstration, Tactile cues, Verbal cues, and Handouts Education comprehension: verbalized understanding, returned demonstration, verbal cues required, tactile cues required, and needs further education   HOME EXERCISE PROGRAM: Access Code: [facility] URL: [email or web address] Date: 04/04/2022 Prepared by: Joellyn Rued  Exercises - Supine Lower Trunk Rotation  - 2 x daily - 7 x weekly - 1 sets - 10 reps - 3 hold - Hooklying Single Knee to Chest Stretch  - 2 x daily - 7 x weekly - 1 sets - 5 reps - 5 hold - Supine Bridge  - 2 x daily - 7 x weekly - 1 sets - 10 reps - 5 hold - Seated Flexion Stretch with Swiss Ball  - 2 x daily - 7 x weekly - 1 sets - 5 reps - 10 hold - Seated Thoracic Flexion and Rotation with Swiss Ball  - 2 x daily - 7 x weekly - 1 sets - 5 reps - 10 hold - Standing Cervical Rotation AROM with Overpressure  - 1 x daily - 7 x weekly - 1 sets - 5 reps - 10 hold   ASSESSMENT:   CLINICAL IMPRESSION: Pt was able to complete all prescribed exercises with  improved tolerance today. Therapy focused on core, hip, and periscapular strengthening in order to decrease back and neck pain. Pt continues to benefit from skilled PT services, will continue to progress as able per POC.   ACTIVITY LIMITATIONS: carrying, lifting, bending, standing, stairs, and locomotion level  PARTICIPATION LIMITATIONS: cleaning, laundry, shopping, and community activity   PERSONAL FACTORS: Fitness, Past/current experiences, and Time since onset of injury/illness/exacerbation are also affecting patient's functional outcome.      GOALS:   SHORT TERM GOALS: Target date: 04/13/22   Pt will be Ind in an initial HEP  Baseline: initiated Goal status: INITIAL   2.  Pt will voice understanding of measures to assist in pain reduction  Baseline: initiated Goal status: INITIAL   LONG TERM GOALS: Target date: 05/11/22   Pt will be Ind in a final HEP to maintain achieved LOF Baseline: initiated Goal status: INITIAL   2.  Increase trunk ROMs to min limitation for improved trunk function and as indication of improved pain Baseline: see flow sheets Goal status: INITIAL   3.  Pt will report a decrease for back and neck pain to 4/10 or less with daily activities  Baseline: Back pain range 0-7/10. Neck pain range 0-6/10   4.  Pt's will demonstrate improved function per 30 sec STS reps increase to 12 with use of armrest Baseline: 6 reps Goal status: INITIAL   5.  Increase LE strength by at least half grades for improved lumbopelvic stability Baseline: see flow sheets Goal status: INITIAL  6.  Increase pt's cervical ROM by 10d for improved neck function.     PLAN:   PT FREQUENCY: 2x/week   PT DURATION: 6 weeks   PLANNED INTERVENTIONS: Therapeutic exercises, Therapeutic activity, Neuromuscular re-education, Balance training, Gait training, Patient/Family education, Self Care, Joint mobilization, Stair training, Dry Needling, Electrical stimulation, Spinal manipulation,  Spinal mobilization, Cryotherapy, Moist heat, Taping, Traction, Ultrasound, Ionotophoresis 4mg /ml Dexamethasone, Manual therapy, and Re-evaluation.   PLAN FOR NEXT SESSION: Assess response to HEP; progress therex as indicated; use of modalities, manual therapy; and TPDN as indicated.   PT  04/09/22 10:02 AM

## 2022-04-11 ENCOUNTER — Ambulatory Visit: Payer: BC Managed Care – PPO

## 2022-04-11 DIAGNOSIS — M6281 Muscle weakness (generalized): Secondary | ICD-10-CM

## 2022-04-11 DIAGNOSIS — R252 Cramp and spasm: Secondary | ICD-10-CM

## 2022-04-11 DIAGNOSIS — M5459 Other low back pain: Secondary | ICD-10-CM | POA: Diagnosis not present

## 2022-04-11 NOTE — Therapy (Signed)
OUTPATIENT PHYSICAL THERAPY TREATMENT NOTE/Neck Cert   Patient Name: Tony Dean MRN: [id number] DOB:04-20-1961, 61 y.o., male Today's Date: 04/11/2022  PCP: Grayce Sessions, NP  REFERRING PROVIDER: Grayce Sessions, NP   END OF SESSION:   PT End of Session - 04/11/22 1024     Visit Number 5    Number of Visits 17    Date for PT Re-Evaluation 05/11/22    Authorization Type BCBS    PT Start Time 1040    PT Stop Time 1118    PT Time Calculation (min) 38 min    Activity Tolerance Patient tolerated treatment well;Patient limited by pain    Behavior During Therapy WFL for tasks assessed/performed               Past Medical History:  Diagnosis Date   Abnormal electrocardiogram (ECG) (EKG)    Dehydration    Dizziness    DM (diabetes mellitus) (HCC)    ED (erectile dysfunction)    Gout    Hypertension    Lymphangitis of groin    Scrotal pain    Sprain of left wrist    Urinary retention    Past Surgical History:  Procedure Laterality Date   APPENDECTOMY     INGUINAL HERNIA REPAIR  1990's   right   Patient Active Problem List   Diagnosis Date Noted   Right knee pain 12/20/2021   Sepsis (HCC) 12/19/2021   Joint swelling 12/19/2021   Hyperuricemia 05/12/2020   Hypertension 05/12/2020   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   Type 2 diabetes mellitus without complication, without long-term current use of insulin (HCC) 05/12/2020   Non compliance w medication regimen 05/12/2020   Alcoholism (HCC) 05/12/2020   Erectile disorder due to medical condition in male 05/12/2020   History of gout 05/12/2020   Other constipation 05/12/2020   Neuropathy 05/12/2020    REFERRING DIAG:  M54.2 (ICD-10-CM) - Cervical pain (neck)   M54.50 (ICD-10-CM) - Lumbar pain   THERAPY DIAG:  Other low back pain  Cramp and spasm  Muscle weakness (generalized)  Rationale for Evaluation and Treatment Rehabilitation  PERTINENT HISTORY: DM II  PRECAUTIONS:  None  SUBJECTIVE:                                                                                                                                                                                      SUBJECTIVE STATEMENT: Pt presents to PT with reports of continued lower back and neck pain. Has been compliant with HEP. Ready to begin PT treatment at this time.   PAIN:  Are you having pain?  Yes: NPRS scale: 8/10 Pain location: low back Pain  description: intermittent, stiff, ache, sharp Aggravating factors: Being active, walking, standing Relieving factors: Resting Pain range on eval 0-7/10   Yes: NPRS scale: 7/10 Pain location: Neck Pain description: intermittent, stiff, ache sharp Aggravating factors: neck movements Relieving factors: Resting Pain range on eval 0-6/10   OBJECTIVE: (objective measures completed at initial evaluation unless otherwise dated)  DIAGNOSTIC FINDINGS:  MRI scheduled for 04/01/22   Xray Cervical 02/07/22 IMPRESSION: No acute abnormality.  Mild multilevel spondylosis.   Xray Lumbar 02/07/22 IMPRESSION: No acute abnormality.  Advanced lower lumbar facet arthropathy   PATIENT SURVEYS:  Not applicable   SCREENING FOR RED FLAGS: Bowel or bladder incontinence: No Spinal tumors: No Cauda equina syndrome: No Compression fracture: No   VITALS: BP: 111/80 - left arm in sitting                        SENSATION: WFL   MUSCLE LENGTH: Hamstrings: Right 34 deg; Left 30 deg Thomas test: Right NT deg; Left NT deg   POSTURE: rounded shoulders, forward head, decreased lumbar lordosis, increased thoracic kyphosis, flexed trunk , and knee valgus, foot supination, decreased pelvic rotation   PALPATION: TTP for the lumbar paraspinals, and bilat SI joint area   LUMBAR ROM:    AROM eval  Flexion Mod limitation, P  Extension Mod limitation, P  Right lateral flexion Mod limitation, P  Left lateral flexion Mod limitation, P  Right rotation Min  limitation, P  Left rotation Min limitation, P   (Blank rows = not tested)   LOWER EXTREMITY MMT:    Decreased core strength Passive  Right eval Left eval  Hip flexion 4 4  Hip extension 3 3  Hip abduction 4 4  Hip adduction      Hip internal rotation      Hip external rotation 3+ 3+  Knee flexion      Knee extension      Ankle dorsiflexion      Ankle plantarflexion      Ankle inversion      Ankle eversion       (Blank rows = not tested)   LOWER EXTREMITY ROM:     MMT Right eval Left eval  Hip flexion      Hip extension      Hip abduction      Hip adduction      Hip internal rotation      Hip external rotation      Knee flexion      Knee extension      Ankle dorsiflexion      Ankle plantarflexion      Ankle inversion      Ankle eversion       (Blank rows = not tested)   LUMBAR SPECIAL TESTS:  Straight leg raise test: Negative and Slump test: Negative   FUNCTIONAL TESTS:  30 seconds chair stand test= 6x with UEs   GAIT: Distance walked: 227ft Assistive device utilized: None Level of assistance: Complete Independence Comments: decreased pelvic rotation, supination, knee valgus  CERVICAL ROM:   Active ROM APROM (deg) eval  Flexion 21 P  Extension 29 P significant  Right lateral flexion 5 P significant  Left lateral flexion 15 P  Right rotation 12 P significant  Left rotation 21 P   (Blank rows = not tested)   CERVICAL SPECIAL TESTS:  Spurling's test: Negative   Shoulder MMT:    Both shoulders are 4+ to 5/5 and equal in  strength  TREATMENT: [facility] Adult PT Treatment:                                                DATE: 04/11/22 Therapeutic Exercise: NuStep lvl 5 UE/LE x 5 min while taking subjective  Row 2x10 17# LTR x 10 SLR 2x10 each Supine clamshell 3x15 GTB Bridge 3x10 Supine horizontal abd 2x10 GTB Supine chin tuck x 10 - difficult Seated bilateral ER 2x10 GTB Seated pilates ring squeeze 2x10 LAQ 3x10 2.5# STS 2x10 - no UE  [facility]  Adult PT Treatment:                                                DATE: 04/09/22 Therapeutic Exercise: NuStep lvl 5 UE/LE x 5 min while taking subjective  LTR x 10 SLR 2x10 each Supine clamshell 2x15 GTB Bridge 2x10 Supine horizontal abd 2x15 RTB Supine chin tuck x 10 - difficult Seated bilateral ER 2x10 RTB LAQ 2x10 2# Standing row 2x10 GTB  [facility] Adult PT Treatment:                                                DATE: 04/04/22 Therapeutic Exercise: Supine Lower Trunk Rotation  5 reps - 3 hold Hooklying Single Knee to Chest Stretch  - 2 reps - 5 hold Marching x10 each LE Cervical rotation x5 10" with gentle over pressure Updated HEP Manual Therapy: Cervical traction and STM to the upper traps Axial distraction to each LE Therapeutic Activity: Cervical ROM and strength for functional ability  Modalities: MH x10 mins to the neck, mid back and low back   [facility] Adult PT Treatment:                                              DATE: 04/02/2022 Therapeutic Exercise: NuStep lvl 5 UE/LE x 5 min while taking subjective Seated hamstring stretch 2x30" each LTR x 10 each Bridge 2x10 SLR (small range)  x 10 each LAQ 2x10 each Self Care: Monitoring of vitals and response to given sugars   PATIENT EDUCATION:  Education details: continue HEP Person educated: Patient Education method: Explanation, Demonstration, Tactile cues, Verbal cues, and Handouts Education comprehension: verbalized understanding, returned demonstration, verbal cues required, tactile cues required, and needs further education   HOME EXERCISE PROGRAM: Access Code: [facility] URL: [email or web address] Date: 04/04/2022 Prepared by: Gar Ponto  Exercises - Supine Lower Trunk Rotation  - 2 x daily - 7 x weekly - 1 sets - 10 reps - 3 hold - Hooklying Single Knee to Chest Stretch  - 2 x daily - 7 x weekly - 1 sets - 5 reps - 5 hold - Supine Bridge  - 2 x daily - 7 x weekly - 1 sets - 10 reps - 5 hold -  Seated Flexion Stretch with Swiss Ball  - 2 x daily - 7 x weekly - 1 sets - 5 reps - 10 hold - Seated Thoracic Flexion and  Rotation with [employer]  - 2 x daily - 7 x weekly - 1 sets - 5 reps - 10 hold - Standing Cervical Rotation AROM with Overpressure  - 1 x daily - 7 x weekly - 1 sets - 5 reps - 10 hold   ASSESSMENT:   CLINICAL IMPRESSION: Pt was able to complete all prescribed exercises with continued improvement in tolerance today. Therapy focused on core, hip, and periscapular strengthening in order to decrease back and neck pain. Pt continues to benefit from skilled PT services, will continue to progress as able per POC.   ACTIVITY LIMITATIONS: carrying, lifting, bending, standing, stairs, and locomotion level   PARTICIPATION LIMITATIONS: cleaning, laundry, shopping, and community activity   PERSONAL FACTORS: Fitness, Past/current experiences, and Time since onset of injury/illness/exacerbation are also affecting patient's functional outcome.      GOALS:   SHORT TERM GOALS: Target date: 04/13/22   Pt will be Ind in an initial HEP  Baseline: initiated Goal status: INITIAL   2.  Pt will voice understanding of measures to assist in pain reduction  Baseline: initiated Goal status: INITIAL   LONG TERM GOALS: Target date: 05/11/22   Pt will be Ind in a final HEP to maintain achieved LOF Baseline: initiated Goal status: INITIAL   2.  Increase trunk ROMs to min limitation for improved trunk function and as indication of improved pain Baseline: see flow sheets Goal status: INITIAL   3.  Pt will report a decrease for back and neck pain to 4/10 or less with daily activities  Baseline: Back pain range 0-7/10. Neck pain range 0-6/10   4.  Pt's will demonstrate improved function per 30 sec STS reps increase to 12 with use of armrest Baseline: 6 reps Goal status: INITIAL   5.  Increase LE strength by at least half grades for improved lumbopelvic stability Baseline: see flow  sheets Goal status: INITIAL  6.  Increase pt's cervical ROM by 10d for improved neck function.     PLAN:   PT FREQUENCY: 2x/week   PT DURATION: 6 weeks   PLANNED INTERVENTIONS: Therapeutic exercises, Therapeutic activity, Neuromuscular re-education, Balance training, Gait training, Patient/Family education, Self Care, Joint mobilization, Stair training, Dry Needling, Electrical stimulation, Spinal manipulation, Spinal mobilization, Cryotherapy, Moist heat, Taping, Traction, Ultrasound, Ionotophoresis 4mg /ml Dexamethasone, Manual therapy, and Re-evaluation.   PLAN FOR NEXT SESSION: Assess response to HEP; progress therex as indicated; use of modalities, manual therapy; and TPDN as indicated.   Ward Chatters PT  04/11/22 11:21 AM

## 2022-04-15 ENCOUNTER — Other Ambulatory Visit (INDEPENDENT_AMBULATORY_CARE_PROVIDER_SITE_OTHER): Payer: Self-pay | Admitting: Primary Care

## 2022-04-15 DIAGNOSIS — E7841 Elevated Lipoprotein(a): Secondary | ICD-10-CM

## 2022-04-15 DIAGNOSIS — E119 Type 2 diabetes mellitus without complications: Secondary | ICD-10-CM

## 2022-04-17 ENCOUNTER — Ambulatory Visit: Payer: BC Managed Care – PPO

## 2022-04-17 DIAGNOSIS — M6281 Muscle weakness (generalized): Secondary | ICD-10-CM

## 2022-04-17 DIAGNOSIS — M5459 Other low back pain: Secondary | ICD-10-CM

## 2022-04-17 DIAGNOSIS — R252 Cramp and spasm: Secondary | ICD-10-CM

## 2022-04-17 MED ORDER — METFORMIN HCL ER 500 MG PO TB24: 500.0000 mg | ORAL_TABLET | Freq: Every day | ORAL | 0 refills | Status: DC

## 2022-04-17 NOTE — Therapy (Signed)
OUTPATIENT PHYSICAL THERAPY TREATMENT NOTE   Patient Name: Tony HusbandsKenneth Smoak MRN: [id number] DOB:04-Mar-1961, 61 y.o., male Today's Date: 04/17/2022  PCP: Grayce SessionsEdwards, Michelle P, NP  REFERRING PROVIDER: Grayce SessionsEdwards, Michelle P, NP   END OF SESSION:   PT End of Session - 04/17/22 0858     Visit Number 6    Number of Visits 17    Date for PT Re-Evaluation 05/11/22    Authorization Type BCBS    PT Start Time 0915    PT Stop Time 0956    PT Time Calculation (min) 41 min    Activity Tolerance Patient tolerated treatment well;Patient limited by pain    Behavior [facility] tasks assessed/performed                Past Medical History:  Diagnosis Date   Abnormal electrocardiogram (ECG) (EKG)    Dehydration    Dizziness    DM (diabetes mellitus) (HCC)    ED (erectile dysfunction)    Gout    Hypertension    Lymphangitis of groin    Scrotal pain    Sprain of left wrist    Urinary retention    Past Surgical History:  Procedure Laterality Date   APPENDECTOMY     INGUINAL HERNIA REPAIR  1990's   right   Patient Active Problem List   Diagnosis Date Noted   Right knee pain 12/20/2021   Sepsis (HCC) 12/19/2021   Joint swelling 12/19/2021   Hyperuricemia 05/12/2020   Hypertension 05/12/2020   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   Type 2 diabetes mellitus without complication, without long-term current use of insulin (HCC) 05/12/2020   Non compliance w medication regimen 05/12/2020   Alcoholism (HCC) 05/12/2020   Erectile disorder due to medical condition in male 05/12/2020   History of gout 05/12/2020   Other constipation 05/12/2020   Neuropathy 05/12/2020    REFERRING DIAG:  M54.2 (ICD-10-CM) - Cervical pain (neck)   M54.50 (ICD-10-CM) - Lumbar pain   THERAPY DIAG:  Other low back pain  Cramp and spasm  Muscle weakness (generalized)  Rationale for Evaluation and Treatment Rehabilitation  PERTINENT HISTORY: DM II  PRECAUTIONS: None  SUBJECTIVE:                                                                                                                                                                                       SUBJECTIVE STATEMENT: Pt presents to PT with reports of continued neck and lower back pain. Has been compliant with HEP with no adverse effect. Pt is ready to begin PT at this time.   PAIN:  Are you having pain?  Yes: NPRS scale: 8/10  Pain location: low back Pain description: intermittent, stiff, ache, sharp Aggravating factors: Being active, walking, standing Relieving factors: Resting Pain range on eval 0-7/10   Yes: NPRS scale: 8/10 Pain location: Neck Pain description: intermittent, stiff, ache sharp Aggravating factors: neck movements Relieving factors: Resting Pain range on eval 0-6/10   OBJECTIVE: (objective measures completed at initial evaluation unless otherwise dated)  DIAGNOSTIC FINDINGS:  MRI scheduled for 04/01/22   Xray Cervical 02/07/22 IMPRESSION: No acute abnormality.  Mild multilevel spondylosis.   Xray Lumbar 02/07/22 IMPRESSION: No acute abnormality.  Advanced lower lumbar facet arthropathy   PATIENT SURVEYS:  Not applicable   SCREENING FOR RED FLAGS: Bowel or bladder incontinence: No Spinal tumors: No Cauda equina syndrome: No Compression fracture: No   VITALS: BP: 111/80 - left arm in sitting                        SENSATION: WFL   MUSCLE LENGTH: Hamstrings: Right 34 deg; Left 30 deg Thomas test: Right NT deg; Left NT deg   POSTURE: rounded shoulders, forward head, decreased lumbar lordosis, increased thoracic kyphosis, flexed trunk , and knee valgus, foot supination, decreased pelvic rotation   PALPATION: TTP for the lumbar paraspinals, and bilat SI joint area   LUMBAR ROM:    AROM eval  Flexion Mod limitation, P  Extension Mod limitation, P  Right lateral flexion Mod limitation, P  Left lateral flexion Mod limitation, P  Right rotation Min limitation, P   Left rotation Min limitation, P   (Blank rows = not tested)   LOWER EXTREMITY MMT:    Decreased core strength Passive  Right eval Left eval  Hip flexion 4 4  Hip extension 3 3  Hip abduction 4 4  Hip adduction      Hip internal rotation      Hip external rotation 3+ 3+  Knee flexion      Knee extension      Ankle dorsiflexion      Ankle plantarflexion      Ankle inversion      Ankle eversion       (Blank rows = not tested)   LOWER EXTREMITY ROM:     MMT Right eval Left eval  Hip flexion      Hip extension      Hip abduction      Hip adduction      Hip internal rotation      Hip external rotation      Knee flexion      Knee extension      Ankle dorsiflexion      Ankle plantarflexion      Ankle inversion      Ankle eversion       (Blank rows = not tested)   LUMBAR SPECIAL TESTS:  Straight leg raise test: Negative and Slump test: Negative   FUNCTIONAL TESTS:  30 seconds chair stand test= 6x with UEs   GAIT: Distance walked: 276ft Assistive device utilized: None Level of assistance: Complete Independence Comments: decreased pelvic rotation, supination, knee valgus  CERVICAL ROM:   Active ROM APROM (deg) eval  Flexion 21 P  Extension 29 P significant  Right lateral flexion 5 P significant  Left lateral flexion 15 P  Right rotation 12 P significant  Left rotation 21 P   (Blank rows = not tested)   CERVICAL SPECIAL TESTS:  Spurling's test: Negative   Shoulder MMT:    Both shoulders are 4+  to 5/5 and equal in strength  TREATMENT: [facility] Adult PT Treatment:                                                DATE: 04/17/22 Therapeutic Exercise: NuStep lvl 5 UE/LE x 5 min while taking subjective  Row 3x10 17# LTR x 10 SLR 2x10 each Supine clamshell 3x15 GTB Supine march 2x20 GTB Bridge 3x10 Supine horizontal abd 2x15 GTB Supine chin tuck x 10 - difficult Seated bilateral ER 2x15 GTB Seated pilates ring squeeze 2x10 LAQ 2x10 3# STS 2x10 - no  UE  [facility] Adult PT Treatment:                                                DATE: 04/11/22 Therapeutic Exercise: NuStep lvl 5 UE/LE x 5 min while taking subjective  Row 2x10 17# LTR x 10 SLR 2x10 each Supine clamshell 3x15 GTB Bridge 3x10 Supine horizontal abd 2x10 GTB Supine chin tuck x 10 - difficult Seated bilateral ER 2x10 GTB Seated pilates ring squeeze 2x10 LAQ 3x10 2.5# STS 2x10 - no UE  [facility] Adult PT Treatment:                                                DATE: 04/09/22 Therapeutic Exercise: NuStep lvl 5 UE/LE x 5 min while taking subjective  LTR x 10 SLR 2x10 each Supine clamshell 2x15 GTB Bridge 2x10 Supine horizontal abd 2x15 RTB Supine chin tuck x 10 - difficult Seated bilateral ER 2x10 RTB LAQ 2x10 2# Standing row 2x10 GTB  [facility] Adult PT Treatment:                                                DATE: 04/04/22 Therapeutic Exercise: Supine Lower Trunk Rotation  5 reps - 3 hold Hooklying Single Knee to Chest Stretch  - 2 reps - 5 hold Marching x10 each LE Cervical rotation x5 10" with gentle over pressure Updated HEP Manual Therapy: Cervical traction and STM to the upper traps Axial distraction to each LE Therapeutic Activity: Cervical ROM and strength for functional ability  Modalities: MH x10 mins to the neck, mid back and low back   [facility] Adult PT Treatment:                                              DATE: 04/02/2022 Therapeutic Exercise: NuStep lvl 5 UE/LE x 5 min while taking subjective Seated hamstring stretch 2x30" each LTR x 10 each Bridge 2x10 SLR (small range)  x 10 each LAQ 2x10 each Self Care: Monitoring of vitals and response to given sugars   PATIENT EDUCATION:  Education details: continue HEP Person educated: Patient Education method: Explanation, Demonstration, Tactile cues, Verbal cues, and Handouts Education comprehension: verbalized understanding, returned demonstration, verbal cues required, tactile cues required, and  needs  further education   HOME EXERCISE PROGRAM: Access Code: [facility] URL: [email or web address] Date: 04/04/2022 Prepared by: Joellyn Rued  Exercises - Supine Lower Trunk Rotation  - 2 x daily - 7 x weekly - 1 sets - 10 reps - 3 hold - Hooklying Single Knee to Chest Stretch  - 2 x daily - 7 x weekly - 1 sets - 5 reps - 5 hold - Supine Bridge  - 2 x daily - 7 x weekly - 1 sets - 10 reps - 5 hold - Seated Flexion Stretch with Swiss Ball  - 2 x daily - 7 x weekly - 1 sets - 5 reps - 10 hold - Seated Thoracic Flexion and Rotation with Swiss Ball  - 2 x daily - 7 x weekly - 1 sets - 5 reps - 10 hold - Standing Cervical Rotation AROM with Overpressure  - 1 x daily - 7 x weekly - 1 sets - 5 reps - 10 hold   ASSESSMENT:   CLINICAL IMPRESSION: Pt was able to complete all prescribed exercises with continued improvement in tolerance today. Therapy focused on core, hip, and periscapular strengthening in order to decrease back and neck pain. Pt continues to benefit from skilled PT services, will continue to progress as able per POC.   ACTIVITY LIMITATIONS: carrying, lifting, bending, standing, stairs, and locomotion level   PARTICIPATION LIMITATIONS: cleaning, laundry, shopping, and community activity   PERSONAL FACTORS: Fitness, Past/current experiences, and Time since onset of injury/illness/exacerbation are also affecting patient's functional outcome.      GOALS:   SHORT TERM GOALS: Target date: 04/13/22   Pt will be Ind in an initial HEP  Baseline: initiated Goal status: INITIAL   2.  Pt will voice understanding of measures to assist in pain reduction  Baseline: initiated Goal status: INITIAL   LONG TERM GOALS: Target date: 05/11/22   Pt will be Ind in a final HEP to maintain achieved LOF Baseline: initiated Goal status: INITIAL   2.  Increase trunk ROMs to min limitation for improved trunk function and as indication of improved pain Baseline: see flow sheets Goal  status: INITIAL   3.  Pt will report a decrease for back and neck pain to 4/10 or less with daily activities  Baseline: Back pain range 0-7/10. Neck pain range 0-6/10   4.  Pt's will demonstrate improved function per 30 sec STS reps increase to 12 with use of armrest Baseline: 6 reps Goal status: INITIAL   5.  Increase LE strength by at least half grades for improved lumbopelvic stability Baseline: see flow sheets Goal status: INITIAL  6.  Increase pt's cervical ROM by 10d for improved neck function.     PLAN:   PT FREQUENCY: 2x/week   PT DURATION: 6 weeks   PLANNED INTERVENTIONS: Therapeutic exercises, Therapeutic activity, Neuromuscular re-education, Balance training, Gait training, Patient/Family education, Self Care, Joint mobilization, Stair training, Dry Needling, Electrical stimulation, Spinal manipulation, Spinal mobilization, Cryotherapy, Moist heat, Taping, Traction, Ultrasound, Ionotophoresis 4mg /ml Dexamethasone, Manual therapy, and Re-evaluation.   PLAN FOR NEXT SESSION: Assess response to HEP; progress therex as indicated; use of modalities, manual therapy; and TPDN as indicated.   PT  04/17/22 10:00 AM

## 2022-04-18 ENCOUNTER — Other Ambulatory Visit (INDEPENDENT_AMBULATORY_CARE_PROVIDER_SITE_OTHER): Payer: Self-pay | Admitting: Primary Care

## 2022-04-18 DIAGNOSIS — E7841 Elevated Lipoprotein(a): Secondary | ICD-10-CM

## 2022-04-18 MED ORDER — ATORVASTATIN CALCIUM 10 MG PO TABS: 10.0000 mg | ORAL_TABLET | Freq: Every day | ORAL | 1 refills | Status: DC

## 2022-04-19 ENCOUNTER — Ambulatory Visit: Payer: BC Managed Care – PPO

## 2022-04-24 ENCOUNTER — Ambulatory Visit: Payer: BC Managed Care – PPO | Attending: Primary Care

## 2022-04-24 DIAGNOSIS — R2689 Other abnormalities of gait and mobility: Secondary | ICD-10-CM | POA: Diagnosis present

## 2022-04-24 DIAGNOSIS — R293 Abnormal posture: Secondary | ICD-10-CM | POA: Insufficient documentation

## 2022-04-24 DIAGNOSIS — R252 Cramp and spasm: Secondary | ICD-10-CM | POA: Diagnosis present

## 2022-04-24 DIAGNOSIS — R262 Difficulty in walking, not elsewhere classified: Secondary | ICD-10-CM | POA: Insufficient documentation

## 2022-04-24 DIAGNOSIS — M542 Cervicalgia: Secondary | ICD-10-CM | POA: Insufficient documentation

## 2022-04-24 DIAGNOSIS — M6281 Muscle weakness (generalized): Secondary | ICD-10-CM | POA: Insufficient documentation

## 2022-04-24 DIAGNOSIS — M5459 Other low back pain: Secondary | ICD-10-CM | POA: Diagnosis present

## 2022-04-24 NOTE — Therapy (Signed)
OUTPATIENT PHYSICAL THERAPY TREATMENT NOTE   Patient Name: Tony Dean MRN: [id number] DOB:[birth year], 62 y.o., male Today's Date: 04/24/2022  PCP: Grayce Sessions, NP  REFERRING PROVIDER: Grayce Sessions, NP   END OF SESSION:   PT End of Session - 04/24/22 0958     Visit Number 7    Number of Visits 17    Date for PT Re-Evaluation 05/11/22    Authorization Type BCBS    PT Start Time 1002    PT Stop Time 1040    PT Time Calculation (min) 38 min    Activity Tolerance Patient tolerated treatment well;Patient limited by pain    Behavior During Therapy WFL for tasks assessed/performed                Past Medical History:  Diagnosis Date   Abnormal electrocardiogram (ECG) (EKG)    Dehydration    Dizziness    DM (diabetes mellitus) (HCC)    ED (erectile dysfunction)    Gout    Hypertension    Lymphangitis of groin    Scrotal pain    Sprain of left wrist    Urinary retention    Past Surgical History:  Procedure Laterality Date   APPENDECTOMY     INGUINAL HERNIA REPAIR  1990's   right   Patient Active Problem List   Diagnosis Date Noted   Right knee pain 12/20/2021   Sepsis (HCC) 12/19/2021   Joint swelling 12/19/2021   Hyperuricemia 05/12/2020   Hypertension 05/12/2020   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   Type 2 diabetes mellitus without complication, without long-term current use of insulin (HCC) 05/12/2020   Non compliance w medication regimen 05/12/2020   Alcoholism (HCC) 05/12/2020   Erectile disorder due to medical condition in male 05/12/2020   History of gout 05/12/2020   Other constipation 05/12/2020   Neuropathy 05/12/2020    REFERRING DIAG:  M54.2 (ICD-10-CM) - Cervical pain (neck)   M54.50 (ICD-10-CM) - Lumbar pain   THERAPY DIAG:  Other low back pain  Cramp and spasm  Muscle weakness (generalized)  Rationale for Evaluation and Treatment Rehabilitation  PERTINENT HISTORY: DM II  PRECAUTIONS: None  SUBJECTIVE:                                                                                                                                                                                       SUBJECTIVE STATEMENT: Pt presents to PT with reports of continued neck and back pain. Has been compliant with HEP with no adverse effect. Ready to begin PT at this time.   PAIN:  Are you having pain?  Yes: NPRS scale: 8/10 Pain location: low  back Pain description: intermittent, stiff, ache, sharp Aggravating factors: Being active, walking, standing Relieving factors: Resting Pain range on eval 0-7/10   Yes: NPRS scale: 8/10 Pain location: Neck Pain description: intermittent, stiff, ache sharp Aggravating factors: neck movements Relieving factors: Resting Pain range on eval 0-6/10   OBJECTIVE: (objective measures completed at initial evaluation unless otherwise dated)  DIAGNOSTIC FINDINGS:  MRI scheduled for 04/01/22   Xray Cervical 02/07/22 IMPRESSION: No acute abnormality.  Mild multilevel spondylosis.   Xray Lumbar 02/07/22 IMPRESSION: No acute abnormality.  Advanced lower lumbar facet arthropathy   PATIENT SURVEYS:  Not applicable   SCREENING FOR RED FLAGS: Bowel or bladder incontinence: No Spinal tumors: No Cauda equina syndrome: No Compression fracture: No   VITALS: BP: 111/80 - left arm in sitting                        SENSATION: WFL   MUSCLE LENGTH: Hamstrings: Right 34 deg; Left 30 deg Thomas test: Right NT deg; Left NT deg   POSTURE: rounded shoulders, forward head, decreased lumbar lordosis, increased thoracic kyphosis, flexed trunk , and knee valgus, foot supination, decreased pelvic rotation   PALPATION: TTP for the lumbar paraspinals, and bilat SI joint area   LUMBAR ROM:    AROM eval  Flexion Mod limitation, P  Extension Mod limitation, P  Right lateral flexion Mod limitation, P  Left lateral flexion Mod limitation, P  Right rotation Min limitation, P  Left  rotation Min limitation, P   (Blank rows = not tested)   LOWER EXTREMITY MMT:    Decreased core strength Passive  Right eval Left eval  Hip flexion 4 4  Hip extension 3 3  Hip abduction 4 4  Hip adduction      Hip internal rotation      Hip external rotation 3+ 3+  Knee flexion      Knee extension      Ankle dorsiflexion      Ankle plantarflexion      Ankle inversion      Ankle eversion       (Blank rows = not tested)   LOWER EXTREMITY ROM:     MMT Right eval Left eval  Hip flexion      Hip extension      Hip abduction      Hip adduction      Hip internal rotation      Hip external rotation      Knee flexion      Knee extension      Ankle dorsiflexion      Ankle plantarflexion      Ankle inversion      Ankle eversion       (Blank rows = not tested)   LUMBAR SPECIAL TESTS:  Straight leg raise test: Negative and Slump test: Negative   FUNCTIONAL TESTS:  30 seconds chair stand test= 6x with UEs   GAIT: Distance walked: 22ft Assistive device utilized: None Level of assistance: Complete Independence Comments: decreased pelvic rotation, supination, knee valgus  CERVICAL ROM:   Active ROM APROM (deg) eval  Flexion 21 P  Extension 29 P significant  Right lateral flexion 5 P significant  Left lateral flexion 15 P  Right rotation 12 P significant  Left rotation 21 P   (Blank rows = not tested)   CERVICAL SPECIAL TESTS:  Spurling's test: Negative   Shoulder MMT:    Both shoulders are 4+ to 5/5 and  equal in strength  TREATMENT: [facility] Adult PT Treatment:                                                DATE: 04/24/2022 Therapeutic Exercise: Rec bike lvl 2 x 2 min while taking subjective  Row 3x10 20# LTR x 10 SLR 2x15 each Supine clamshell 3x15 GTB Supine march 2x20 GTB Bridge 3x10 Supine horizontal abd 2x15 GTB Supine chin tuck x 10 - difficult Seated bilateral ER 2x15 GTB Seated pilates ring squeeze 2x10 LAQ 3x10 3# STS x 10 - no UE  [facility] Adult  PT Treatment:                                                DATE: 04/17/22 Therapeutic Exercise: NuStep lvl 5 UE/LE x 5 min while taking subjective  Row 3x10 17# LTR x 10 SLR 2x10 each Supine clamshell 3x15 GTB Supine march 2x20 GTB Bridge 3x10 Supine horizontal abd 2x15 GTB Supine chin tuck x 10 - difficult Seated bilateral ER 2x15 GTB Seated pilates ring squeeze 2x10 LAQ 2x10 3# STS 2x10 - no UE  [facility] Adult PT Treatment:                                                DATE: 04/11/22 Therapeutic Exercise: NuStep lvl 5 UE/LE x 5 min while taking subjective  Row 2x10 17# LTR x 10 SLR 2x10 each Supine clamshell 3x15 GTB Bridge 3x10 Supine horizontal abd 2x10 GTB Supine chin tuck x 10 - difficult Seated bilateral ER 2x10 GTB Seated pilates ring squeeze 2x10 LAQ 3x10 2.5# STS 2x10 - no UE  [facility] Adult PT Treatment:                                                DATE: 04/09/22 Therapeutic Exercise: NuStep lvl 5 UE/LE x 5 min while taking subjective  LTR x 10 SLR 2x10 each Supine clamshell 2x15 GTB Bridge 2x10 Supine horizontal abd 2x15 RTB Supine chin tuck x 10 - difficult Seated bilateral ER 2x10 RTB LAQ 2x10 2# Standing row 2x10 GTB  PATIENT EDUCATION:  Education details: continue HEP Person educated: Patient Education method: Explanation, Demonstration, Tactile cues, Verbal cues, and Handouts Education comprehension: verbalized understanding, returned demonstration, verbal cues required, tactile cues required, and needs further education   HOME EXERCISE PROGRAM: Access Code: [facility] URL: [email or web address] Date: 04/04/2022 Prepared by: Joellyn Rued  Exercises - Supine Lower Trunk Rotation  - 2 x daily - 7 x weekly - 1 sets - 10 reps - 3 hold - Hooklying Single Knee to Chest Stretch  - 2 x daily - 7 x weekly - 1 sets - 5 reps - 5 hold - Supine Bridge  - 2 x daily - 7 x weekly - 1 sets - 10 reps - 5 hold - Seated Flexion Stretch with Swiss Ball   - 2 x daily - 7 x weekly - 1 sets - 5 reps -  10 hold - Seated Thoracic Flexion and Rotation with Swiss Ball  - 2 x daily - 7 x weekly - 1 sets - 5 reps - 10 hold - Standing Cervical Rotation AROM with Overpressure  - 1 x daily - 7 x weekly - 1 sets - 5 reps - 10 hold   ASSESSMENT:   CLINICAL IMPRESSION: Pt was able to complete all prescribed exercises with continued improvement in tolerance today. Therapy focused on core, hip, and periscapular strengthening in order to decrease back and neck pain. Pt continues to progres well but fatigues quickly with strengthening. Will continue to progress as able per POC.    ACTIVITY LIMITATIONS: carrying, lifting, bending, standing, stairs, and locomotion level   PARTICIPATION LIMITATIONS: cleaning, laundry, shopping, and community activity   PERSONAL FACTORS: Fitness, Past/current experiences, and Time since onset of injury/illness/exacerbation are also affecting patient's functional outcome.      GOALS:   SHORT TERM GOALS: Target date: 04/13/22   Pt will be Ind in an initial HEP  Baseline: initiated Goal status: INITIAL   2.  Pt will voice understanding of measures to assist in pain reduction  Baseline: initiated Goal status: INITIAL   LONG TERM GOALS: Target date: 05/11/22   Pt will be Ind in a final HEP to maintain achieved LOF Baseline: initiated Goal status: INITIAL   2.  Increase trunk ROMs to min limitation for improved trunk function and as indication of improved pain Baseline: see flow sheets Goal status: INITIAL   3.  Pt will report a decrease for back and neck pain to 4/10 or less with daily activities  Baseline: Back pain range 0-7/10. Neck pain range 0-6/10   4.  Pt's will demonstrate improved function per 30 sec STS reps increase to 12 with use of armrest Baseline: 6 reps Goal status: INITIAL   5.  Increase LE strength by at least half grades for improved lumbopelvic stability Baseline: see flow sheets Goal status:  INITIAL  6.  Increase pt's cervical ROM by 10d for improved neck function.     PLAN:   PT FREQUENCY: 2x/week   PT DURATION: 6 weeks   PLANNED INTERVENTIONS: Therapeutic exercises, Therapeutic activity, Neuromuscular re-education, Balance training, Gait training, Patient/Family education, Self Care, Joint mobilization, Stair training, Dry Needling, Electrical stimulation, Spinal manipulation, Spinal mobilization, Cryotherapy, Moist heat, Taping, Traction, Ultrasound, Ionotophoresis 4mg /ml Dexamethasone, Manual therapy, and Re-evaluation.   PLAN FOR NEXT SESSION: Assess response to HEP; progress therex as indicated; use of modalities, manual therapy; and TPDN as indicated.   Ward Chatters PT  04/24/22 10:40 AM

## 2022-04-25 NOTE — Therapy (Signed)
OUTPATIENT PHYSICAL THERAPY TREATMENT NOTE   Patient Name: Tony Dean MRN: [id number] DOB:July 23, 1960, 62 y.o., male Today's Date: 04/26/2022  PCP: Kerin Perna, NP  REFERRING PROVIDER: Kerin Perna, NP   END OF SESSION:   PT End of Session - 04/26/22 0944     Visit Number 8    Number of Visits 17    Date for PT Re-Evaluation 05/11/22    Authorization Type BCBS    PT Start Time 0955    PT Stop Time [id number]    PT Time Calculation (min) 40 min    Activity Tolerance Patient tolerated treatment well;Patient limited by pain    Behavior During Therapy [facility] for tasks assessed/performed                 Past Medical History:  Diagnosis Date   Abnormal electrocardiogram (ECG) (EKG)    Dehydration    Dizziness    DM (diabetes mellitus) ([place])    ED (erectile dysfunction)    Gout    Hypertension    Lymphangitis of groin    Scrotal pain    Sprain of left wrist    Urinary retention    Past Surgical History:  Procedure Laterality Date   APPENDECTOMY     INGUINAL HERNIA REPAIR  1990's   right   Patient Active Problem List   Diagnosis Date Noted   Right knee pain 12/20/2021   Sepsis ([place]) 12/19/2021   Joint swelling 12/19/2021   Hyperuricemia 05/12/2020   Hypertension 05/12/2020   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   Type 2 diabetes mellitus without complication, without long-term current use of insulin ([place]) 05/12/2020   Non compliance w medication regimen 05/12/2020   Alcoholism ([place]) 05/12/2020   Erectile disorder due to medical condition in male 05/12/2020   History of gout 05/12/2020   Other constipation 05/12/2020   Neuropathy 05/12/2020    REFERRING DIAG:  M54.2 (ICD-10-CM) - Cervical pain (neck)   M54.50 (ICD-10-CM) - Lumbar pain   THERAPY DIAG:  Other low back pain  Cramp and spasm  Muscle weakness (generalized)  Rationale for Evaluation and Treatment Rehabilitation  PERTINENT HISTORY: DM II  PRECAUTIONS: None  SUBJECTIVE:                                                                                                                                                                                       SUBJECTIVE STATEMENT: Pt presents to PT with reports of pain in low back and neck. Has been compliant with HEP. Ready to begin PT at this time.   PAIN:  Are you having pain?  Yes: NPRS scale: 7/10 Pain location: low back Pain  description: intermittent, stiff, ache, sharp Aggravating factors: Being active, walking, standing Relieving factors: Resting Pain range on eval 0-7/10   Yes: NPRS scale: 7/10 Pain location: Neck Pain description: intermittent, stiff, ache sharp Aggravating factors: neck movements Relieving factors: Resting Pain range on eval 0-6/10   OBJECTIVE: (objective measures completed at initial evaluation unless otherwise dated)  DIAGNOSTIC FINDINGS:  MRI scheduled for 04/01/22   Xray Cervical 02/07/22 IMPRESSION: No acute abnormality.  Mild multilevel spondylosis.   Xray Lumbar 02/07/22 IMPRESSION: No acute abnormality.  Advanced lower lumbar facet arthropathy   PATIENT SURVEYS:  Not applicable   SCREENING FOR RED FLAGS: Bowel or bladder incontinence: No Spinal tumors: No Cauda equina syndrome: No Compression fracture: No   VITALS: BP: 111/80 - left arm in sitting                        SENSATION: WFL   MUSCLE LENGTH: Hamstrings: Right 34 deg; Left 30 deg Thomas test: Right NT deg; Left NT deg   POSTURE: rounded shoulders, forward head, decreased lumbar lordosis, increased thoracic kyphosis, flexed trunk , and knee valgus, foot supination, decreased pelvic rotation   PALPATION: TTP for the lumbar paraspinals, and bilat SI joint area   LUMBAR ROM:    AROM eval  Flexion Mod limitation, P  Extension Mod limitation, P  Right lateral flexion Mod limitation, P  Left lateral flexion Mod limitation, P  Right rotation Min limitation, P  Left rotation Min limitation, P    (Blank rows = not tested)   LOWER EXTREMITY MMT:    Decreased core strength Passive  Right eval Left eval  Hip flexion 4 4  Hip extension 3 3  Hip abduction 4 4  Hip adduction      Hip internal rotation      Hip external rotation 3+ 3+  Knee flexion      Knee extension      Ankle dorsiflexion      Ankle plantarflexion      Ankle inversion      Ankle eversion       (Blank rows = not tested)   LOWER EXTREMITY ROM:     MMT Right eval Left eval  Hip flexion      Hip extension      Hip abduction      Hip adduction      Hip internal rotation      Hip external rotation      Knee flexion      Knee extension      Ankle dorsiflexion      Ankle plantarflexion      Ankle inversion      Ankle eversion       (Blank rows = not tested)   LUMBAR SPECIAL TESTS:  Straight leg raise test: Negative and Slump test: Negative   FUNCTIONAL TESTS:  30 seconds chair stand test= 6x with UEs   GAIT: Distance walked: 218ft Assistive device utilized: None Level of assistance: Complete Independence Comments: decreased pelvic rotation, supination, knee valgus  CERVICAL ROM:   Active ROM APROM (deg) eval  Flexion 21 P  Extension 29 P significant  Right lateral flexion 5 P significant  Left lateral flexion 15 P  Right rotation 12 P significant  Left rotation 21 P   (Blank rows = not tested)   CERVICAL SPECIAL TESTS:  Spurling's test: Negative   Shoulder MMT:    Both shoulders are 4+ to 5/5 and equal in  strength  TREATMENT: [facility] Adult PT Treatment:                                                DATE: 04/26/2022 Therapeutic Exercise: Rec bike lvl 2 x 2 min while taking subjective  Row 3x10 20# STS 2x10 - high table LAQ 2x10 4# LTR x 10 SLR 2x15 each Supine clamshell 3x15 BTB Supine march 2x20 BTB Bridge 3x10 Supine horizontal abd 2x15 BTB Standing hip abd/ext 2x10 RTB  [facility] Adult PT Treatment:                                                DATE: 04/24/2022 Therapeutic  Exercise: Rec bike lvl 2 x 2 min while taking subjective  Row 3x10 20# LTR x 10 SLR 2x15 each Supine clamshell 3x15 GTB Supine march 2x20 GTB Bridge 3x10 Supine horizontal abd 2x15 GTB Supine chin tuck x 10 - difficult Seated bilateral ER 2x15 GTB Seated pilates ring squeeze 2x10 LAQ 3x10 3# STS x 10 - no UE  [facility] Adult PT Treatment:                                                DATE: 04/17/22 Therapeutic Exercise: NuStep lvl 5 UE/LE x 5 min while taking subjective  Row 3x10 17# LTR x 10 SLR 2x10 each Supine clamshell 3x15 GTB Supine march 2x20 GTB Bridge 3x10 Supine horizontal abd 2x15 GTB Supine chin tuck x 10 - difficult Seated bilateral ER 2x15 GTB Seated pilates ring squeeze 2x10 LAQ 2x10 3# STS 2x10 - no UE  [facility] Adult PT Treatment:                                                DATE: 04/11/22 Therapeutic Exercise: NuStep lvl 5 UE/LE x 5 min while taking subjective  Row 2x10 17# LTR x 10 SLR 2x10 each Supine clamshell 3x15 GTB Bridge 3x10 Supine horizontal abd 2x10 GTB Supine chin tuck x 10 - difficult Seated bilateral ER 2x10 GTB Seated pilates ring squeeze 2x10 LAQ 3x10 2.5# STS 2x10 - no UE  [facility] Adult PT Treatment:                                                DATE: 04/09/22 Therapeutic Exercise: NuStep lvl 5 UE/LE x 5 min while taking subjective  LTR x 10 SLR 2x10 each Supine clamshell 2x15 GTB Bridge 2x10 Supine horizontal abd 2x15 RTB Supine chin tuck x 10 - difficult Seated bilateral ER 2x10 RTB LAQ 2x10 2# Standing row 2x10 GTB  PATIENT EDUCATION:  Education details: continue HEP Person educated: Patient Education method: Explanation, Demonstration, Tactile cues, Verbal cues, and Handouts Education comprehension: verbalized understanding, returned demonstration, verbal cues required, tactile cues required, and needs further education   HOME EXERCISE PROGRAM: Access Code: [facility]  URL: [email or web address] Date:  04/04/2022 Prepared by: Gar Ponto  Exercises - Supine Lower Trunk Rotation  - 2 x daily - 7 x weekly - 1 sets - 10 reps - 3 hold - Hooklying Single Knee to Chest Stretch  - 2 x daily - 7 x weekly - 1 sets - 5 reps - 5 hold - Supine Bridge  - 2 x daily - 7 x weekly - 1 sets - 10 reps - 5 hold - Seated Flexion Stretch with Swiss Ball  - 2 x daily - 7 x weekly - 1 sets - 5 reps - 10 hold - Seated Thoracic Flexion and Rotation with Swiss Ball  - 2 x daily - 7 x weekly - 1 sets - 5 reps - 10 hold - Standing Cervical Rotation AROM with Overpressure  - 1 x daily - 7 x weekly - 1 sets - 5 reps - 10 hold   ASSESSMENT:   CLINICAL IMPRESSION: Pt was able to complete all prescribed exercises with continued improvement in tolerance today. Therapy focused on core, hip, and periscapular strengthening in order to decrease back and neck pain. Pt continues to progres well but fatigues quickly with strengthening. Will continue to progress as able per POC.  ACTIVITY LIMITATIONS: carrying, lifting, bending, standing, stairs, and locomotion level   PARTICIPATION LIMITATIONS: cleaning, laundry, shopping, and community activity   PERSONAL FACTORS: Fitness, Past/current experiences, and Time since onset of injury/illness/exacerbation are also affecting patient's functional outcome.      GOALS:   SHORT TERM GOALS: Target date: 04/13/22   Pt will be Ind in an initial HEP  Baseline: initiated Goal status: INITIAL   2.  Pt will voice understanding of measures to assist in pain reduction  Baseline: initiated Goal status: INITIAL   LONG TERM GOALS: Target date: 05/11/22   Pt will be Ind in a final HEP to maintain achieved LOF Baseline: initiated Goal status: INITIAL   2.  Increase trunk ROMs to min limitation for improved trunk function and as indication of improved pain Baseline: see flow sheets Goal status: INITIAL   3.  Pt will report a decrease for back and neck pain to 4/10 or less with daily  activities  Baseline: Back pain range 0-7/10. Neck pain range 0-6/10   4.  Pt's will demonstrate improved function per 30 sec STS reps increase to 12 with use of armrest Baseline: 6 reps Goal status: INITIAL   5.  Increase LE strength by at least half grades for improved lumbopelvic stability Baseline: see flow sheets Goal status: INITIAL  6.  Increase pt's cervical ROM by 10d for improved neck function.     PLAN:   PT FREQUENCY: 2x/week   PT DURATION: 6 weeks   PLANNED INTERVENTIONS: Therapeutic exercises, Therapeutic activity, Neuromuscular re-education, Balance training, Gait training, Patient/Family education, Self Care, Joint mobilization, Stair training, Dry Needling, Electrical stimulation, Spinal manipulation, Spinal mobilization, Cryotherapy, Moist heat, Taping, Traction, Ultrasound, Ionotophoresis 4mg /ml Dexamethasone, Manual therapy, and Re-evaluation.   PLAN FOR NEXT SESSION: Assess response to HEP; progress therex as indicated; use of modalities, manual therapy; and TPDN as indicated.   Ward Chatters PT  04/26/22 10:37 AM

## 2022-04-26 ENCOUNTER — Ambulatory Visit: Payer: BC Managed Care – PPO

## 2022-04-26 DIAGNOSIS — R252 Cramp and spasm: Secondary | ICD-10-CM

## 2022-04-26 DIAGNOSIS — M5459 Other low back pain: Secondary | ICD-10-CM | POA: Diagnosis not present

## 2022-04-26 DIAGNOSIS — M6281 Muscle weakness (generalized): Secondary | ICD-10-CM

## 2022-04-30 ENCOUNTER — Ambulatory Visit: Payer: BC Managed Care – PPO

## 2022-04-30 DIAGNOSIS — R252 Cramp and spasm: Secondary | ICD-10-CM

## 2022-04-30 DIAGNOSIS — M5459 Other low back pain: Secondary | ICD-10-CM | POA: Diagnosis not present

## 2022-04-30 DIAGNOSIS — M6281 Muscle weakness (generalized): Secondary | ICD-10-CM

## 2022-04-30 NOTE — Therapy (Signed)
OUTPATIENT PHYSICAL THERAPY TREATMENT NOTE   Patient Name: Tony Dean MRN: [id number] DOB:December 03, 1960, 62 y.o., male Today's Date: 04/30/2022  PCP: Kerin Perna, NP  REFERRING PROVIDER: Kerin Perna, NP   END OF SESSION:   PT End of Session - 04/30/22 0959     Visit Number 9    Number of Visits 17    Date for PT Re-Evaluation 05/11/22    Authorization Type BCBS    PT Start Time 1000    PT Stop Time 1040    PT Time Calculation (min) 40 min    Activity Tolerance Patient tolerated treatment well;Patient limited by pain    Behavior During Therapy [facility] for tasks assessed/performed                  Past Medical History:  Diagnosis Date   Abnormal electrocardiogram (ECG) (EKG)    Dehydration    Dizziness    DM (diabetes mellitus) ([place])    ED (erectile dysfunction)    Gout    Hypertension    Lymphangitis of groin    Scrotal pain    Sprain of left wrist    Urinary retention    Past Surgical History:  Procedure Laterality Date   APPENDECTOMY     INGUINAL HERNIA REPAIR  1990's   right   Patient Active Problem List   Diagnosis Date Noted   Right knee pain 12/20/2021   Sepsis ([place]) 12/19/2021   Joint swelling 12/19/2021   Hyperuricemia 05/12/2020   Hypertension 05/12/2020   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   Type 2 diabetes mellitus without complication, without long-term current use of insulin ([place]) 05/12/2020   Non compliance w medication regimen 05/12/2020   Alcoholism ([place]) 05/12/2020   Erectile disorder due to medical condition in male 05/12/2020   History of gout 05/12/2020   Other constipation 05/12/2020   Neuropathy 05/12/2020    REFERRING DIAG:  M54.2 (ICD-10-CM) - Cervical pain (neck)   M54.50 (ICD-10-CM) - Lumbar pain   THERAPY DIAG:  Other low back pain  Cramp and spasm  Muscle weakness (generalized)  Rationale for Evaluation and Treatment Rehabilitation  PERTINENT HISTORY: DM II  PRECAUTIONS: None  SUBJECTIVE:                                                                                                                                                                                       SUBJECTIVE STATEMENT: Pt presents to PT with reports of continued pain in lower back and neck. He has been compliant with HEP with continued reports of no adverse effect, although he notes some discomfort. Pt is ready to begin PT at this time.  PAIN:  Are you having pain?  Yes: NPRS scale: 7/10 Pain location: low back Pain description: intermittent, stiff, ache, sharp Aggravating factors: Being active, walking, standing Relieving factors: Resting Pain range on eval 0-7/10   Yes: NPRS scale: 7/10 Pain location: Neck Pain description: intermittent, stiff, ache sharp Aggravating factors: neck movements Relieving factors: Resting Pain range on eval 0-6/10   OBJECTIVE: (objective measures completed at initial evaluation unless otherwise dated)  DIAGNOSTIC FINDINGS:  MRI scheduled for 04/01/22   Xray Cervical 02/07/22 IMPRESSION: No acute abnormality.  Mild multilevel spondylosis.   Xray Lumbar 02/07/22 IMPRESSION: No acute abnormality.  Advanced lower lumbar facet arthropathy   PATIENT SURVEYS:  Not applicable   SCREENING FOR RED FLAGS: Bowel or bladder incontinence: No Spinal tumors: No Cauda equina syndrome: No Compression fracture: No   VITALS: BP: 111/80 - left arm in sitting                        SENSATION: WFL   MUSCLE LENGTH: Hamstrings: Right 34 deg; Left 30 deg Thomas test: Right NT deg; Left NT deg   POSTURE: rounded shoulders, forward head, decreased lumbar lordosis, increased thoracic kyphosis, flexed trunk , and knee valgus, foot supination, decreased pelvic rotation   PALPATION: TTP for the lumbar paraspinals, and bilat SI joint area   LUMBAR ROM:    AROM eval  Flexion Mod limitation, P  Extension Mod limitation, P  Right lateral flexion Mod limitation, P  Left  lateral flexion Mod limitation, P  Right rotation Min limitation, P  Left rotation Min limitation, P   (Blank rows = not tested)   LOWER EXTREMITY MMT:    Decreased core strength Passive  Right eval Left eval  Hip flexion 4 4  Hip extension 3 3  Hip abduction 4 4  Hip adduction      Hip internal rotation      Hip external rotation 3+ 3+  Knee flexion      Knee extension      Ankle dorsiflexion      Ankle plantarflexion      Ankle inversion      Ankle eversion       (Blank rows = not tested)   LOWER EXTREMITY ROM:     MMT Right eval Left eval  Hip flexion      Hip extension      Hip abduction      Hip adduction      Hip internal rotation      Hip external rotation      Knee flexion      Knee extension      Ankle dorsiflexion      Ankle plantarflexion      Ankle inversion      Ankle eversion       (Blank rows = not tested)   LUMBAR SPECIAL TESTS:  Straight leg raise test: Negative and Slump test: Negative   FUNCTIONAL TESTS:  30 seconds chair stand test= 6x with UEs   GAIT: Distance walked: 230ft Assistive device utilized: None Level of assistance: Complete Independence Comments: decreased pelvic rotation, supination, knee valgus  CERVICAL ROM:   Active ROM APROM (deg) eval  Flexion 21 P  Extension 29 P significant  Right lateral flexion 5 P significant  Left lateral flexion 15 P  Right rotation 12 P significant  Left rotation 21 P   (Blank rows = not tested)   CERVICAL SPECIAL TESTS:  Spurling's test: Negative  Shoulder MMT:    Both shoulders are 4+ to 5/5 and equal in strength  TREATMENT: [facility] Adult PT Treatment:                                                DATE: 04/30/2022 Therapeutic Exercise: NuStep lvl 6 UE/LE x 5 min while taking subjective  SLR 2x15  Bridge 3x10 Supine horizontal abd 2x15 BTB Supine clamshell 3x15 BTB Row 3x10 20# Seated knee ext 2x10 15# Seated hamstring curl 35# 2x10 STS 2x10 - high table Standing hip abd  2x10 25#  [facility] Adult PT Treatment:                                                DATE: 04/26/2022 Therapeutic Exercise: Rec bike lvl 2 x 2 min while taking subjective  Row 3x10 20# STS 2x10 - high table LAQ 2x10 4# LTR x 10 SLR 2x15 each Supine clamshell 3x15 BTB Supine march 2x20 BTB Bridge 3x10 Supine horizontal abd 2x15 BTB Standing hip abd/ext 2x10 RTB  [facility] Adult PT Treatment:                                                DATE: 04/24/2022 Therapeutic Exercise: Rec bike lvl 2 x 2 min while taking subjective  Row 3x10 20# LTR x 10 SLR 2x15 each Supine clamshell 3x15 GTB Supine march 2x20 GTB Bridge 3x10 Supine horizontal abd 2x15 GTB Supine chin tuck x 10 - difficult Seated bilateral ER 2x15 GTB Seated pilates ring squeeze 2x10 LAQ 3x10 3# STS x 10 - no UE  [facility] Adult PT Treatment:                                                DATE: 04/17/22 Therapeutic Exercise: NuStep lvl 5 UE/LE x 5 min while taking subjective  Row 3x10 17# LTR x 10 SLR 2x10 each Supine clamshell 3x15 GTB Supine march 2x20 GTB Bridge 3x10 Supine horizontal abd 2x15 GTB Supine chin tuck x 10 - difficult Seated bilateral ER 2x15 GTB Seated pilates ring squeeze 2x10 LAQ 2x10 3# STS 2x10 - no UE  [facility] Adult PT Treatment:                                                DATE: 04/11/22 Therapeutic Exercise: NuStep lvl 5 UE/LE x 5 min while taking subjective  Row 2x10 17# LTR x 10 SLR 2x10 each Supine clamshell 3x15 GTB Bridge 3x10 Supine horizontal abd 2x10 GTB Supine chin tuck x 10 - difficult Seated bilateral ER 2x10 GTB Seated pilates ring squeeze 2x10 LAQ 3x10 2.5# STS 2x10 - no UE  [facility] Adult PT Treatment:  DATE: 04/09/22 Therapeutic Exercise: NuStep lvl 5 UE/LE x 5 min while taking subjective  LTR x 10 SLR 2x10 each Supine clamshell 2x15 GTB Bridge 2x10 Supine horizontal abd 2x15 RTB Supine chin tuck x 10 - difficult Seated  bilateral ER 2x10 RTB LAQ 2x10 2# Standing row 2x10 GTB  PATIENT EDUCATION:  Education details: continue HEP Person educated: Patient Education method: Explanation, Demonstration, Tactile cues, Verbal cues, and Handouts Education comprehension: verbalized understanding, returned demonstration, verbal cues required, tactile cues required, and needs further education   HOME EXERCISE PROGRAM: Access Code: [facility] URL: [email or web address] Date: 04/04/2022 Prepared by: Joellyn Rued  Exercises - Supine Lower Trunk Rotation  - 2 x daily - 7 x weekly - 1 sets - 10 reps - 3 hold - Hooklying Single Knee to Chest Stretch  - 2 x daily - 7 x weekly - 1 sets - 5 reps - 5 hold - Supine Bridge  - 2 x daily - 7 x weekly - 1 sets - 10 reps - 5 hold - Seated Flexion Stretch with Swiss Ball  - 2 x daily - 7 x weekly - 1 sets - 5 reps - 10 hold - Seated Thoracic Flexion and Rotation with Swiss Ball  - 2 x daily - 7 x weekly - 1 sets - 5 reps - 10 hold - Standing Cervical Rotation AROM with Overpressure  - 1 x daily - 7 x weekly - 1 sets - 5 reps - 10 hold   ASSESSMENT:   CLINICAL IMPRESSION: Pt was able to complete all prescribed exercises with continued improvement in tolerance today. Therapy focused on core, hip, and periscapular strengthening in order to decrease back and neck pain. Pt continues to progres well but continues to fatigue and show difficulty with higher level strengthening. Will continue to progress as able per POC and assess some LTGs at next visit.   ACTIVITY LIMITATIONS: carrying, lifting, bending, standing, stairs, and locomotion level   PARTICIPATION LIMITATIONS: cleaning, laundry, shopping, and community activity   PERSONAL FACTORS: Fitness, Past/current experiences, and Time since onset of injury/illness/exacerbation are also affecting patient's functional outcome.      GOALS:   SHORT TERM GOALS: Target date: 04/13/22   Pt will be Ind in an initial HEP   Baseline: initiated Goal status: MET   2.  Pt will voice understanding of measures to assist in pain reduction  Baseline: initiated Goal status: MET   LONG TERM GOALS: Target date: 05/11/22   Pt will be Ind in a final HEP to maintain achieved LOF Baseline: initiated Goal status: INITIAL   2.  Increase trunk ROMs to min limitation for improved trunk function and as indication of improved pain Baseline: see flow sheets Goal status: INITIAL   3.  Pt will report a decrease for back and neck pain to 4/10 or less with daily activities  Baseline: Back pain range 0-7/10. Neck pain range 0-6/10   4.  Pt's will demonstrate improved function per 30 sec STS reps increase to 12 with use of armrest Baseline: 6 reps Goal status: INITIAL   5.  Increase LE strength by at least half grades for improved lumbopelvic stability Baseline: see flow sheets Goal status: INITIAL  6.  Increase pt's cervical ROM by 10d for improved neck function.     PLAN:   PT FREQUENCY: 2x/week   PT DURATION: 6 weeks   PLANNED INTERVENTIONS: Therapeutic exercises, Therapeutic activity, Neuromuscular re-education, Balance training, Gait training, Patient/Family education, Self Care, Joint mobilization, Stair training,  Dry Needling, Electrical stimulation, Spinal manipulation, Spinal mobilization, Cryotherapy, Moist heat, Taping, Traction, Ultrasound, Ionotophoresis 4mg /ml Dexamethasone, Manual therapy, and Re-evaluation.   PLAN FOR NEXT SESSION: Assess response to HEP; progress therex as indicated; use of modalities, manual therapy; and TPDN as indicated.   PT  04/30/22 11:35 AM

## 2022-05-01 NOTE — Therapy (Signed)
OUTPATIENT PHYSICAL THERAPY TREATMENT NOTE/Progress Note   Patient Name: Tony Dean MRN: [id number] DOB:1961/04/01, 62 y.o., male Today's Date: 05/03/2022  PCP: Grayce Sessions, NP  REFERRING PROVIDER: Grayce Sessions, NP   Progress Note Reporting Period 04/04/22 to 05/02/22  See note below for Objective Data and Assessment of Progress/Goals.      END OF SESSION:   PT End of Session - 05/02/22 1028     Visit Number 10    Number of Visits 17    Date for PT Re-Evaluation 05/11/22    Authorization Type BCBS    Progress Note Due on Visit 10    PT Start Time 1018    PT Stop Time 1100    PT Time Calculation (min) 42 min    Activity Tolerance Patient tolerated treatment well;Patient limited by pain    Behavior During Therapy WFL for tasks assessed/performed                  Past Medical History:  Diagnosis Date   Abnormal electrocardiogram (ECG) (EKG)    Dehydration    Dizziness    DM (diabetes mellitus) ([facility])    ED (erectile dysfunction)    Gout    Hypertension    Lymphangitis of groin    Scrotal pain    Sprain of left wrist    Urinary retention    Past Surgical History:  Procedure Laterality Date   APPENDECTOMY     INGUINAL HERNIA REPAIR  1990's   right   Patient Active Problem List   Diagnosis Date Noted   Right knee pain 12/20/2021   Sepsis ([facility]) 12/19/2021   Joint swelling 12/19/2021   Hyperuricemia 05/12/2020   Hypertension 05/12/2020   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   Type 2 diabetes mellitus without complication, without long-term current use of insulin ([facility]) 05/12/2020   Non compliance w medication regimen 05/12/2020   Alcoholism ([facility]) 05/12/2020   Erectile disorder due to medical condition in male 05/12/2020   History of gout 05/12/2020   Other constipation 05/12/2020   Neuropathy 05/12/2020    REFERRING DIAG:  M54.2 (ICD-10-CM) - Cervical pain (neck)   M54.50 (ICD-10-CM) - Lumbar pain   THERAPY DIAG:  Other low  back pain  Cramp and spasm  Muscle weakness (generalized)  Other abnormalities of gait and mobility  Difficulty in walking, not elsewhere classified  Cervicalgia  Abnormal posture  Rationale for Evaluation and Treatment Rehabilitation  PERTINENT HISTORY: DM II  PRECAUTIONS: None  SUBJECTIVE:  SUBJECTIVE STATEMENT: Pt reports he received an injection to his low back earlier this morning. He notes his neck and low back pain is the same  PAIN:  Are you having pain?  Yes: NPRS scale: 7/10 Pain location: low back Pain description: intermittent, stiff, ache, sharp Aggravating factors: Being active, walking, standing Relieving factors: Resting Pain range on eval 0-7/10   Yes: NPRS scale: 7/10 Pain location: Neck Pain description: intermittent, stiff, ache sharp Aggravating factors: neck movements Relieving factors: Resting Pain range on eval 0-6/10   OBJECTIVE: (objective measures completed at initial evaluation unless otherwise dated)  DIAGNOSTIC FINDINGS:  MRI scheduled for 04/01/22   Xray Cervical 02/07/22 IMPRESSION: No acute abnormality.  Mild multilevel spondylosis.   Xray Lumbar 02/07/22 IMPRESSION: No acute abnormality.  Advanced lower lumbar facet arthropathy   PATIENT SURVEYS:  Not applicable   SCREENING FOR RED FLAGS: Bowel or bladder incontinence: No Spinal tumors: No Cauda equina syndrome: No Compression fracture: No   VITALS: BP: 111/80 - left arm in sitting                        SENSATION: WFL   MUSCLE LENGTH: Hamstrings: Right 34 deg; Left 30 deg Thomas test: Right NT deg; Left NT deg   POSTURE: rounded shoulders, forward head, decreased lumbar lordosis, increased thoracic kyphosis, flexed trunk , and knee valgus, foot supination, decreased pelvic rotation    PALPATION: TTP for the lumbar paraspinals, and bilat SI joint area   LUMBAR ROM:    AROM eval  Flexion Mod limitation, P  Extension Mod limitation, P  Right lateral flexion Mod limitation, P  Left lateral flexion Mod limitation, P  Right rotation Min limitation, P  Left rotation Min limitation, P   (Blank rows = not tested)   LOWER EXTREMITY MMT:    Decreased core strength Passive  Right eval Left eval RT 05/02/22 LT 05/02/22  Hip flexion 4 4 5 5   Hip extension 3 3 4+ 4+  Hip abduction 4 4 5 5   Hip adduction        Hip internal rotation        Hip external rotation 3+ 3+ 5 5  Knee flexion        Knee extension        Ankle dorsiflexion        Ankle plantarflexion        Ankle inversion        Ankle eversion         (Blank rows = not tested)   LOWER EXTREMITY ROM:     MMT Right eval Left eval  Hip flexion      Hip extension      Hip abduction      Hip adduction      Hip internal rotation      Hip external rotation      Knee flexion      Knee extension      Ankle dorsiflexion      Ankle plantarflexion      Ankle inversion      Ankle eversion       (Blank rows = not tested)   LUMBAR SPECIAL TESTS:  Straight leg raise test: Negative and Slump test: Negative   FUNCTIONAL TESTS:  30 seconds chair stand test= 6x with Ues; 05/02/22=11x in 30"   GAIT: Distance walked: 265ft Assistive device utilized: None Level of assistance: Complete [place] Comments: decreased pelvic rotation, supination, knee valgus  CERVICAL ROM:   Active ROM APROM (deg) eval AROM 05/02/22  Flexion 21 P   Extension 29 P significant   Right lateral flexion 5 P significant 25  Left lateral flexion 15 P 25  Right rotation 12 P significant 50  Left rotation 21 P 50   (Blank rows = not tested)   CERVICAL SPECIAL TESTS:  Spurling's test: Negative   Shoulder MMT:    Both shoulders are 4+ to 5/5 and equal in strength  TREATMENT: [facility] Adult PT Treatment:                                                 DATE: 05/02/22 Therapeutic Exercise: NuStep lvl 6 UE/LE x 5 min while taking subjective  SLR 2x15  Bridge 3x10 LTR x10 each Supine Open Book x10 each Supine Cervical rotation x5 each Supine cervical chin tuck x10 Supine horizontal abd 2x15 BTB Supine clamshell 3x15 BTB Row 3x10 20# 30" STS MMT Cervical ROM  [facility] Adult PT Treatment:                                                DATE: 04/30/2022 Therapeutic Exercise: NuStep lvl 6 UE/LE x 5 min while taking subjective  SLR 2x15  Bridge 3x10 Supine horizontal abd 2x15 BTB Supine clamshell 3x15 BTB Row 3x10 20# Seated knee ext 2x10 15# Seated hamstring curl 35# 2x10 STS 2x10 - high table Standing hip abd 2x10 25#  [facility] Adult PT Treatment:                                                DATE: 04/26/2022 Therapeutic Exercise: Rec bike lvl 2 x 2 min while taking subjective  Row 3x10 20# STS 2x10 - high table LAQ 2x10 4# LTR x 10 SLR 2x15 each Supine clamshell 3x15 BTB Supine march 2x20 BTB Bridge 3x10 Supine horizontal abd 2x15 BTB Standing hip abd/ext 2x10 RTB Cervical ROM MMT LEs  PATIENT EDUCATION:  Education details: continue HEP Person educated: Patient Education method: Explanation, Demonstration, Tactile cues, Verbal cues, and Handouts Education comprehension: verbalized understanding, returned demonstration, verbal cues required, tactile cues required, and needs further education   HOME EXERCISE PROGRAM: Access Code: [facility] URL: [email or web address] Date: 04/04/2022 Prepared by: Gar Ponto  Exercises - Supine Lower Trunk Rotation  - 2 x daily - 7 x weekly - 1 sets - 10 reps - 3 hold - Hooklying Single Knee to Chest Stretch  - 2 x daily - 7 x weekly - 1 sets - 5 reps - 5 hold - Supine Bridge  - 2 x daily - 7 x weekly - 1 sets - 10 reps - 5 hold - Seated Flexion Stretch with Swiss Ball  - 2 x daily - 7 x weekly - 1 sets - 5 reps - 10 hold - Seated Thoracic Flexion and  Rotation with Swiss Ball  - 2 x daily - 7 x weekly - 1 sets - 5 reps - 10 hold - Standing Cervical Rotation AROM with Overpressure  - 1 x daily - 7 x weekly - 1 sets -  5 reps - 10 hold   ASSESSMENT:   CLINICAL IMPRESSION: PT was completed trunk, upper body and lower body strengthening, as well as trunk and cervical mobility. Pt tolerated PT today without adverse effects. Reassessment today revealed, increased bilat hip strength, increased cervical ROM, and an improved 30" STS reps. While pt reports pain levels have not improved, objective measures and observation of pt's quality of movement with therex have made good progress. Pt will continue to benefit from skilled PT to address impairments for improved function with less pain.   ACTIVITY LIMITATIONS: carrying, lifting, bending, standing, stairs, and locomotion level   PARTICIPATION LIMITATIONS: cleaning, laundry, shopping, and community activity   PERSONAL FACTORS: Fitness, Past/current experiences, and Time since onset of injury/illness/exacerbation are also affecting patient's functional outcome.      GOALS:   SHORT TERM GOALS: Target date: 04/13/22   Pt will be Ind in an initial HEP  Baseline: initiated Goal status: MET   2.  Pt will voice understanding of measures to assist in pain reduction  Baseline: initiated Goal status: MET   LONG TERM GOALS: Target date: 05/11/22   Pt will be Ind in a final HEP to maintain achieved LOF Baseline: initiated Goal status: Ongoing   2.  Increase trunk ROMs to min limitation for improved trunk function and as indication of improved pain Baseline: see flow sheets Goal status: Ongoing   3.  Pt will report a decrease for back and neck pain to 4/10 or less with daily activities  Baseline: Back pain range 0-7/10. Neck pain range 0-6/10 Status: 05/02/22= back 7/10; neck 7/10 Goal: Not met   4.  Pt's will demonstrate improved function per 30 sec STS reps increase to 12 with use of  armrest Baseline: 6 reps Status: 11 reps Goal status: Improved   5.  Increase LE strength by at least half grades for improved lumbopelvic stability Baseline: see flow sheets Status: see flow sheet Goal status: Met  6.  Increase pt's cervical ROM by 10d for improved neck function.  Baseline: see flow sheets Status: see flow sheet Goal status: Met   PLAN:   PT FREQUENCY: 2x/week   PT DURATION: 6 weeks   PLANNED INTERVENTIONS: Therapeutic exercises, Therapeutic activity, Neuromuscular re-education, Balance training, Gait training, Patient/Family education, Self Care, Joint mobilization, Stair training, Dry Needling, Electrical stimulation, Spinal manipulation, Spinal mobilization, Cryotherapy, Moist heat, Taping, Traction, Ultrasound, Ionotophoresis 4mg /ml Dexamethasone, Manual therapy, and Re-evaluation.   PLAN FOR NEXT SESSION: Assess response to HEP; progress therex as indicated; use of modalities, manual therapy; and TPDN as indicated.  Giara Mcgaughey MS, PT 05/03/22 5:52 AM

## 2022-05-02 ENCOUNTER — Ambulatory Visit: Payer: BC Managed Care – PPO

## 2022-05-02 DIAGNOSIS — R293 Abnormal posture: Secondary | ICD-10-CM

## 2022-05-02 DIAGNOSIS — R252 Cramp and spasm: Secondary | ICD-10-CM

## 2022-05-02 DIAGNOSIS — R262 Difficulty in walking, not elsewhere classified: Secondary | ICD-10-CM

## 2022-05-02 DIAGNOSIS — M542 Cervicalgia: Secondary | ICD-10-CM

## 2022-05-02 DIAGNOSIS — M5459 Other low back pain: Secondary | ICD-10-CM

## 2022-05-02 DIAGNOSIS — M6281 Muscle weakness (generalized): Secondary | ICD-10-CM

## 2022-05-02 DIAGNOSIS — R2689 Other abnormalities of gait and mobility: Secondary | ICD-10-CM

## 2022-05-09 ENCOUNTER — Ambulatory Visit: Payer: BC Managed Care – PPO

## 2022-05-09 DIAGNOSIS — M6281 Muscle weakness (generalized): Secondary | ICD-10-CM

## 2022-05-09 DIAGNOSIS — R252 Cramp and spasm: Secondary | ICD-10-CM

## 2022-05-09 DIAGNOSIS — M5459 Other low back pain: Secondary | ICD-10-CM | POA: Diagnosis not present

## 2022-05-09 NOTE — Therapy (Signed)
OUTPATIENT PHYSICAL THERAPY TREATMENT NOTE   Patient Name: Tony Dean MRN: [id number] DOB:07-Apr-1961, 62 y.o., male Today's Date: 05/09/2022  PCP: Grayce Sessions, NP  REFERRING PROVIDER: Grayce Sessions, NP    END OF SESSION:   PT End of Session - 05/09/22 1022     Visit Number 11    Number of Visits 17    Date for PT Re-Evaluation 05/11/22    Authorization Type BCBS    Progress Note Due on Visit 10    PT Start Time 1030    PT Stop Time 1108    PT Time Calculation (min) 38 min    Activity Tolerance Patient tolerated treatment well;Patient limited by pain    Behavior During Therapy WFL for tasks assessed/performed                  Past Medical History:  Diagnosis Date   Abnormal electrocardiogram (ECG) (EKG)    Dehydration    Dizziness    DM (diabetes mellitus) (HCC)    ED (erectile dysfunction)    Gout    Hypertension    Lymphangitis of groin    Scrotal pain    Sprain of left wrist    Urinary retention    Past Surgical History:  Procedure Laterality Date   APPENDECTOMY     INGUINAL HERNIA REPAIR  1990's   right   Patient Active Problem List   Diagnosis Date Noted   Right knee pain 12/20/2021   Sepsis (HCC) 12/19/2021   Joint swelling 12/19/2021   Hyperuricemia 05/12/2020   Hypertension 05/12/2020   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   Type 2 diabetes mellitus without complication, without long-term current use of insulin (HCC) 05/12/2020   Non compliance w medication regimen 05/12/2020   Alcoholism (HCC) 05/12/2020   Erectile disorder due to medical condition in male 05/12/2020   History of gout 05/12/2020   Other constipation 05/12/2020   Neuropathy 05/12/2020    REFERRING DIAG:  M54.2 (ICD-10-CM) - Cervical pain (neck)   M54.50 (ICD-10-CM) - Lumbar pain   THERAPY DIAG:  Other low back pain  Cramp and spasm  Muscle weakness (generalized)  Rationale for Evaluation and Treatment Rehabilitation  PERTINENT HISTORY: DM  II  PRECAUTIONS: None  SUBJECTIVE:                                                                                                                                                                                      SUBJECTIVE STATEMENT: Pt presents to PT with reports of continued neck and back pain. Has been compliant with HEP with no adverse effect. Pt is ready to begin PT at this time.  PAIN:  Are you having pain?  Yes: NPRS scale: 7/10 Pain location: low back Pain description: intermittent, stiff, ache, sharp Aggravating factors: Being active, walking, standing Relieving factors: Resting Pain range on eval 0-7/10   Yes: NPRS scale: 7/10 Pain location: Neck Pain description: intermittent, stiff, ache sharp Aggravating factors: neck movements Relieving factors: Resting Pain range on eval 0-6/10   OBJECTIVE: (objective measures completed at initial evaluation unless otherwise dated)  DIAGNOSTIC FINDINGS:  MRI scheduled for 04/01/22   Xray Cervical 02/07/22 IMPRESSION: No acute abnormality.  Mild multilevel spondylosis.   Xray Lumbar 02/07/22 IMPRESSION: No acute abnormality.  Advanced lower lumbar facet arthropathy   PATIENT SURVEYS:  Not applicable   SCREENING FOR RED FLAGS: Bowel or bladder incontinence: No Spinal tumors: No Cauda equina syndrome: No Compression fracture: No   VITALS: BP: 111/80 - left arm in sitting                        SENSATION: WFL   MUSCLE LENGTH: Hamstrings: Right 34 deg; Left 30 deg Thomas test: Right NT deg; Left NT deg   POSTURE: rounded shoulders, forward head, decreased lumbar lordosis, increased thoracic kyphosis, flexed trunk , and knee valgus, foot supination, decreased pelvic rotation   PALPATION: TTP for the lumbar paraspinals, and bilat SI joint area   LUMBAR ROM:    AROM eval  Flexion Mod limitation, P  Extension Mod limitation, P  Right lateral flexion Mod limitation, P  Left lateral flexion Mod limitation,  P  Right rotation Min limitation, P  Left rotation Min limitation, P   (Blank rows = not tested)   LOWER EXTREMITY MMT:    Decreased core strength Passive  Right eval Left eval RT 05/02/22 LT 05/02/22  Hip flexion 4 4 5 5   Hip extension 3 3 4+ 4+  Hip abduction 4 4 5 5   Hip adduction        Hip internal rotation        Hip external rotation 3+ 3+ 5 5  Knee flexion        Knee extension        Ankle dorsiflexion        Ankle plantarflexion        Ankle inversion        Ankle eversion         (Blank rows = not tested)   LOWER EXTREMITY ROM:     MMT Right eval Left eval  Hip flexion      Hip extension      Hip abduction      Hip adduction      Hip internal rotation      Hip external rotation      Knee flexion      Knee extension      Ankle dorsiflexion      Ankle plantarflexion      Ankle inversion      Ankle eversion       (Blank rows = not tested)   LUMBAR SPECIAL TESTS:  Straight leg raise test: Negative and Slump test: Negative   FUNCTIONAL TESTS:  30 seconds chair stand test= 6x with Ues; 05/02/22=11x in 30"   GAIT: Distance walked: 258ft Assistive device utilized: None Level of assistance: Complete Independence Comments: decreased pelvic rotation, supination, knee valgus  CERVICAL ROM:   Active ROM APROM (deg) eval AROM 05/02/22  Flexion 21 P   Extension 29 P significant   Right lateral flexion 5  P significant 25  Left lateral flexion 15 P 25  Right rotation 12 P significant 50  Left rotation 21 P 50   (Blank rows = not tested)   CERVICAL SPECIAL TESTS:  Spurling's test: Negative   Shoulder MMT:    Both shoulders are 4+ to 5/5 and equal in strength  TREATMENT: [facility] Adult PT Treatment:                                                DATE: 05/09/2022 Therapeutic Exercise: NuStep lvl 6 UE/LE x 5 min while taking subjective  Seated knee ext 3x10 15# Seated hamstring curl 35# 2x10 STS 2x10 - high table SLR 2x15  Bridge 3x15 Supine  horizontal abd 2x15 BTB Supine clamshell 3x15 BTB Row 3x10 20# Standing hip abd/ext 2x10 25#  [facility] Adult PT Treatment:                                                DATE: 05/02/22 Therapeutic Exercise: NuStep lvl 6 UE/LE x 5 min while taking subjective  SLR 2x15  Bridge 3x10 LTR x10 each Supine Open Book x10 each Supine Cervical rotation x5 each Supine cervical chin tuck x10 Supine horizontal abd 2x15 BTB Supine clamshell 3x15 BTB Row 3x10 20# 30" STS MMT Cervical ROM  [facility] Adult PT Treatment:                                                DATE: 04/30/2022 Therapeutic Exercise: NuStep lvl 6 UE/LE x 5 min while taking subjective  SLR 2x15  Bridge 3x10 Supine horizontal abd 2x15 BTB Supine clamshell 3x15 BTB Row 3x10 20# Seated knee ext 2x10 15# Seated hamstring curl 35# 2x10 STS 2x10 - high table Standing hip abd 2x10 25#  [facility] Adult PT Treatment:                                                DATE: 04/26/2022 Therapeutic Exercise: Rec bike lvl 2 x 2 min while taking subjective  Row 3x10 20# STS 2x10 - high table LAQ 2x10 4# LTR x 10 SLR 2x15 each Supine clamshell 3x15 BTB Supine march 2x20 BTB Bridge 3x10 Supine horizontal abd 2x15 BTB Standing hip abd/ext 2x10 RTB Cervical ROM MMT LEs  PATIENT EDUCATION:  Education details: continue HEP Person educated: Patient Education method: Explanation, Demonstration, Tactile cues, Verbal cues, and Handouts Education comprehension: verbalized understanding, returned demonstration, verbal cues required, tactile cues required, and needs further education   HOME EXERCISE PROGRAM: Access Code: [facility] URL: [email or web address] Date: 04/04/2022 Prepared by: Joellyn Rued  Exercises - Supine Lower Trunk Rotation  - 2 x daily - 7 x weekly - 1 sets - 10 reps - 3 hold - Hooklying Single Knee to Chest Stretch  - 2 x daily - 7 x weekly - 1 sets - 5 reps - 5 hold - Supine Bridge  - 2 x daily - 7 x weekly - 1 sets -  10 reps - 5 hold - Seated Flexion Stretch with Swiss Ball  - 2 x daily - 7 x weekly - 1 sets - 5 reps - 10 hold - Seated Thoracic Flexion and Rotation with Swiss Ball  - 2 x daily - 7 x weekly - 1 sets - 5 reps - 10 hold - Standing Cervical Rotation AROM with Overpressure  - 1 x daily - 7 x weekly - 1 sets - 5 reps - 10 hold   ASSESSMENT:   CLINICAL IMPRESSION: Pt was able to complete all prescribed exercises with continued improvement in tolerance today. Therapy focused on core, hip, and periscapular strengthening in order to decrease back and neck pain. Pt continues to progres well but continues to fatigue and show difficulty with higher level strengthening. Will continue to progress as able per POC and assess some LTGs at next visit.    ACTIVITY LIMITATIONS: carrying, lifting, bending, standing, stairs, and locomotion level   PARTICIPATION LIMITATIONS: cleaning, laundry, shopping, and community activity   PERSONAL FACTORS: Fitness, Past/current experiences, and Time since onset of injury/illness/exacerbation are also affecting patient's functional outcome.      GOALS:   SHORT TERM GOALS: Target date: 04/13/22   Pt will be Ind in an initial HEP  Baseline: initiated Goal status: MET   2.  Pt will voice understanding of measures to assist in pain reduction  Baseline: initiated Goal status: MET   LONG TERM GOALS: Target date: 05/11/22   Pt will be Ind in a final HEP to maintain achieved LOF Baseline: initiated Goal status: Ongoing   2.  Increase trunk ROMs to min limitation for improved trunk function and as indication of improved pain Baseline: see flow sheets Goal status: Ongoing   3.  Pt will report a decrease for back and neck pain to 4/10 or less with daily activities  Baseline: Back pain range 0-7/10. Neck pain range 0-6/10 Status: 05/02/22= back 7/10; neck 7/10 Goal: Not met   4.  Pt's will demonstrate improved function per 30 sec STS reps increase to 12 with use of  armrest Baseline: 6 reps Status: 11 reps Goal status: Improved   5.  Increase LE strength by at least half grades for improved lumbopelvic stability Baseline: see flow sheets Status: see flow sheet Goal status: Met  6.  Increase pt's cervical ROM by 10d for improved neck function.  Baseline: see flow sheets Status: see flow sheet Goal status: Met   PLAN:   PT FREQUENCY: 2x/week   PT DURATION: 6 weeks   PLANNED INTERVENTIONS: Therapeutic exercises, Therapeutic activity, Neuromuscular re-education, Balance training, Gait training, Patient/Family education, Self Care, Joint mobilization, Stair training, Dry Needling, Electrical stimulation, Spinal manipulation, Spinal mobilization, Cryotherapy, Moist heat, Taping, Traction, Ultrasound, Ionotophoresis 4mg /ml Dexamethasone, Manual therapy, and Re-evaluation.   PLAN FOR NEXT SESSION: Assess response to HEP; progress therex as indicated; use of modalities, manual therapy; and TPDN as indicated.   Ward Chatters PT  05/09/22 11:09 AM

## 2022-05-10 NOTE — Therapy (Addendum)
OUTPATIENT PHYSICAL THERAPY TREATMENT NOTE/Discharge   Patient Name: Tony Dean MRN: [id number] DOB:1960/05/14, 62 y.o., male Today's Date: 05/11/2022  PCP: Grayce Sessions, NP  REFERRING PROVIDER: Grayce Sessions, NP    END OF SESSION:   PT End of Session - 05/11/22 1027     Visit Number 12    Number of Visits --    Date for PT Re-Evaluation 05/11/22    Authorization Type BCBS    PT Start Time 1021    PT Stop Time 1100    PT Time Calculation (min) 39 min    Activity Tolerance Patient tolerated treatment well    Behavior During Therapy WFL for tasks assessed/performed                  Past Medical History:  Diagnosis Date   Abnormal electrocardiogram (ECG) (EKG)    Dehydration    Dizziness    DM (diabetes mellitus) (HCC)    ED (erectile dysfunction)    Gout    Hypertension    Lymphangitis of groin    Scrotal pain    Sprain of left wrist    Urinary retention    Past Surgical History:  Procedure Laterality Date   APPENDECTOMY     INGUINAL HERNIA REPAIR  1990's   right   Patient Active Problem List   Diagnosis Date Noted   Right knee pain 12/20/2021   Sepsis (HCC) 12/19/2021   Joint swelling 12/19/2021   Hyperuricemia 05/12/2020   Hypertension 05/12/2020   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   Type 2 diabetes mellitus without complication, without long-term current use of insulin (HCC) 05/12/2020   Non compliance w medication regimen 05/12/2020   Alcoholism (HCC) 05/12/2020   Erectile disorder due to medical condition in male 05/12/2020   History of gout 05/12/2020   Other constipation 05/12/2020   Neuropathy 05/12/2020    REFERRING DIAG:  M54.2 (ICD-10-CM) - Cervical pain (neck)   M54.50 (ICD-10-CM) - Lumbar pain   THERAPY DIAG:  Other low back pain  Cramp and spasm  Muscle weakness (generalized)  Cervicalgia  Difficulty in walking, not elsewhere classified  Rationale for Evaluation and Treatment  Rehabilitation  PERTINENT HISTORY: DM II  PRECAUTIONS: None  SUBJECTIVE:                                                                                                                                                                                      SUBJECTIVE STATEMENT: Pt reports is low back and neck pain remains at the same level.  PAIN:  Are you having pain?  Yes: NPRS scale: 7/10 Pain location: low back Pain description: intermittent, stiff, ache, sharp  Aggravating factors: Being active, walking, standing Relieving factors: Resting Pain range on eval 0-7/10   Yes: NPRS scale: 7/10 Pain location: Neck Pain description: intermittent, stiff, ache sharp Aggravating factors: neck movements Relieving factors: Resting Pain range on eval 0-6/10   OBJECTIVE: (objective measures completed at initial evaluation unless otherwise dated)  DIAGNOSTIC FINDINGS:  MRI scheduled for 04/01/22   Xray Cervical 02/07/22 IMPRESSION: No acute abnormality.  Mild multilevel spondylosis.   Xray Lumbar 02/07/22 IMPRESSION: No acute abnormality.  Advanced lower lumbar facet arthropathy   PATIENT SURVEYS:  Not applicable   SCREENING FOR RED FLAGS: Bowel or bladder incontinence: No Spinal tumors: No Cauda equina syndrome: No Compression fracture: No   VITALS: BP: 111/80 - left arm in sitting                        SENSATION: WFL   MUSCLE LENGTH: Hamstrings: Right 34 deg; Left 30 deg Thomas test: Right NT deg; Left NT deg   POSTURE: rounded shoulders, forward head, decreased lumbar lordosis, increased thoracic kyphosis, flexed trunk , and knee valgus, foot supination, decreased pelvic rotation   PALPATION: TTP for the lumbar paraspinals, and bilat SI joint area   LUMBAR ROM:    AROM eval 05/11/22  Flexion Mod limitation, P Min limitation  Extension Mod limitation, P Min limitation  Right lateral flexion Mod limitation, P Min limitation  Left lateral flexion Mod  limitation, P Min limitation  Right rotation Min limitation, P Min limitation  Left rotation Min limitation, P Min limitation   (Blank rows = not tested)   LOWER EXTREMITY MMT:    Decreased core strength Passive  Right eval Left eval RT 05/02/22 LT 05/02/22  Hip flexion 4 4 5 5   Hip extension 3 3 4+ 4+  Hip abduction 4 4 5 5   Hip adduction        Hip internal rotation        Hip external rotation 3+ 3+ 5 5  Knee flexion        Knee extension        Ankle dorsiflexion        Ankle plantarflexion        Ankle inversion        Ankle eversion         (Blank rows = not tested)   LOWER EXTREMITY ROM:     MMT Right eval Left eval  Hip flexion      Hip extension      Hip abduction      Hip adduction      Hip internal rotation      Hip external rotation      Knee flexion      Knee extension      Ankle dorsiflexion      Ankle plantarflexion      Ankle inversion      Ankle eversion       (Blank rows = not tested)   LUMBAR SPECIAL TESTS:  Straight leg raise test: Negative and Slump test: Negative   FUNCTIONAL TESTS:  30 seconds chair stand test= 6x with Ues; 05/02/22=11x in 30"   GAIT: Distance walked: 272ft Assistive device utilized: None Level of assistance: Complete Independence Comments: decreased pelvic rotation, supination, knee valgus  CERVICAL ROM:   Active ROM APROM (deg) eval AROM 05/02/22  Flexion 21 P   Extension 29 P significant   Right lateral flexion 5 P significant 25  Left lateral flexion 15  P 25  Right rotation 12 P significant 50  Left rotation 21 P 50   (Blank rows = not tested)   CERVICAL SPECIAL TESTS:  Spurling's test: Negative   Shoulder MMT:    Both shoulders are 4+ to 5/5 and equal in strength  TREATMENT: [facility] Adult PT Treatment:                                                DATE: 05/11/22 Therapeutic Exercise: NuStep lvl 6 UE/LE x 5 min while taking subjective  Seated knee ext 3x10 20# Seated hamstring curl 35# 2x10 SLR 2x15   Bridge 3x15 Supine horizontal abd 2x15 BTB Supine clamshell 3x15 BTB Row 3x10 20# STS 2x10 LTR x5 5" SKTC x3 10" Cervical rotation x3 15" Updated final HEP  [facility] Adult PT Treatment:                                                DATE: 05/09/2022 Therapeutic Exercise: NuStep lvl 6 UE/LE x 5 min while taking subjective  Seated knee ext 3x10 15# Seated hamstring curl 35# 2x10 STS 2x10 - high table SLR 2x15  Bridge 3x15 Supine horizontal abd 2x15 BTB Supine clamshell 3x15 BTB Row 3x10 20# Standing hip abd/ext 2x10 25#  [facility] Adult PT Treatment:                                                DATE: 05/02/22 Therapeutic Exercise: NuStep lvl 6 UE/LE x 5 min while taking subjective  SLR 2x15  Bridge 3x10 LTR x10 each Supine Open Book x10 each Supine Cervical rotation x5 each Supine cervical chin tuck x10 Supine horizontal abd 2x15 BTB Supine clamshell 3x15 BTB Row 3x10 20# 30" STS MMT Cervical ROM  PATIENT EDUCATION:  Education details: continue HEP Person educated: Patient Education method: Explanation, Demonstration, Tactile cues, Verbal cues, and Handouts Education comprehension: verbalized understanding, returned demonstration, verbal cues required, tactile cues required, and needs further education   HOME EXERCISE PROGRAM: Access Code: [facility] URL: [email or web address] Date: 05/11/2022 Prepared by: Gar Ponto  Exercises - Supine Lower Trunk Rotation  - 2 x daily - 7 x weekly - 1 sets - 10 reps - 3 hold - Hooklying Single Knee to Chest Stretch  - 2 x daily - 7 x weekly - 1 sets - 5 reps - 5 hold - Seated Flexion Stretch with Swiss Ball  - 2 x daily - 7 x weekly - 1 sets - 5 reps - 10 hold - Standing Cervical Rotation AROM with Overpressure  - 1 x daily - 7 x weekly - 1 sets - 5 reps - 10 hold - Supine Bridge  - 2 x daily - 7 x weekly - 1 sets - 10 reps - 5 hold - Supine Shoulder Horizontal Abduction with Resistance  - 1 x daily - 7 x weekly - 3 sets  - 10 reps - Hooklying Clamshell with Resistance  - 1 x daily - 7 x weekly - 3 sets - 10 reps   ASSESSMENT:   CLINICAL IMPRESSION: Pt completed his last PT visit  today. Pt has made good progress re: ROM, strength and functional mobility. Pt has met the majority of his PT goals. While the pt reports no change in his pain level, he is clearly demonstrating improve quality of movement with his neck and back. Pt has a HEP to help maintain his achieved LOF. Pt is in agreement with DC at this time..    ACTIVITY LIMITATIONS: carrying, lifting, bending, standing, stairs, and locomotion level   PARTICIPATION LIMITATIONS: cleaning, laundry, shopping, and community activity   PERSONAL FACTORS: Fitness, Past/current experiences, and Time since onset of injury/illness/exacerbation are also affecting patient's functional outcome.      GOALS:   SHORT TERM GOALS: Target date: 04/13/22   Pt will be Ind in an initial HEP  Baseline: initiated Goal status: MET   2.  Pt will voice understanding of measures to assist in pain reduction  Baseline: initiated Goal status: MET   LONG TERM GOALS: Target date: 05/11/22   Pt will be Ind in a final HEP to maintain achieved LOF Baseline: initiated Goal status: MET   2.  Increase trunk ROMs to min limitation for improved trunk function and as indication of improved pain Baseline: see flow sheets Goal status: MET   3.  Pt will report a decrease for back and neck pain to 4/10 or less with daily activities  Baseline: Back pain range 0-7/10. Neck pain range 0-6/10 Status: 05/02/22= back 7/10; neck 7/10 Goal: Not met   4.  Pt's will demonstrate improved function per 30 sec STS reps increase to 12 with use of armrest Baseline: 6 reps Status: 11 reps Goal status: Much improved   5.  Increase LE strength by at least half grades for improved lumbopelvic stability Baseline: see flow sheets Status: see flow sheet Goal status: Met  6.  Increase pt's cervical ROM  by 10d for improved neck function.  Baseline: see flow sheets Status: see flow sheet Goal status: Met   PLAN:   PT FREQUENCY: 2x/week   PT DURATION: 6 weeks   PLANNED INTERVENTIONS: Therapeutic exercises, Therapeutic activity, Neuromuscular re-education, Balance training, Gait training, Patient/Family education, Self Care, Joint mobilization, Stair training, Dry Needling, Electrical stimulation, Spinal manipulation, Spinal mobilization, Cryotherapy, Moist heat, Taping, Traction, Ultrasound, Ionotophoresis 4mg /ml Dexamethasone, Manual therapy, and Re-evaluation.   PLAN FOR NEXT SESSION:  PHYSICAL THERAPY DISCHARGE SUMMARY  Visits from Start of Care: 12  Current functional level related to goals / functional outcomes: See clinical impression and PT goals    Remaining deficits: See clinical impression and PT goals    Education / Equipment: HEP   Patient agrees to discharge. Patient goals were partially met. Patient is being discharged due to maximized rehab potential.    Gar Ponto MS, PT 05/11/22 11:44 AM

## 2022-05-11 ENCOUNTER — Ambulatory Visit: Payer: BC Managed Care – PPO

## 2022-05-11 DIAGNOSIS — M6281 Muscle weakness (generalized): Secondary | ICD-10-CM

## 2022-05-11 DIAGNOSIS — R262 Difficulty in walking, not elsewhere classified: Secondary | ICD-10-CM

## 2022-05-11 DIAGNOSIS — M542 Cervicalgia: Secondary | ICD-10-CM

## 2022-05-11 DIAGNOSIS — M5459 Other low back pain: Secondary | ICD-10-CM | POA: Diagnosis not present

## 2022-05-11 DIAGNOSIS — R252 Cramp and spasm: Secondary | ICD-10-CM

## 2022-05-20 ENCOUNTER — Other Ambulatory Visit (INDEPENDENT_AMBULATORY_CARE_PROVIDER_SITE_OTHER): Payer: Self-pay | Admitting: Primary Care

## 2022-05-20 DIAGNOSIS — E119 Type 2 diabetes mellitus without complications: Secondary | ICD-10-CM

## 2022-05-20 DIAGNOSIS — Z8739 Personal history of other diseases of the musculoskeletal system and connective tissue: Secondary | ICD-10-CM

## 2022-05-23 MED ORDER — ALLOPURINOL 100 MG PO TABS: 100.0000 mg | ORAL_TABLET | Freq: Every day | ORAL | 1 refills | Status: DC

## 2022-05-23 MED ORDER — METFORMIN HCL ER 500 MG PO TB24: 500.0000 mg | ORAL_TABLET | Freq: Every day | ORAL | 0 refills | Status: DC

## 2022-06-12 ENCOUNTER — Other Ambulatory Visit (INDEPENDENT_AMBULATORY_CARE_PROVIDER_SITE_OTHER): Payer: Self-pay | Admitting: Primary Care

## 2022-06-12 DIAGNOSIS — E119 Type 2 diabetes mellitus without complications: Secondary | ICD-10-CM

## 2022-06-21 MED ORDER — METFORMIN HCL ER 500 MG PO TB24: 500.0000 mg | ORAL_TABLET | Freq: Every day | ORAL | 1 refills | Status: DC

## 2022-06-26 ENCOUNTER — Encounter (INDEPENDENT_AMBULATORY_CARE_PROVIDER_SITE_OTHER): Payer: Self-pay | Admitting: Primary Care

## 2022-06-26 ENCOUNTER — Ambulatory Visit (INDEPENDENT_AMBULATORY_CARE_PROVIDER_SITE_OTHER): Payer: BC Managed Care – PPO | Admitting: Primary Care

## 2022-06-26 VITALS — BP 125/83 | HR 91 | Resp 16 | Ht 70.0 in | Wt 215.2 lb

## 2022-06-26 DIAGNOSIS — E119 Type 2 diabetes mellitus without complications: Secondary | ICD-10-CM | POA: Diagnosis not present

## 2022-06-26 DIAGNOSIS — E6609 Other obesity due to excess calories: Secondary | ICD-10-CM | POA: Diagnosis not present

## 2022-06-26 DIAGNOSIS — B351 Tinea unguium: Secondary | ICD-10-CM | POA: Diagnosis not present

## 2022-06-26 DIAGNOSIS — Z23 Encounter for immunization: Secondary | ICD-10-CM

## 2022-06-26 DIAGNOSIS — Z1211 Encounter for screening for malignant neoplasm of colon: Secondary | ICD-10-CM

## 2022-06-26 DIAGNOSIS — Z6830 Body mass index (BMI) 30.0-30.9, adult: Secondary | ICD-10-CM

## 2022-06-26 DIAGNOSIS — I10 Essential (primary) hypertension: Secondary | ICD-10-CM | POA: Diagnosis not present

## 2022-06-26 DIAGNOSIS — Z760 Encounter for issue of repeat prescription: Secondary | ICD-10-CM

## 2022-06-26 LAB — POCT GLYCOSYLATED HEMOGLOBIN (HGB A1C): HbA1c, POC (controlled diabetic range): 6 % (ref 0.0–7.0)

## 2022-06-26 MED ORDER — LOSARTAN POTASSIUM 25 MG PO TABS: 25.0000 mg | ORAL_TABLET | Freq: Every day | ORAL | 1 refills | Status: DC

## 2022-06-26 MED ORDER — CARVEDILOL 6.25 MG PO TABS: 6.2500 mg | ORAL_TABLET | Freq: Two times a day (BID) | ORAL | 1 refills | Status: DC

## 2022-06-26 MED ORDER — EMPAGLIFLOZIN 10 MG PO TABS: 10.0000 mg | ORAL_TABLET | Freq: Every day | ORAL | 1 refills | Status: DC

## 2022-06-26 MED ORDER — METFORMIN HCL ER 500 MG PO TB24: 500.0000 mg | ORAL_TABLET | Freq: Every day | ORAL | 1 refills | Status: DC

## 2022-06-26 MED ORDER — AMLODIPINE BESYLATE 10 MG PO TABS: 10.0000 mg | ORAL_TABLET | Freq: Every day | ORAL | 1 refills | Status: DC

## 2022-06-26 NOTE — Progress Notes (Unsigned)
Baker  Juliyan Woosley, is a 62 y.o. male  [phone]  [id number]  DOB - [birth year]  No chief complaint on file.      Subjective:   Mr. Izair Elwart is a 62 y.o. obese male here today with his sister Caren Griffins which the patient has given permission to be at his appt for a follow up visit for the management of Hypertension. Patient has No headache, No chest pain, No abdominal pain - No Nausea, No new weakness tingling or numbness, No Cough - shortness of breath. T2D- denies polyuria, polydipsia, polyphagia or vision changes.  No problems updated.  Allergies  Allergen Reactions   Lisinopril Cough    Past Medical History:  Diagnosis Date   Abnormal electrocardiogram (ECG) (EKG)    Dehydration    Dizziness    DM (diabetes mellitus) (HCC)    ED (erectile dysfunction)    Gout    Hypertension    Lymphangitis of groin    Scrotal pain    Sprain of left wrist    Urinary retention     Current Outpatient Medications on File Prior to Visit  Medication Sig Dispense Refill   allopurinol (ZYLOPRIM) 100 MG tablet Take 1 tablet (100 mg total) by mouth daily. 90 tablet 1   amLODipine (NORVASC) 10 MG tablet Take 1 tablet (10 mg total) by mouth daily. 90 tablet 1   atorvastatin (LIPITOR) 10 MG tablet Take 1 tablet (10 mg total) by mouth daily. 90 tablet 1   carvedilol (COREG) 6.25 MG tablet Take 1 tablet (6.25 mg total) by mouth 2 (two) times daily with a meal. 180 tablet 1   empagliflozin (JARDIANCE) 10 MG TABS tablet Take 1 tablet (10 mg total) by mouth daily before breakfast. 90 tablet 1   losartan (COZAAR) 25 MG tablet Take 1 tablet (25 mg total) by mouth daily. 90 tablet 1   metFORMIN (GLUCOPHAGE XR) 500 MG 24 hr tablet Take 1 tablet (500 mg total) by mouth daily with breakfast. 30 tablet 1   oxyCODONE (OXY IR/ROXICODONE) 5 MG immediate release tablet Take 1 tablet (5 mg total) by mouth every 4 (four) hours as needed for moderate pain. 30 tablet 0   No  current facility-administered medications on file prior to visit.    Objective:  Blood Pressure 125/83   Pulse 91   Respiration 16   Height '5\' 10"'$  (1.778 m)   Weight 215 lb 3.2 oz (97.6 kg)   Oxygen Saturation 98%   Body Mass Index 30.88 kg/m   Comprehensive ROS Pertinent positive and negative noted in HPI   Exam General appearance : Awake, alert, not in any distress. Speech Clear. Not toxic looking HEENT: Atraumatic and Normocephalic, pupils equally reactive to light and accomodation Neck: Supple, no JVD. No cervical lymphadenopathy.  Chest: Good air entry bilaterally, no added sounds  CVS: S1 S2 regular, no murmurs.  Abdomen: Bowel sounds present, Non tender and not distended with no gaurding, rigidity or rebound. Extremities: B/L Lower Ext shows no edema, both legs are warm to touch Neurology: Awake alert, and oriented , Non focal Skin: No Rash  Data Review Lab Results  Component Value Date   HGBA1C 6.7 (H) 12/23/2021   HGBA1C 6.2 (A) 09/14/2021   HGBA1C 6.7 (A) 06/01/2021    Assessment & Plan  Ezzard was seen today for hypertension and diabetes.  Diagnoses and all orders for this visit:  Type 2 diabetes mellitus without complication, without long-term current use of insulin ([place]) - educated  on lifestyle modifications, including but not limited to diet choices and adding exercise to daily routine.   -     POCT glycosylated hemoglobin (Hb A1C) -     Microalbumin / creatinine urine ratio -     empagliflozin (JARDIANCE) 10 MG TABS tablet; Take 1 tablet (10 mg total) by mouth daily before breakfast. -     metFORMIN (GLUCOPHAGE XR) 500 MG 24 hr tablet; Take 1 tablet (500 mg total) by mouth daily with breakfast.  Colon cancer screening -     Ambulatory referral to Gastroenterology  Comprehensive diabetic foot examination, type 2 DM, encounter for [facility]) completed  Need for immunization against influenza  Hypertension, unspecified type Well control BP goal - <  140/90 Explained that having normal blood pressure is the goal and medications are helping to get to goal and maintain normal blood pressure. DIET: Limit salt intake, read nutrition labels to check salt content, limit fried and high fatty foods  Avoid using multisymptom OTC cold preparations that generally contain sudafed which can rise BP. Consult with pharmacist on best cold relief products to use for persons with HTN EXERCISE Discussed incorporating exercise such as walking - 30 minutes most days of the week and can do in 10 minute intervals    -     Comprehensive metabolic panel -     Lipid panel -     amLODipine (NORVASC) 10 MG tablet; Take 1 tablet (10 mg total) by mouth daily. -     carvedilol (COREG) 6.25 MG tablet; Take 1 tablet (6.25 mg total) by mouth 2 (two) times daily with a meal. -     losartan (COZAAR) 25 MG tablet; Take 1 tablet (25 mg total) by mouth daily.  Medication refill -     amLODipine (NORVASC) 10 MG tablet; Take 1 tablet (10 mg total) by mouth daily. -     carvedilol (COREG) 6.25 MG tablet; Take 1 tablet (6.25 mg total) by mouth 2 (two) times daily with a meal. -     losartan (COZAAR) 25 MG tablet; Take 1 tablet (25 mg total) by mouth daily.  Class 1 obesity due to excess calories without serious comorbidity with body mass index (BMI) of 30.0 to 30.9 in adult  Onychomycosis -     Ambulatory referral to Podiatry    Patient have been counseled extensively about nutrition and exercise. Other issues discussed during this visit include: low cholesterol diet, weight control and daily exercise, foot care, annual eye examinations at Ophthalmology, importance of adherence with medications and regular follow-up. We also discussed long term complications of uncontrolled diabetes and hypertension.   No follow-ups on file.  The patient was given clear instructions to go to ER or return to medical center if symptoms don't improve, worsen or new problems develop. The patient  verbalized understanding. The patient was told to call to get lab results if they haven't heard anything in the next week.   This note has been created with Surveyor, quantity. Any transcriptional errors are unintentional.   Kerin Perna, NP 06/26/2022, 9:08 AM

## 2022-06-26 NOTE — Patient Instructions (Signed)
Calorie Counting for Weight Loss Calories are units of energy. Your body needs a certain number of calories from food to keep going throughout the day. When you eat or drink more calories than your body needs, your body stores the extra calories mostly as fat. When you eat or drink fewer calories than your body needs, your body burns fat to get the energy it needs. Calorie counting means keeping track of how many calories you eat and drink each day. Calorie counting can be helpful if you need to lose weight. If you eat fewer calories than your body needs, you should lose weight. Ask your health care provider what a healthy weight is for you. For calorie counting to work, you will need to eat the right number of calories each day to lose a healthy amount of weight per week. A dietitian can help you figure out how many calories you need in a day and will suggest ways to reach your calorie goal. A healthy amount of weight to lose each week is usually 1-2 lb (0.5-0.9 kg). This usually means that your daily calorie intake should be reduced by 500-750 calories. Eating 1,200-1,500 calories a day can help most women lose weight. Eating 1,500-1,800 calories a day can help most men lose weight. What do I need to know about calorie counting? Work with your health care provider or dietitian to determine how many calories you should get each day. To meet your daily calorie goal, you will need to: Find out how many calories are in each food that you would like to eat. Try to do this before you eat. Decide how much of the food you plan to eat. Keep a food log. Do this by writing down what you ate and how many calories it had. To successfully lose weight, it is important to balance calorie counting with a healthy lifestyle that includes regular activity. Where do I find calorie information?  The number of calories in a food can be found on a Nutrition Facts label. If a food does not have a Nutrition Facts label, try  to look up the calories online or ask your dietitian for help. Remember that calories are listed per serving. If you choose to have more than one serving of a food, you will have to multiply the calories per serving by the number of servings you plan to eat. For example, the label on a package of bread might say that a serving size is 1 slice and that there are 90 calories in a serving. If you eat 1 slice, you will have eaten 90 calories. If you eat 2 slices, you will have eaten 180 calories. How do I keep a food log? After each time that you eat, record the following in your food log as soon as possible: What you ate. Be sure to include toppings, sauces, and other extras on the food. How much you ate. This can be measured in cups, ounces, or number of items. How many calories were in each food and drink. The total number of calories in the food you ate. Keep your food log near you, such as in a pocket-sized notebook or on an app or website on your mobile phone. Some programs will calculate calories for you and show you how many calories you have left to meet your daily goal. What are some portion-control tips? Know how many calories are in a serving. This will help you know how many servings you can have of a certain   food. Use a measuring cup to measure serving sizes. You could also try weighing out portions on a kitchen scale. With time, you will be able to estimate serving sizes for some foods. Take time to put servings of different foods on your favorite plates or in your favorite bowls and cups so you know what a serving looks like. Try not to eat straight from a food's packaging, such as from a bag or box. Eating straight from the package makes it hard to see how much you are eating and can lead to overeating. Put the amount you would like to eat in a cup or on a plate to make sure you are eating the right portion. Use smaller plates, glasses, and bowls for smaller portions and to prevent  overeating. Try not to multitask. For example, avoid watching TV or using your computer while eating. If it is time to eat, sit down at a table and enjoy your food. This will help you recognize when you are full. It will also help you be more mindful of what and how much you are eating. What are tips for following this plan? Reading food labels Check the calorie count compared with the serving size. The serving size may be smaller than what you are used to eating. Check the source of the calories. Try to choose foods that are high in protein, fiber, and vitamins, and low in saturated fat, trans fat, and sodium. Shopping Read nutrition labels while you shop. This will help you make healthy decisions about which foods to buy. Pay attention to nutrition labels for low-fat or fat-free foods. These foods sometimes have the same number of calories or more calories than the full-fat versions. They also often have added sugar, starch, or salt to make up for flavor that was removed with the fat. Make a grocery list of lower-calorie foods and stick to it. Cooking Try to cook your favorite foods in a healthier way. For example, try baking instead of frying. Use low-fat dairy products. Meal planning Use more fruits and vegetables. One-half of your plate should be fruits and vegetables. Include lean proteins, such as chicken, turkey, and fish. Lifestyle Each week, aim to do one of the following: 150 minutes of moderate exercise, such as walking. 75 minutes of vigorous exercise, such as running. General information Know how many calories are in the foods you eat most often. This will help you calculate calorie counts faster. Find a way of tracking calories that works for you. Get creative. Try different apps or programs if writing down calories does not work for you. What foods should I eat?  Eat nutritious foods. It is better to have a nutritious, high-calorie food, such as an avocado, than a food with  few nutrients, such as a bag of potato chips. Use your calories on foods and drinks that will fill you up and will not leave you hungry soon after eating. Examples of foods that fill you up are nuts and nut butters, vegetables, lean proteins, and high-fiber foods such as whole grains. High-fiber foods are foods with more than 5 g of fiber per serving. Pay attention to calories in drinks. Low-calorie drinks include water and unsweetened drinks. The items listed above may not be a complete list of foods and beverages you can eat. Contact a dietitian for more information. What foods should I limit? Limit foods or drinks that are not good sources of vitamins, minerals, or protein or that are high in unhealthy fats. These   include: Candy. Other sweets. Sodas, specialty coffee drinks, alcohol, and juice. The items listed above may not be a complete list of foods and beverages you should avoid. Contact a dietitian for more information. How do I count calories when eating out? Pay attention to portions. Often, portions are much larger when eating out. Try these tips to keep portions smaller: Consider sharing a meal instead of getting your own. If you get your own meal, eat only half of it. Before you start eating, ask for a container and put half of your meal into it. When available, consider ordering smaller portions from the menu instead of full portions. Pay attention to your food and drink choices. Knowing the way food is cooked and what is included with the meal can help you eat fewer calories. If calories are listed on the menu, choose the lower-calorie options. Choose dishes that include vegetables, fruits, whole grains, low-fat dairy products, and lean proteins. Choose items that are boiled, broiled, grilled, or steamed. Avoid items that are buttered, battered, fried, or served with cream sauce. Items labeled as crispy are usually fried, unless stated otherwise. Choose water, low-fat milk,  unsweetened iced tea, or other drinks without added sugar. If you want an alcoholic beverage, choose a lower-calorie option, such as a glass of wine or light beer. Ask for dressings, sauces, and syrups on the side. These are usually high in calories, so you should limit the amount you eat. If you want a salad, choose a garden salad and ask for grilled meats. Avoid extra toppings such as bacon, cheese, or fried items. Ask for the dressing on the side, or ask for olive oil and vinegar or lemon to use as dressing. Estimate how many servings of a food you are given. Knowing serving sizes will help you be aware of how much food you are eating at restaurants. Where to find more information Centers for Disease Control and Prevention: www.cdc.gov U.[place]. Department of Agriculture: myplate.gov Summary Calorie counting means keeping track of how many calories you eat and drink each day. If you eat fewer calories than your body needs, you should lose weight. A healthy amount of weight to lose per week is usually 1-2 lb (0.5-0.9 kg). This usually means reducing your daily calorie intake by 500-750 calories. The number of calories in a food can be found on a Nutrition Facts label. If a food does not have a Nutrition Facts label, try to look up the calories online or ask your dietitian for help. Use smaller plates, glasses, and bowls for smaller portions and to prevent overeating. Use your calories on foods and drinks that will fill you up and not leave you hungry shortly after a meal. This information is not intended to replace advice given to you by your health care provider. Make sure you discuss any questions you have with your health care provider. Document Revised: 05/21/2019 Document Reviewed: 05/21/2019 Elsevier Patient Education  2023 Elsevier Inc.  

## 2022-06-27 LAB — COMPREHENSIVE METABOLIC PANEL
ALT: 16 IU/L (ref 0–44)
AST: 15 IU/L (ref 0–40)
Albumin/Globulin Ratio: 1.2 (ref 1.2–2.2)
Albumin: 4.3 g/dL (ref 3.9–4.9)
Alkaline Phosphatase: 130 IU/L — ABNORMAL HIGH (ref 44–121)
BUN/Creatinine Ratio: 14 (ref 10–24)
BUN: 26 mg/dL (ref 8–27)
Bilirubin Total: 0.5 mg/dL (ref 0.0–1.2)
CO2: 20 mmol/L (ref 20–29)
Calcium: 9.4 mg/dL (ref 8.6–10.2)
Chloride: 103 mmol/L (ref 96–106)
Creatinine, Ser: 1.87 mg/dL — ABNORMAL HIGH (ref 0.76–1.27)
Globulin, Total: 3.5 g/dL (ref 1.5–4.5)
Glucose: 158 mg/dL — ABNORMAL HIGH (ref 70–99)
Potassium: 4.4 mmol/L (ref 3.5–5.2)
Sodium: 140 mmol/L (ref 134–144)
Total Protein: 7.8 g/dL (ref 6.0–8.5)
eGFR: 40 mL/min/{1.73_m2} — ABNORMAL LOW (ref >59)

## 2022-06-27 LAB — LIPID PANEL
Chol/HDL Ratio: 2.2 ratio (ref 0.0–5.0)
Cholesterol, Total: 140 mg/dL (ref 100–199)
HDL: 64 mg/dL (ref >39)
LDL Chol Calc (NIH): 55 mg/dL (ref 0–99)
Triglycerides: 116 mg/dL (ref 0–149)
VLDL Cholesterol Cal: 21 mg/dL (ref 5–40)

## 2022-07-04 ENCOUNTER — Encounter (INDEPENDENT_AMBULATORY_CARE_PROVIDER_SITE_OTHER): Payer: Self-pay

## 2022-07-09 ENCOUNTER — Telehealth: Payer: Self-pay | Admitting: Emergency Medicine

## 2022-07-09 NOTE — Telephone Encounter (Signed)
Copied from [place] [id number]. Topic: General - Inquiry >> Jul 09, 2022 10:02 AM Tony Dean wrote: Reason for CRM: Pt and sister calling stated pt has been out on disability however, PCP advised them she cannot sign his paperwork because she is an NP. Sister stated the patient is eligible for disability pay and wants to know how to go about seeing if the PCP supervising provider can help and sign off on the patient's state disability forms.   Please advise.

## 2022-07-09 NOTE — Telephone Encounter (Signed)
Unfortunately that information is incorrect.  I spoke to PCP who states she informed them he does not meet criteria for disability.  He will have to discuss that with his PCP.

## 2022-07-09 NOTE — Telephone Encounter (Signed)
Are you able to complete this form for Tony Dean

## 2022-07-10 NOTE — Telephone Encounter (Signed)
Will forward to provider  

## 2022-07-11 ENCOUNTER — Telehealth: Payer: Self-pay | Admitting: Emergency Medicine

## 2022-07-11 NOTE — Telephone Encounter (Signed)
Copied from [place]. Topic: General - Other >> Jul 11, 2022  2:40 PM Everette C wrote: Reason for CRM: The patient's sister has called to request contact with a member of practice staff or possibly Dr. Margarita Rana directly regarding the patient's disability paperwork   Please contact further when possible

## 2022-07-13 NOTE — Telephone Encounter (Signed)
PCP will not sign off for disability for neuropathy this was told to him at his visit.

## 2022-09-26 ENCOUNTER — Encounter (INDEPENDENT_AMBULATORY_CARE_PROVIDER_SITE_OTHER): Payer: Self-pay | Admitting: Primary Care

## 2022-09-26 ENCOUNTER — Ambulatory Visit (INDEPENDENT_AMBULATORY_CARE_PROVIDER_SITE_OTHER): Payer: BC Managed Care – PPO | Admitting: Primary Care

## 2022-09-26 VITALS — BP 130/78 | HR 97 | Resp 16 | Ht 70.0 in | Wt 223.6 lb

## 2022-09-26 DIAGNOSIS — I152 Hypertension secondary to endocrine disorders: Secondary | ICD-10-CM

## 2022-09-26 DIAGNOSIS — E119 Type 2 diabetes mellitus without complications: Secondary | ICD-10-CM

## 2022-09-27 LAB — CMP14+EGFR
ALT: 19 IU/L (ref 0–44)
AST: 25 IU/L (ref 0–40)
Albumin/Globulin Ratio: 1.1 — ABNORMAL LOW (ref 1.2–2.2)
Albumin: 4.1 g/dL (ref 3.9–4.9)
Alkaline Phosphatase: 152 IU/L — ABNORMAL HIGH (ref 44–121)
BUN/Creatinine Ratio: 15 (ref 10–24)
BUN: 30 mg/dL — ABNORMAL HIGH (ref 8–27)
Bilirubin Total: 0.6 mg/dL (ref 0.0–1.2)
CO2: 23 mmol/L (ref 20–29)
Calcium: 9.6 mg/dL (ref 8.6–10.2)
Chloride: 103 mmol/L (ref 96–106)
Creatinine, Ser: 2.04 mg/dL — ABNORMAL HIGH (ref 0.76–1.27)
Globulin, Total: 3.7 g/dL (ref 1.5–4.5)
Glucose: 158 mg/dL — ABNORMAL HIGH (ref 70–99)
Potassium: 4.9 mmol/L (ref 3.5–5.2)
Sodium: 141 mmol/L (ref 134–144)
Total Protein: 7.8 g/dL (ref 6.0–8.5)
eGFR: 36 mL/min/{1.73_m2} — ABNORMAL LOW (ref >59)

## 2022-09-30 NOTE — Progress Notes (Signed)
[facility]  Tony Dean, is a 62 y.o. male  [id number]  [id number]  DOB - 22-May-1960  Chief Complaint  Patient presents with   Diabetes   Hypertension       Subjective:   Tony Dean is a 62 y.o. male here today for a follow up visit HTN. Patient has No headache, No chest pain, No abdominal pain - No Nausea, No new weakness tingling or numbness, No Cough - shortness of breath. Management of diabetes. Denies polyuria , polydipsia, polyphagia or vision changes    No problems updated.  Allergies  Allergen Reactions   Lisinopril Cough    Past Medical History:  Diagnosis Date   Abnormal electrocardiogram (ECG) (EKG)    Dehydration    Dizziness    DM (diabetes mellitus) (HCC)    ED (erectile dysfunction)    Gout    Hypertension    Lymphangitis of groin    Scrotal pain    Sprain of left wrist    Urinary retention     Current Outpatient Medications on File Prior to Visit  Medication Sig Dispense Refill   allopurinol (ZYLOPRIM) 100 MG tablet Take 1 tablet (100 mg total) by mouth daily. 90 tablet 1   amLODipine (NORVASC) 10 MG tablet Take 1 tablet (10 mg total) by mouth daily. 90 tablet 1   atorvastatin (LIPITOR) 10 MG tablet Take 1 tablet (10 mg total) by mouth daily. 90 tablet 1   carvedilol (COREG) 6.25 MG tablet Take 1 tablet (6.25 mg total) by mouth 2 (two) times daily with a meal. 180 tablet 1   empagliflozin (JARDIANCE) 10 MG TABS tablet Take 1 tablet (10 mg total) by mouth daily before breakfast. 90 tablet 1   losartan (COZAAR) 25 MG tablet Take 1 tablet (25 mg total) by mouth daily. 90 tablet 1   metFORMIN (GLUCOPHAGE XR) 500 MG 24 hr tablet Take 1 tablet (500 mg total) by mouth daily with breakfast. 90 tablet 1   oxyCODONE (OXY IR/ROXICODONE) 5 MG immediate release tablet Take 1 tablet (5 mg total) by mouth every 4 (four) hours as needed for moderate pain. 30 tablet 0   No current facility-administered medications on file prior to  visit.    Objective:   Vitals:   09/26/22 0947 09/26/22 0948 09/26/22 1016  BP: (Abnormal) 134/92 (Abnormal) 145/95 130/78  Pulse: 97    Resp: 16    SpO2: 97%    Weight: 223 lb 9.6 oz (101.4 kg)    Height: 5\' 10"  (1.778 m)      Comprehensive ROS Pertinent positive and negative noted in HPI   Exam General appearance : Awake, alert, not in any distress. Speech Clear. Not toxic looking HEENT: Atraumatic and Normocephalic, pupils equally reactive to light and accomodation Neck: Supple, no JVD. No cervical lymphadenopathy.  Chest: Good air entry bilaterally, no added sounds  CVS: S1 S2 regular, no murmurs.  Abdomen: Bowel sounds present, Non tender and not distended with no gaurding, rigidity or rebound. Extremities: B/L Lower Ext shows no edema, both legs are warm to touch Neurology: Awake alert, and oriented X 3, CN II-XII intact, Non focal Skin: No Rash  Data Review Lab Results  Component Value Date   HGBA1C 6.0 06/26/2022   HGBA1C 6.7 (H) 12/23/2021   HGBA1C 6.2 (A) 09/14/2021    Assessment & Plan   Tony Dean was seen today for diabetes and hypertension.  Diagnoses and all orders for this visit:  Type 2 diabetes mellitus without complication, without  long-term current use of insulin ([facility]CC) - educated on lifestyle modifications, including but not limited to diet choices and adding exercise to daily routine.   -     Cancel: POCT glycosylated hemoglobin (Hb A1C)  Hypertension due to endocrine disorder BP goal - is met < 130/80 Explained that having normal blood pressure is the goal and medications are helping to get to goal and maintain normal blood pressure. DIET: Limit salt intake, read nutrition labels to check salt content, limit fried and high fatty foods  Avoid using multisymptom OTC cold preparations that generally contain sudafed which can rise BP. Consult with pharmacist on best cold relief products to use for persons with HTN EXERCISE Discussed incorporating  exercise such as walking - 30 minutes most days of the week and can do in 10 minute intervals    -     [id number]     Patient have been counseled extensively about nutrition and exercise. Other issues discussed during this visit include: low cholesterol diet, weight control and daily exercise, foot care, annual eye examinations at Ophthalmology, importance of adherence with medications and regular follow-up. We also discussed long term complications of uncontrolled diabetes and hypertension.   Return in about 3 months (around 12/27/2022) for fasting /HTN/GM.  The patient was given clear instructions to go to ER or return to medical center if symptoms don't improve, worsen or new problems develop. The patient verbalized understanding. The patient was told to call to get lab results if they haven't heard anything in the next week.   This note has been created with Education officer, environmental. Any transcriptional errors are unintentional.   Grayce Sessions, NP 09/30/2022, 7:53 PM

## 2022-10-15 ENCOUNTER — Other Ambulatory Visit (INDEPENDENT_AMBULATORY_CARE_PROVIDER_SITE_OTHER): Payer: Self-pay | Admitting: Primary Care

## 2022-10-15 DIAGNOSIS — E7841 Elevated Lipoprotein(a): Secondary | ICD-10-CM

## 2022-12-24 ENCOUNTER — Other Ambulatory Visit (INDEPENDENT_AMBULATORY_CARE_PROVIDER_SITE_OTHER): Payer: Self-pay | Admitting: Primary Care

## 2022-12-24 DIAGNOSIS — Z760 Encounter for issue of repeat prescription: Secondary | ICD-10-CM

## 2022-12-24 DIAGNOSIS — I10 Essential (primary) hypertension: Secondary | ICD-10-CM

## 2022-12-27 ENCOUNTER — Ambulatory Visit (INDEPENDENT_AMBULATORY_CARE_PROVIDER_SITE_OTHER): Payer: BC Managed Care – PPO | Admitting: Primary Care

## 2022-12-27 ENCOUNTER — Encounter (INDEPENDENT_AMBULATORY_CARE_PROVIDER_SITE_OTHER): Payer: Self-pay | Admitting: Primary Care

## 2022-12-27 VITALS — BP 148/92 | HR 93 | Resp 16 | Wt 223.8 lb

## 2022-12-27 DIAGNOSIS — I10 Essential (primary) hypertension: Secondary | ICD-10-CM | POA: Diagnosis not present

## 2022-12-27 DIAGNOSIS — E7841 Elevated Lipoprotein(a): Secondary | ICD-10-CM

## 2022-12-27 DIAGNOSIS — E119 Type 2 diabetes mellitus without complications: Secondary | ICD-10-CM

## 2022-12-27 DIAGNOSIS — Z1211 Encounter for screening for malignant neoplasm of colon: Secondary | ICD-10-CM

## 2022-12-27 LAB — POCT GLYCOSYLATED HEMOGLOBIN (HGB A1C): HbA1c, POC (controlled diabetic range): 6.1 % (ref 0.0–7.0)

## 2022-12-27 NOTE — Progress Notes (Signed)
[facility]  Tony Dean, is a 62 y.o. male  UEA:[id number]  YNW:[id number]  DOB - Oct 06, 1960  Chief Complaint  Patient presents with   Diabetes   Hypertension       Subjective:   Tony Dean is a 62 y.o. male here today for a follow up visit for the management of hypertension . Patient has No headache, No chest pain, No abdominal pain - No Nausea, No new weakness tingling or numbness, No Cough - shortness of breath. T2D-Denies polyuria, polydipsia, polyphasia or vision changes.  Does not check blood sugars at home. His main concern is ED explained with Bp elevated will not refill. He admits not taking his medication correctly. He works from 2-11pm and does not take his second dose of Coreg. Agreed to take medication when he gets home.  No problems updated.  Allergies  Allergen Reactions   Lisinopril Cough    Past Medical History:  Diagnosis Date   Abnormal electrocardiogram (ECG) (EKG)    Dehydration    Dizziness    DM (diabetes mellitus) (HCC)    ED (erectile dysfunction)    Gout    Hypertension    Lymphangitis of groin    Scrotal pain    Sprain of left wrist    Urinary retention     Current Outpatient Medications on File Prior to Visit  Medication Sig Dispense Refill   allopurinol (ZYLOPRIM) 100 MG tablet Take 1 tablet (100 mg total) by mouth daily. 90 tablet 1   amLODipine (NORVASC) 10 MG tablet TAKE 1 TABLET BY MOUTH DAILY 90 tablet 1   atorvastatin (LIPITOR) 10 MG tablet TAKE 1 TABLET BY MOUTH DAILY 90 tablet 1   carvedilol (COREG) 6.25 MG tablet Take 1 tablet (6.25 mg total) by mouth 2 (two) times daily with a meal. 180 tablet 1   losartan (COZAAR) 25 MG tablet TAKE 1 TABLET BY MOUTH DAILY 90 tablet 1   oxyCODONE (OXY IR/ROXICODONE) 5 MG immediate release tablet Take 1 tablet (5 mg total) by mouth every 4 (four) hours as needed for moderate pain. 30 tablet 0   No current facility-administered medications on file prior to visit.     Objective:   Vitals:   12/27/22 0947 12/27/22 0949  BP: (!) 146/101 (!) 145/102  Pulse: 93   Resp: 16   SpO2: 96%   Weight: 223 lb 12.8 oz (101.5 kg)     Comprehensive ROS Pertinent positive and negative noted in HPI   Exam General appearance : Awake, alert, not in any distress. Speech Clear. Not toxic looking HEENT: Atraumatic and Normocephalic, pupils equally reactive to light and accomodation Neck: Supple, no JVD. No cervical lymphadenopathy.  Chest: Good air entry bilaterally, no added sounds  CVS: S1 S2 regular, no murmurs.  Abdomen: Bowel sounds present, Non tender and not distended with no gaurding, rigidity or rebound. Extremities: B/L Lower Ext shows no edema, both legs are warm to touch Neurology: Awake alert, and oriented X 3, CN II-XII intact, Non focal Skin: No Rash  Data Review Lab Results  Component Value Date   HGBA1C 6.1 12/27/2022   HGBA1C 6.0 06/26/2022   HGBA1C 6.7 (H) 12/23/2021    Assessment & Plan   [name] was seen today for diabetes and hypertension.  Diagnoses and all orders for this visit:  Type 2 diabetes mellitus without complication, without long-term current use of insulin (HCC) -     POCT glycosylated hemoglobin (Hb A1C) 6.1  Well controlled - educated on lifestyle  modifications, including but not limited to diet choices and adding exercise to daily routine.    Hypertension, unspecified type BP goal - < 140/90 Explained that having normal blood pressure is the goal and medications are helping to get to goal and maintain normal blood pressure. DIET: Limit salt intake, read nutrition labels to check salt content, limit fried and high fatty foods  Avoid using multisymptom OTC cold preparations that generally contain sudafed which can rise BP. 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 Other issues discussed during this visit include: low cholesterol diet, weight control and daily exercise, foot care, annual eye examinations at Ophthalmology, importance of adherence with medications and regular follow-up. We also discussed long term complications of uncontrolled diabetes and hypertension.   Return in about 3 weeks (around 01/17/2023) for BP ck only .  The patient was given clear instructions to go to ER or return to medical center if symptoms don't improve, worsen or new problems develop. The patient verbalized understanding. The patient was told to call to get lab results if they haven't heard anything in the next week.   This note has been created with Education officer, environmental. Any transcriptional errors are unintentional.   Grayce Sessions, NP 12/27/2022, 11:01 AM

## 2022-12-27 NOTE — Patient Instructions (Addendum)
 Calorie Counting for Weight Loss Calories are units of energy. Your body needs a certain number of calories from food to keep going throughout the day. When you eat or drink more calories than your body needs, your body stores the extra calories mostly as fat. When you eat or drink fewer calories than your body needs, your body burns fat to get the energy it needs. Calorie counting means keeping track of how many calories you eat and drink each day. Calorie counting can be helpful if you need to lose weight. If you eat fewer calories than your body needs, you should lose weight. Ask your health care provider what a healthy weight is for you. For calorie counting to work, you will need to eat the right number of calories each day to lose a healthy amount of weight per week. A dietitian can help you figure out how many calories you need in a day and will suggest ways to reach your calorie goal. A healthy amount of weight to lose each week is usually 1-2 lb (0.5-0.9 kg). This usually means that your daily calorie intake should be reduced by 500-750 calories. Eating 1,200-1,500 calories a day can help most women lose weight. Eating 1,500-1,800 calories a day can help most men lose weight. What do I need to know about calorie counting? Work with your health care provider or dietitian to determine how many calories you should get each day. To meet your daily calorie goal, you will need to: Find out how many calories are in each food that you would like to eat. Try to do this before you eat. Decide how much of the food you plan to eat. Keep a food log. Do this by writing down what you ate and how many calories it had. To successfully lose weight, it is important to balance calorie counting with a healthy lifestyle that includes regular activity. Where do I find calorie information?  The number of calories in a food can be found on a Nutrition Facts label. If a food does not have a Nutrition Facts label, try  to look up the calories online or ask your dietitian for help. Remember that calories are listed per serving. If you choose to have more than one serving of a food, you will have to multiply the calories per serving by the number of servings you plan to eat. For example, the label on a package of bread might say that a serving size is 1 slice and that there are 90 calories in a serving. If you eat 1 slice, you will have eaten 90 calories. If you eat 2 slices, you will have eaten 180 calories. How do I keep a food log? After each time that you eat, record the following in your food log as soon as possible: What you ate. Be sure to include toppings, sauces, and other extras on the food. How much you ate. This can be measured in cups, ounces, or number of items. How many calories were in each food and drink. The total number of calories in the food you ate. Keep your food log near you, such as in a pocket-sized notebook or on an app or website on your mobile phone. Some programs will calculate calories for you and show you how many calories you have left to meet your daily goal. What are some portion-control tips? Know how many calories are in a serving. This will help you know how many servings you can have of a certain  food. Use a measuring cup to measure serving sizes. You could also try weighing out portions on a kitchen scale. With time, you will be able to estimate serving sizes for some foods. Take time to put servings of different foods on your favorite plates or in your favorite bowls and cups so you know what a serving looks like. Try not to eat straight from a food's packaging, such as from a bag or box. Eating straight from the package makes it hard to see how much you are eating and can lead to overeating. Put the amount you would like to eat in a cup or on a plate to make sure you are eating the right portion. Use smaller plates, glasses, and bowls for smaller portions and to prevent  overeating. Try not to multitask. For example, avoid watching TV or using your computer while eating. If it is time to eat, sit down at a table and enjoy your food. This will help you recognize when you are full. It will also help you be more mindful of what and how much you are eating. What are tips for following this plan? Reading food labels Check the calorie count compared with the serving size. The serving size may be smaller than what you are used to eating. Check the source of the calories. Try to choose foods that are high in protein, fiber, and vitamins, and low in saturated fat, trans fat, and sodium. Shopping Read nutrition labels while you shop. This will help you make healthy decisions about which foods to buy. Pay attention to nutrition labels for low-fat or fat-free foods. These foods sometimes have the same number of calories or more calories than the full-fat versions. They also often have added sugar, starch, or salt to make up for flavor that was removed with the fat. Make a grocery list of lower-calorie foods and stick to it. Cooking Try to cook your favorite foods in a healthier way. For example, try baking instead of frying. Use low-fat dairy products. Meal planning Use more fruits and vegetables. One-half of your plate should be fruits and vegetables. Include lean proteins, such as chicken, [place], and fish. Lifestyle Each week, aim to do one of the following: 150 minutes of moderate exercise, such as walking. 75 minutes of vigorous exercise, such as running. General information Know how many calories are in the foods you eat most often. This will help you calculate calorie counts faster. Find a way of tracking calories that works for you. Get creative. Try different apps or programs if writing down calories does not work for you. What foods should I eat?  Eat nutritious foods. It is better to have a nutritious, high-calorie food, such as an avocado, than a food with  few nutrients, such as a bag of potato chips. Use your calories on foods and drinks that will fill you up and will not leave you hungry soon after eating. Examples of foods that fill you up are nuts and nut butters, vegetables, lean proteins, and high-fiber foods such as whole grains. High-fiber foods are foods with more than 5 g of fiber per serving. Pay attention to calories in drinks. Low-calorie drinks include water and unsweetened drinks. The items listed above may not be a complete list of foods and beverages you can eat. Contact a dietitian for more information. What foods should I limit? Limit foods or drinks that are not good sources of vitamins, minerals, or protein or that are high in unhealthy fats. These  include: Candy. Other sweets. Sodas, specialty coffee drinks, alcohol, and juice. The items listed above may not be a complete list of foods and beverages you should avoid. Contact a dietitian for more information. How do I count calories when eating out? Pay attention to portions. Often, portions are much larger when eating out. Try these tips to keep portions smaller: Consider sharing a meal instead of getting your own. If you get your own meal, eat only half of it. Before you start eating, ask for a container and put half of your meal into it. When available, consider ordering smaller portions from the menu instead of full portions. Pay attention to your food and drink choices. Knowing the way food is cooked and what is included with the meal can help you eat fewer calories. If calories are listed on the menu, choose the lower-calorie options. Choose dishes that include vegetables, fruits, whole grains, low-fat dairy products, and lean proteins. Choose items that are boiled, broiled, grilled, or steamed. Avoid items that are buttered, battered, fried, or served with cream sauce. Items labeled as crispy are usually fried, unless stated otherwise. Choose water, low-fat milk,  unsweetened iced tea, or other drinks without added sugar. If you want an alcoholic beverage, choose a lower-calorie option, such as a glass of wine or light beer. Ask for dressings, sauces, and syrups on the side. These are usually high in calories, so you should limit the amount you eat. If you want a salad, choose a garden salad and ask for grilled meats. Avoid extra toppings such as bacon, cheese, or fried items. Ask for the dressing on the side, or ask for olive oil and vinegar or lemon to use as dressing. Estimate how many servings of a food you are given. Knowing serving sizes will help you be aware of how much food you are eating at restaurants. Where to find more information Centers for Disease Control and Prevention: FootballExhibition.com.br [place] Department of Agriculture: WrestlingReporter.dk Summary Calorie counting means keeping track of how many calories you eat and drink each day. If you eat fewer calories than your body needs, you should lose weight. A healthy amount of weight to lose per week is usually 1-2 lb (0.5-0.9 kg). This usually means reducing your daily calorie intake by 500-750 calories. The number of calories in a food can be found on a Nutrition Facts label. If a food does not have a Nutrition Facts label, try to look up the calories online or ask your dietitian for help. Use smaller plates, glasses, and bowls for smaller portions and to prevent overeating. Use your calories on foods and drinks that will fill you up and not leave you hungry shortly after a meal. This information is not intended to replace advice given to you by your health care provider. Make sure you discuss any questions you have with your health care provider. Document Revised: 05/21/2019 Document Reviewed: 05/21/2019 Elsevier Patient Education  2023 [employer] Hypertension, Adult Hypertension is another name for high blood pressure. High blood pressure forces your heart to work harder to pump blood. This can cause  problems over time. There are two numbers in a blood pressure reading. There is a top number (systolic) over a bottom number (diastolic). It is best to have a blood pressure that is below 120/80. What are the causes? The cause of this condition is not known. Some other conditions can lead to high blood pressure. What increases the risk? Some lifestyle factors can make you more likely  to develop high blood pressure: Smoking. Not getting enough exercise or physical activity. Being overweight. Having too much fat, sugar, calories, or salt (sodium) in your diet. Drinking too much alcohol. Other risk factors include: Having any of these conditions: Heart disease. Diabetes. High cholesterol. Kidney disease. Obstructive sleep apnea. Having a family history of high blood pressure and high cholesterol. Age. The risk increases with age. Stress. What are the signs or symptoms? High blood pressure may not cause symptoms. Very high blood pressure (hypertensive crisis) may cause: Headache. Fast or uneven heartbeats (palpitations). Shortness of breath. Nosebleed. Vomiting or feeling like you may vomit (nauseous). Changes in how you see. Very bad chest pain. Feeling dizzy. Seizures. How is this treated? This condition is treated by making healthy lifestyle changes, such as: Eating healthy foods. Exercising more. Drinking less alcohol. Your doctor may prescribe medicine if lifestyle changes do not help enough and if: Your top number is above 130. Your bottom number is above 80. Your personal target blood pressure may vary. Follow these instructions at home: Eating and drinking  If told, follow the DASH eating plan. To follow this plan: Fill one half of your plate at each meal with fruits and vegetables. Fill one fourth of your plate at each meal with whole grains. Whole grains include whole-wheat pasta, brown rice, and whole-grain bread. Eat or drink low-fat dairy products, such as skim  milk or low-fat yogurt. Fill one fourth of your plate at each meal with low-fat (lean) proteins. Low-fat proteins include fish, chicken without skin, eggs, beans, and tofu. Avoid fatty meat, cured and processed meat, or chicken with skin. Avoid pre-made or processed food. Limit the amount of salt in your diet to less than 1,500 mg each day. Do not drink alcohol if: Your doctor tells you not to drink. You are pregnant, may be pregnant, or are planning to become pregnant. If you drink alcohol: Limit how much you have to: 0-1 drink a day for women. 0-2 drinks a day for men. Know how much alcohol is in your drink. In the [place] one drink equals one 12 oz bottle of beer (355 mL), one 5 oz glass of wine (148 mL), or one 1 oz glass of hard liquor (44 mL). Lifestyle  Work with your doctor to stay at a healthy weight or to lose weight. Ask your doctor what the best weight is for you. Get at least 30 minutes of exercise that causes your heart to beat faster (aerobic exercise) most days of the week. This may include walking, swimming, or biking. Get at least 30 minutes of exercise that strengthens your muscles (resistance exercise) at least 3 days a week. This may include lifting weights or doing Pilates. Do not smoke or use any products that contain nicotine or tobacco. If you need help quitting, ask your doctor. Check your blood pressure at home as told by your doctor. Keep all follow-up visits. Medicines Take over-the-counter and prescription medicines only as told by your doctor. Follow directions carefully. Do not skip doses of blood pressure medicine. The medicine does not work as well if you skip doses. Skipping doses also puts you at risk for problems. Ask your doctor about side effects or reactions to medicines that you should watch for. Contact a doctor if: You think you are having a reaction to the medicine you are taking. You have headaches that keep coming back. You feel dizzy. You  have swelling in your ankles. You have trouble with your vision. Get  help right away if: You get a very bad headache. You start to feel mixed up (confused). You feel weak or numb. You feel faint. You have very bad pain in your: Chest. Belly (abdomen). You vomit more than once. You have trouble breathing. These symptoms may be an emergency. Get help right away. Call 911. Do not wait to see if the symptoms will go away. Do not drive yourself to the hospital. Summary Hypertension is another name for high blood pressure. High blood pressure forces your heart to work harder to pump blood. For most people, a normal blood pressure is less than 120/80. Making healthy choices can help lower blood pressure. If your blood pressure does not get lower with healthy choices, you may need to take medicine. This information is not intended to replace advice given to you by your health care provider. Make sure you discuss any questions you have with your health care provider. Document Revised: 01/26/2021 Document Reviewed: 01/26/2021 Elsevier Patient Education  2024 [employer].

## 2022-12-28 ENCOUNTER — Other Ambulatory Visit (INDEPENDENT_AMBULATORY_CARE_PROVIDER_SITE_OTHER): Payer: Self-pay | Admitting: Primary Care

## 2022-12-28 DIAGNOSIS — R944 Abnormal results of kidney function studies: Secondary | ICD-10-CM

## 2022-12-28 LAB — LIPID PANEL
Chol/HDL Ratio: 2.1 ratio (ref 0.0–5.0)
Cholesterol, Total: 133 mg/dL (ref 100–199)
HDL: 62 mg/dL (ref >39)
LDL Chol Calc (NIH): 49 mg/dL (ref 0–99)
Triglycerides: 128 mg/dL (ref 0–149)
VLDL Cholesterol Cal: 22 mg/dL (ref 5–40)

## 2022-12-28 LAB — CBC WITH DIFFERENTIAL/PLATELET
Basophils Absolute: 0 10*3/uL (ref 0.0–0.2)
Basos: 1 %
EOS (ABSOLUTE): 0.2 10*3/uL (ref 0.0–0.4)
Eos: 3 %
Hematocrit: 37.2 % — ABNORMAL LOW (ref 37.5–51.0)
Hemoglobin: 12.3 g/dL — ABNORMAL LOW (ref 13.0–17.7)
Immature Grans (Abs): 0 10*3/uL (ref 0.0–0.1)
Immature Granulocytes: 0 %
Lymphocytes Absolute: 1.9 10*3/uL (ref 0.7–3.1)
Lymphs: 28 %
MCH: 30.4 pg (ref 26.6–33.0)
MCHC: 33.1 g/dL (ref 31.5–35.7)
MCV: 92 fL (ref 79–97)
Monocytes Absolute: 0.9 10*3/uL (ref 0.1–0.9)
Monocytes: 14 %
Neutrophils Absolute: 3.6 10*3/uL (ref 1.4–7.0)
Neutrophils: 54 %
Platelets: 215 10*3/uL (ref 150–450)
RBC: 4.05 x10E6/uL — ABNORMAL LOW (ref 4.14–5.80)
RDW: 14.1 % (ref 11.6–15.4)
WBC: 6.7 10*3/uL (ref 3.4–10.8)

## 2022-12-28 LAB — CMP14+EGFR
ALT: 20 IU/L (ref 0–44)
AST: 20 IU/L (ref 0–40)
Albumin: 4.2 g/dL (ref 3.9–4.9)
Alkaline Phosphatase: 129 IU/L — ABNORMAL HIGH (ref 44–121)
BUN/Creatinine Ratio: 13 (ref 10–24)
BUN: 29 mg/dL — ABNORMAL HIGH (ref 8–27)
Bilirubin Total: 0.6 mg/dL (ref 0.0–1.2)
CO2: 24 mmol/L (ref 20–29)
Calcium: 9.4 mg/dL (ref 8.6–10.2)
Chloride: 102 mmol/L (ref 96–106)
Creatinine, Ser: 2.23 mg/dL — ABNORMAL HIGH (ref 0.76–1.27)
Globulin, Total: 3.5 g/dL (ref 1.5–4.5)
Glucose: 136 mg/dL — ABNORMAL HIGH (ref 70–99)
Potassium: 4.9 mmol/L (ref 3.5–5.2)
Sodium: 138 mmol/L (ref 134–144)
Total Protein: 7.7 g/dL (ref 6.0–8.5)
eGFR: 33 mL/min/{1.73_m2} — ABNORMAL LOW (ref >59)

## 2023-01-17 ENCOUNTER — Ambulatory Visit (INDEPENDENT_AMBULATORY_CARE_PROVIDER_SITE_OTHER): Payer: BC Managed Care – PPO | Admitting: Primary Care

## 2023-01-17 VITALS — BP 158/90

## 2023-01-17 DIAGNOSIS — I10 Essential (primary) hypertension: Secondary | ICD-10-CM

## 2023-01-17 DIAGNOSIS — Z760 Encounter for issue of repeat prescription: Secondary | ICD-10-CM | POA: Diagnosis not present

## 2023-01-17 DIAGNOSIS — Z0130 Encounter for examination of blood pressure without abnormal findings: Secondary | ICD-10-CM

## 2023-01-17 MED ORDER — LOSARTAN POTASSIUM 50 MG PO TABS
50.0000 mg | ORAL_TABLET | Freq: Every day | ORAL | 1 refills | Status: DC
Start: 2023-01-17 — End: 2023-11-18

## 2023-01-17 NOTE — Progress Notes (Signed)
[facility]   Tony Dean is a 62 y.o. male presents for hypertension evaluation, Denies shortness of breath, headaches, chest pain or lower extremity edema, sudden onset, vision changes, unilateral weakness, dizziness, paresthesias   Patient reports adherence with medications.  Dietary habits include: low sodium diet no process food ( just explain NO CAN FOODS) Exercise habits include:walking  Family / Social history: unknown    Past Medical History:  Diagnosis Date   Abnormal electrocardiogram (ECG) (EKG)    Dehydration    Dizziness    DM (diabetes mellitus) (HCC)    ED (erectile dysfunction)    Gout    Hypertension    Lymphangitis of groin    Scrotal pain    Sprain of left wrist    Urinary retention    Past Surgical History:  Procedure Laterality Date   APPENDECTOMY     INGUINAL HERNIA REPAIR  1990's   right   Allergies  Allergen Reactions   Lisinopril Cough   Current Outpatient Medications on File Prior to Visit  Medication Sig Dispense Refill   allopurinol (ZYLOPRIM) 100 MG tablet Take 1 tablet (100 mg total) by mouth daily. 90 tablet 1   amLODipine (NORVASC) 10 MG tablet TAKE 1 TABLET BY MOUTH DAILY 90 tablet 1   atorvastatin (LIPITOR) 10 MG tablet TAKE 1 TABLET BY MOUTH DAILY 90 tablet 1   carvedilol (COREG) 6.25 MG tablet Take 1 tablet (6.25 mg total) by mouth 2 (two) times daily with a meal. 180 tablet 1   oxyCODONE (OXY IR/ROXICODONE) 5 MG immediate release tablet Take 1 tablet (5 mg total) by mouth every 4 (four) hours as needed for moderate pain. 30 tablet 0   No current facility-administered medications on file prior to visit.   Social History   Socioeconomic History   Marital status: Legally Separated    Spouse name: Not on file   Number of children: Not on file   Years of education: Not on file   Highest education level: Not on file  Occupational History   Not on file  Tobacco Use   Smoking status: Never   Smokeless  tobacco: Never  Substance and Sexual Activity   Alcohol use: Yes    Alcohol/week: 22.0 - 24.0 standard drinks of alcohol    Types: 14 Cans of beer, 8 - 10 Shots of liquor per week    Comment: reports drinks daily   Drug use: No   Sexual activity: Not Currently  Other Topics Concern   Not on file  Social History Narrative   Not on file   Social Determinants of Health   Financial Resource Strain: Not on file  Food Insecurity: Not on file  Transportation Needs: Not on file  Physical Activity: Not on file  Stress: Not on file  Social Connections: Not on file  Intimate Partner Violence: Not on file   Family History  Problem Relation Age of Onset   Healthy Mother    Hypertension Father    Colon cancer Neg Hx      OBJECTIVE:  Vitals:   01/17/23 1041 01/17/23 1042  BP: (!) 147/97 (!) 158/90    Physical Exam Vitals reviewed.  Constitutional:      Appearance: Normal appearance.  HENT:     Nose: Nose normal.     Mouth/Throat:     Comments: Poor endention Cardiovascular:     Rate and Rhythm: Normal rate and regular rhythm.  Pulmonary:     Effort: Pulmonary effort is normal.  Breath sounds: Normal breath sounds.  Abdominal:     General: Bowel sounds are normal. There is distension.     Palpations: Abdomen is soft.  Musculoskeletal:        General: Normal range of motion.  Neurological:     Mental Status: He is alert and oriented to person, place, and time.  Psychiatric:        Mood and Affect: Mood normal.        Behavior: Behavior normal.    ROS Comprehensive ROS Pertinent positive and negative noted in HPI   Last 3 Office BP readings: BP Readings from Last 3 Encounters:  01/17/23 (!) 158/90  12/27/22 (!) 148/92  09/26/22 130/78    BMET    Component Value Date/Time   NA 138 12/27/2022 0938   K 4.9 12/27/2022 0938   CL 102 12/27/2022 0938   CO2 24 12/27/2022 0938   GLUCOSE 136 (H) 12/27/2022 0938   GLUCOSE 259 (H) 12/23/2021 0104   BUN 29 (H)  12/27/2022 0938   CREATININE 2.23 (H) 12/27/2022 0938   CALCIUM 9.4 12/27/2022 0938   GFRNONAA 36 (L) 12/23/2021 0104   GFRAA 48 (L) 05/12/2020 1534    Renal function: CrCl cannot be calculated (Patient's most recent lab result is older than the maximum 21 days allowed.).  Clinical ASCVD: Yes  The 10-year ASCVD risk score (Arnett DK, et al., 2019) is: 29.8%   Values used to calculate the score:     Age: 71 years     Sex: Male     Is Non-Hispanic African American: Yes     Diabetic: Yes     Tobacco smoker: No     Systolic Blood Pressure: 158 mmHg     Is BP treated: Yes     HDL Cholesterol: 62 mg/dL     Total Cholesterol: 133 mg/dL  ASCVD risk factors include- [place]   Meds ordered this encounter  Medications   losartan (COZAAR) 50 MG tablet    Sig: Take 1 tablet (50 mg total) by mouth daily.    Dispense:  90 tablet    Refill:  1    Order Specific Question:   Supervising Provider    Answer:   Quentin Angst [[id number]]  Tony Dean was seen today for blood pressure check.  Diagnoses and all orders for this visit:  BP check Increased losartan (COZAAR) 50 MG tablet; Take 1 tablet (50 mg total) by mouth daily.  Hypertension, unspecified type -Counseled on lifestyle modifications for blood pressure control including reduced dietary sodium, increased exercise, weight reduction and adequate sleep. Also, educated patient about the risk for cardiovascular events, stroke and heart attack. Also counseled patient about the importance of medication adherence. If you participate in smoking, it is important to stop using tobacco as this will increase the risks associated with uncontrolled blood pressure.   Goal BP: 140/90 For patients younger than 60: Goal BP < 130/80. For patients 60 and older: Goal BP < 140/90. For patients with diabetes: Goal BP < 130/80. Your most recent BP: 150/90  Minimize salt intake. Minimize alcohol intake    This note has been created with Engineer, agricultural. Any transcriptional errors are unintentional.   Grayce Sessions, NP 01/17/2023, 10:44 AM

## 2023-02-07 ENCOUNTER — Ambulatory Visit (INDEPENDENT_AMBULATORY_CARE_PROVIDER_SITE_OTHER): Payer: BC Managed Care – PPO

## 2023-02-07 VITALS — BP 126/88

## 2023-02-07 DIAGNOSIS — Z0130 Encounter for examination of blood pressure without abnormal findings: Secondary | ICD-10-CM

## 2023-02-07 NOTE — Progress Notes (Signed)
Blood Pressure Recheck Visit  Name: Tony Dean MRN: [id number] Date of Birth: [birth year]  [name] presents today for Blood Pressure recheck with clinical support staff.   BP Readings from Last 3 Encounters:  02/07/23 126/88  01/17/23 (!) 158/90  12/27/22 (!) 148/92    Current Outpatient Medications  Medication Sig Dispense Refill   allopurinol (ZYLOPRIM) 100 MG tablet Take 1 tablet (100 mg total) by mouth daily. 90 tablet 1   amLODipine (NORVASC) 10 MG tablet TAKE 1 TABLET BY MOUTH DAILY 90 tablet 1   atorvastatin (LIPITOR) 10 MG tablet TAKE 1 TABLET BY MOUTH DAILY 90 tablet 1   carvedilol (COREG) 6.25 MG tablet Take 1 tablet (6.25 mg total) by mouth 2 (two) times daily with a meal. 180 tablet 1   losartan (COZAAR) 50 MG tablet Take 1 tablet (50 mg total) by mouth daily. 90 tablet 1   oxyCODONE (OXY IR/ROXICODONE) 5 MG immediate release tablet Take 1 tablet (5 mg total) by mouth every 4 (four) hours as needed for moderate pain. 30 tablet 0   No current facility-administered medications for this visit.    Hypertensive Medication Review: Patient states that they are taking all their hypertensive medications as prescribed and their last dose of hypertensive medications was this morning at 8:03am  Documentation of any medication adherence discrepancies: none  Provider Recommendation:  Spoke to [place] and they stated: BP is definitely better than last time! Continue medication and watch salt intake and will see him on follow up visit    Patient has been given provider's recommendations and does not have any questions or concerns at this time. Patient will contact the office for any future questions or concerns.

## 2023-02-20 ENCOUNTER — Other Ambulatory Visit (INDEPENDENT_AMBULATORY_CARE_PROVIDER_SITE_OTHER): Payer: Self-pay | Admitting: Primary Care

## 2023-02-20 DIAGNOSIS — E119 Type 2 diabetes mellitus without complications: Secondary | ICD-10-CM

## 2023-02-21 NOTE — Telephone Encounter (Signed)
Requested Prescriptions  Refused Prescriptions Disp Refills   metFORMIN (GLUCOPHAGE-XR) 500 MG 24 hr tablet [Pharmacy Med Name: METFORMIN HCL ER 500 MG TABLET] 30 tablet 1    Sig: TAKE 1 TABLET BY MOUTH DAILY WITH BREAKFAST     Endocrinology:  Diabetes - Biguanides Failed - 02/20/2023  1:25 PM      Failed - Cr in normal range and within 360 days    Creatinine, Ser  Date Value Ref Range Status  12/27/2022 2.23 (H) 0.76 - 1.27 mg/dL Final         Failed - eGFR in normal range and within 360 days    GFR calc Af Amer  Date Value Ref Range Status  05/12/2020 48 (L) >59 mL/min/1.73 Final    Comment:    **In accordance with recommendations from the NKF-ASN Task force,**   [facility] is in the process of updating its eGFR calculation to the   2021 CKD-EPI creatinine equation that estimates kidney function   without a race variable.    GFR, Estimated  Date Value Ref Range Status  12/23/2021 36 (L) >60 mL/min Final    Comment:    (NOTE) Calculated using the CKD-EPI Creatinine Equation (2021)    eGFR  Date Value Ref Range Status  12/27/2022 33 (L) >59 mL/min/1.73 Final         Failed - B12 Level in normal range and within 720 days    No results found for: "VITAMINB12"       Passed - HBA1C is between 0 and 7.9 and within 180 days    HbA1c, POC (controlled diabetic range)  Date Value Ref Range Status  12/27/2022 6.1 0.0 - 7.0 % Final         Passed - Valid encounter within last 6 months    Recent Outpatient Visits           1 month ago Type 2 diabetes mellitus without complication, without long-term current use of insulin (HCC)   [place] [facility] Grayce Sessions, NP   4 months ago Type 2 diabetes mellitus without complication, without long-term current use of insulin (HCC)   [place] [facility] Grayce Sessions, NP   8 months ago Type 2 diabetes mellitus without complication, without long-term current use of insulin (HCC)   [facility] Grayce Sessions, NP   11 months ago Type 2 diabetes mellitus without complication, without long-term current use of insulin (HCC)   [place] [facility] Grayce Sessions, NP   1 year ago History of gout   [place] [facility] Grayce Sessions, NP       Future Appointments             In 1 month Edwards, Kinnie Scales, NP [place] [facility]            Passed - CBC within normal limits and completed in the last 12 months    WBC  Date Value Ref Range Status  12/27/2022 6.7 3.4 - 10.8 x10E3/uL Final  12/22/2021 11.0 (H) 4.0 - 10.5 K/uL Final   RBC  Date Value Ref Range Status  12/27/2022 4.05 (L) 4.14 - 5.80 x10E6/uL Final  12/22/2021 3.18 (L) 4.22 - 5.81 MIL/uL Final   Hemoglobin  Date Value Ref Range Status  12/27/2022 12.3 (L) 13.0 - 17.7 g/dL Final   Hematocrit  Date Value Ref Range Status  12/27/2022 37.2 (L) 37.5 -  51.0 % Final   MCHC  Date Value Ref Range Status  12/27/2022 33.1 31.5 - 35.7 g/dL Final  57/32/2025 42.7 30.0 - 36.0 g/dL Final   [facility]  Date Value Ref Range Status  12/27/2022 30.4 26.6 - 33.0 pg Final  12/22/2021 32.1 26.0 - 34.0 pg Final   MCV  Date Value Ref Range Status  12/27/2022 92 79 - 97 fL Final   No results found for: "PLTCOUNTKUC", "LABPLAT", "POCPLA" RDW  Date Value Ref Range Status  12/27/2022 14.1 11.6 - 15.4 % Final

## 2023-02-24 ENCOUNTER — Other Ambulatory Visit (INDEPENDENT_AMBULATORY_CARE_PROVIDER_SITE_OTHER): Payer: Self-pay | Admitting: Primary Care

## 2023-02-24 DIAGNOSIS — E119 Type 2 diabetes mellitus without complications: Secondary | ICD-10-CM

## 2023-03-18 IMAGING — CT CT RENAL STONE PROTOCOL
2 of 4 series · 16 of 46 positions shown, 18 images · non-contrast
Comparison: No priors.

CLINICAL DATA: 59-year-old male with history of flank pain and
stiffness in the left lower back. Difficulty urinating.

EXAM:
CT ABDOMEN AND PELVIS WITHOUT CONTRAST
TECHNIQUE: Multidetector CT imaging of the abdomen and pelvis was performed
following the standard protocol without IV contrast.

[Series 3: stone study 5.0 i30f 2 · axial · 0.77mm/px · z∈[-364,+72]mm · 13 of 95 slices shown, 15 images]
[im 4/95  soft-tissue]
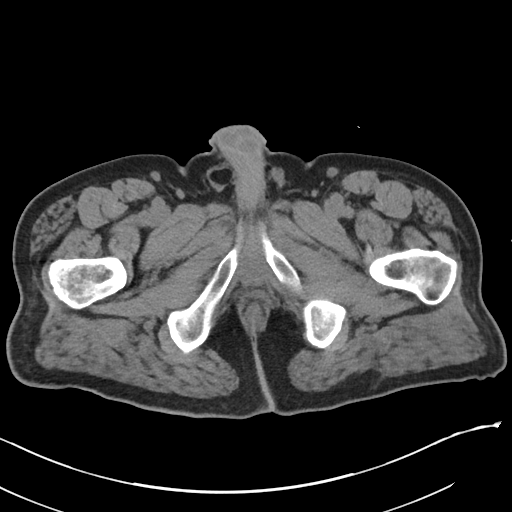
[im 4/95  bone]
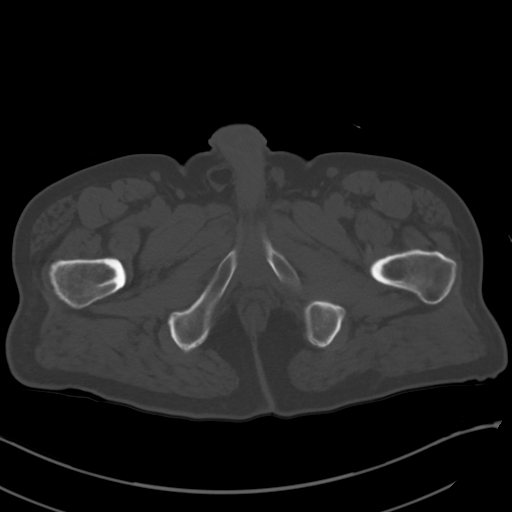
[im 12/95  soft-tissue]
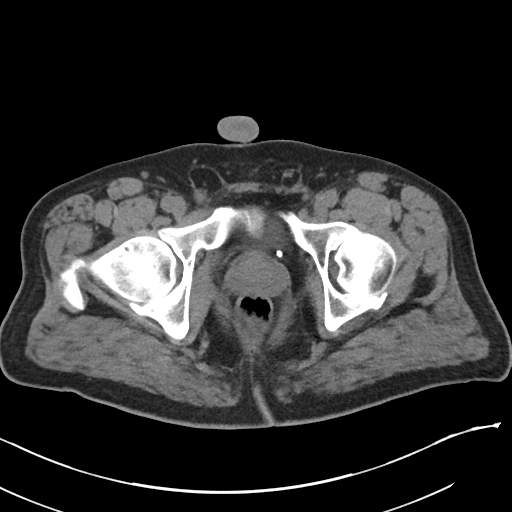
[im 20/95  soft-tissue]
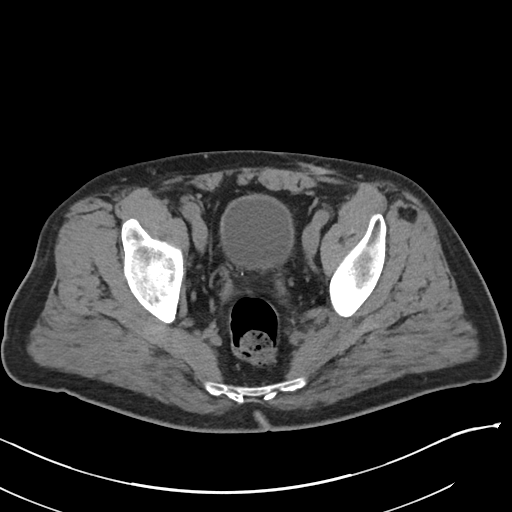
[im 28/95  soft-tissue]
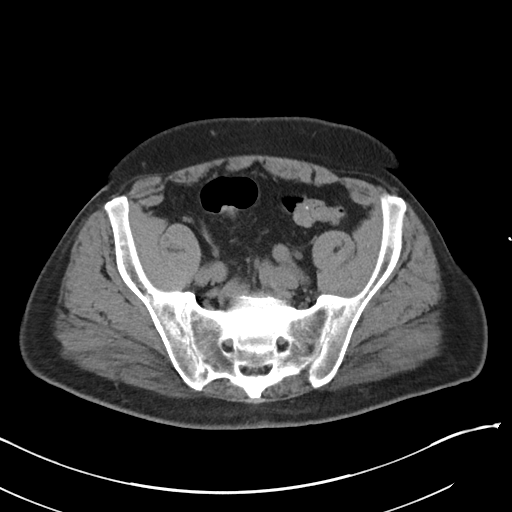
[im 32/95  soft-tissue]
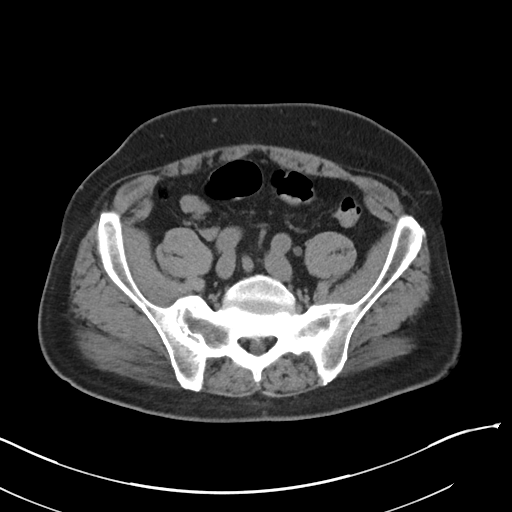
[im 40/95  soft-tissue]
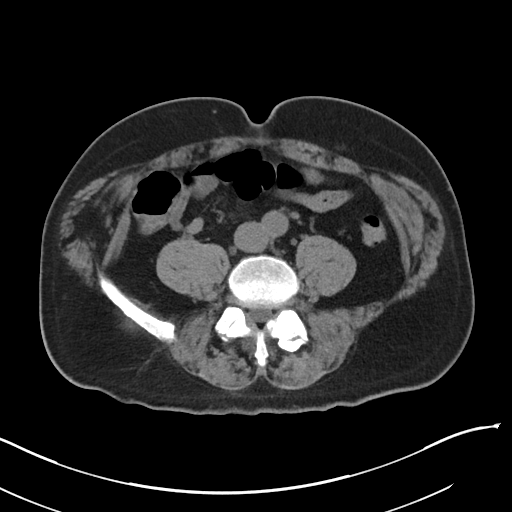
[im 48/95  soft-tissue]
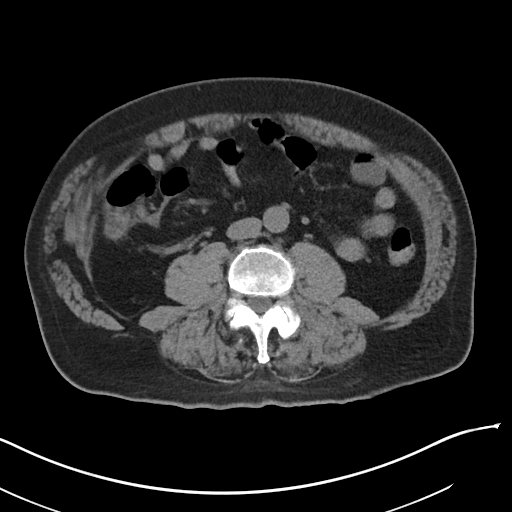
[im 55/95  soft-tissue]
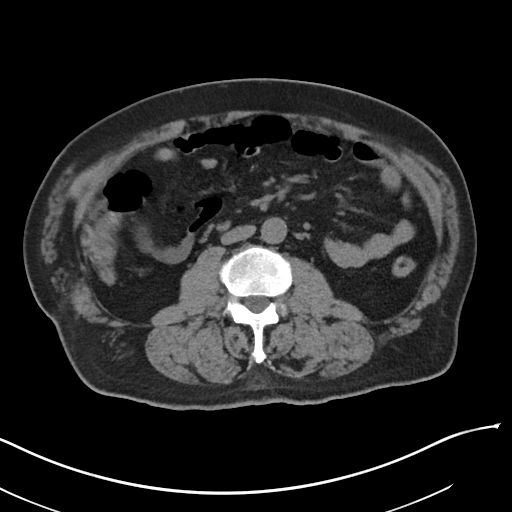
[im 63/95  soft-tissue]
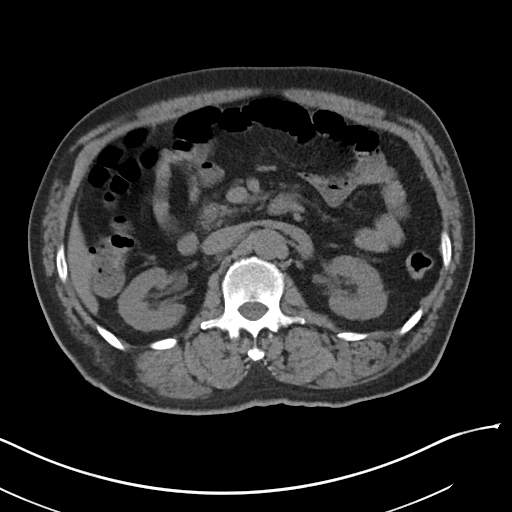
[im 63/95  bone]
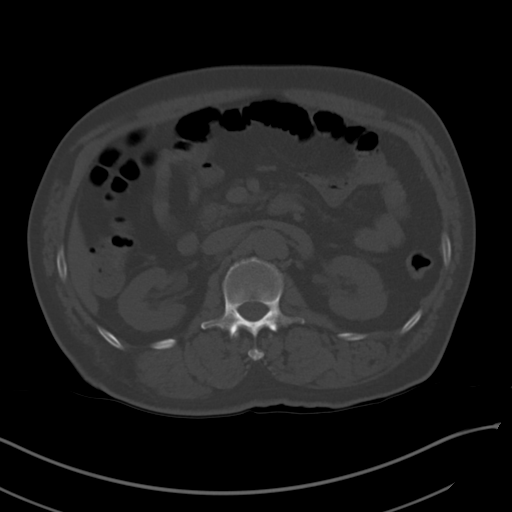
[im 67/95  soft-tissue]
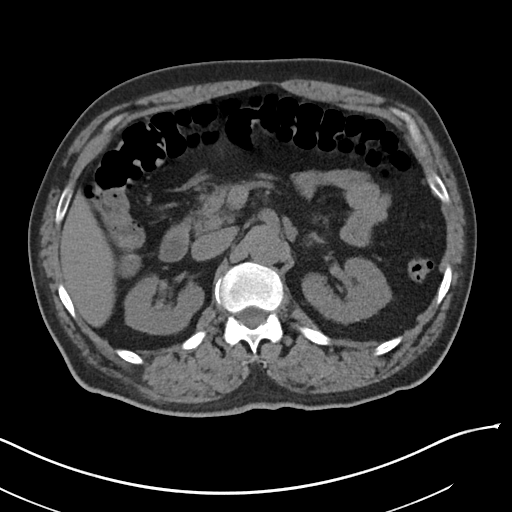
[im 75/95  soft-tissue]
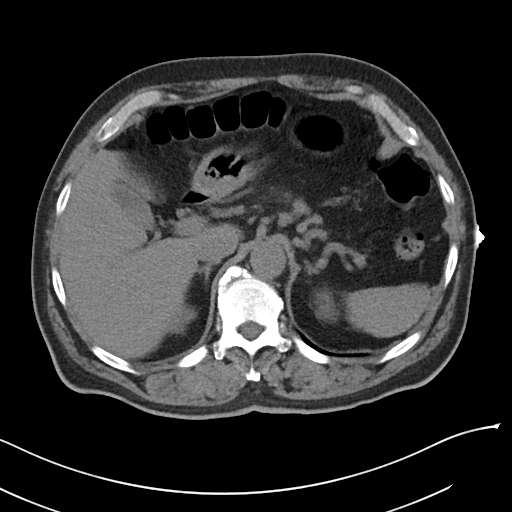
[im 83/95  soft-tissue]
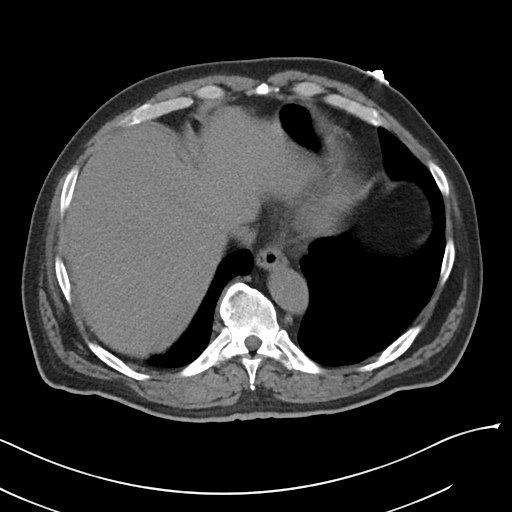
[im 91/95  soft-tissue]
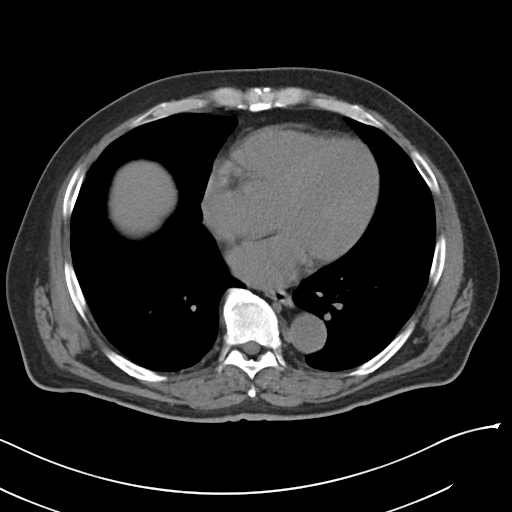

[Series 6: coronal soft tissue · coronal · 0.79mm/px · 3 of 101 slices shown]
[im 34/101  soft-tissue]
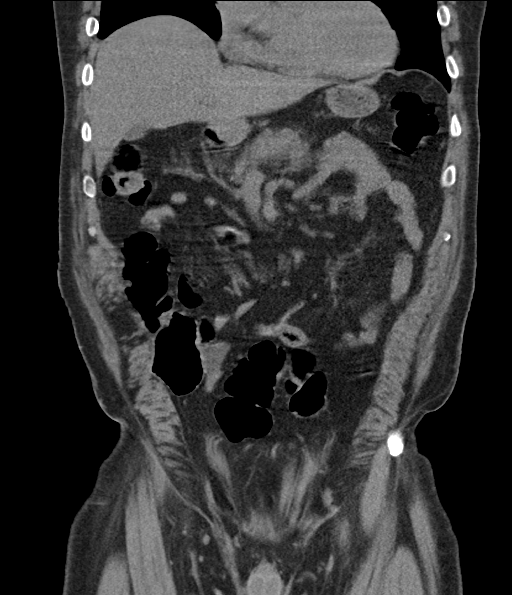
[im 45/101  soft-tissue]
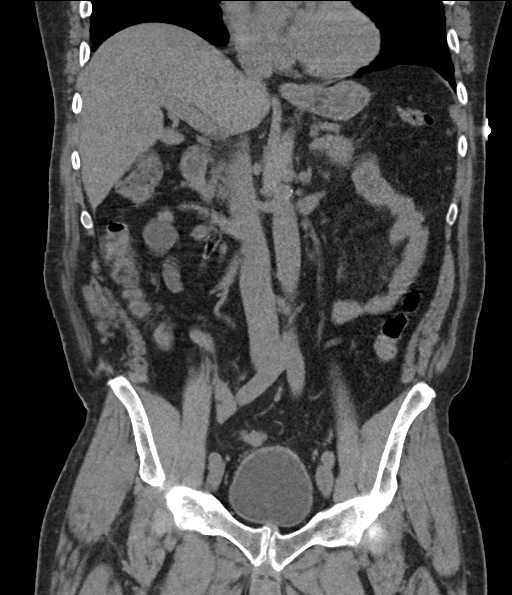
[im 56/101  soft-tissue]
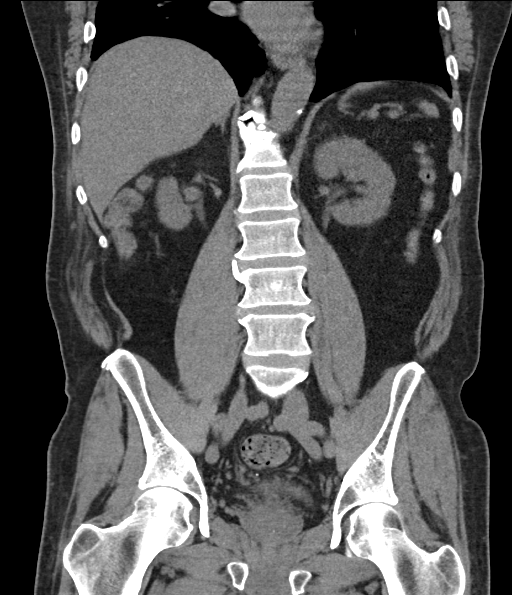

[16 of 46 positions shown; findings below may reference images not displayed]

FINDINGS: Lower chest: Aortic atherosclerosis.

Hepatobiliary: No definite suspicious cystic or solid hepatic
lesions are confidently identified on today's noncontrast CT
examination. Unenhanced appearance of the gallbladder is normal.

Pancreas: No definite pancreatic mass or peripancreatic fluid
collections or inflammatory changes are noted on today's noncontrast
CT examination.

Spleen: Unremarkable.

Adrenals/Urinary Tract: There are no abnormal calcifications within
the collecting system of either kidney, along the course of either
ureter, or within the lumen of the urinary bladder. No
hydroureteronephrosis or perinephric stranding to suggest urinary
tract obstruction at this time. The unenhanced appearance of the
kidneys is unremarkable bilaterally. Unenhanced appearance of the
kidneys, bilateral adrenal glands and urinary bladder is
unremarkable.

Stomach/Bowel: Unenhanced appearance of the stomach is normal. There
is no pathologic dilatation of small bowel or colon. The appendix is
not confidently identified and may be surgically absent. Regardless,
there are no inflammatory changes noted adjacent to the cecum to
suggest the presence of an acute appendicitis at this time.

Vascular/Lymphatic: Aortic atherosclerosis. No lymphadenopathy noted
in the abdomen or pelvis.

Reproductive: Prostate gland and seminal vesicles are unremarkable
in appearance.

Other: Small umbilical hernia containing some omental fat and a
short segment of mid small bowel. No significant volume of ascites.
No pneumoperitoneum.

Musculoskeletal: There are no aggressive appearing lytic or blastic
lesions noted in the visualized portions of the skeleton.
IMPRESSION: 1. No acute findings are noted in the abdomen or pelvis to account
for the patient's symptoms. Specifically, no urinary tract calculi
no findings of urinary tract obstruction.
2. Small umbilical hernia containing omental fat and a short segment
of mid small bowel, without evidence of bowel incarceration or
obstruction at this time.
3. Aortic atherosclerosis.

## 2023-03-27 ENCOUNTER — Telehealth (INDEPENDENT_AMBULATORY_CARE_PROVIDER_SITE_OTHER): Payer: Self-pay | Admitting: Primary Care

## 2023-03-27 LAB — LAB REPORT - SCANNED
Albumin, Urine POC: 3591.2
Calcium: 10.2
Creatinine, POC: 422.6 mg/dL
EGFR: 39
Microalb Creat Ratio: 850
PTH: 62

## 2023-03-27 NOTE — Telephone Encounter (Signed)
 Called pt to confirm apt. Pt will be present.

## 2023-03-28 ENCOUNTER — Ambulatory Visit (INDEPENDENT_AMBULATORY_CARE_PROVIDER_SITE_OTHER): Payer: BC Managed Care – PPO | Admitting: Primary Care

## 2023-03-28 ENCOUNTER — Encounter (INDEPENDENT_AMBULATORY_CARE_PROVIDER_SITE_OTHER): Payer: Self-pay | Admitting: Primary Care

## 2023-03-28 ENCOUNTER — Encounter: Payer: Self-pay | Admitting: Internal Medicine

## 2023-03-28 VITALS — BP 140/86 | HR 80 | Resp 16 | Wt 222.8 lb

## 2023-03-28 DIAGNOSIS — Z23 Encounter for immunization: Secondary | ICD-10-CM | POA: Diagnosis not present

## 2023-03-28 DIAGNOSIS — R351 Nocturia: Secondary | ICD-10-CM

## 2023-03-28 DIAGNOSIS — R7303 Prediabetes: Secondary | ICD-10-CM | POA: Diagnosis not present

## 2023-03-28 DIAGNOSIS — I10 Essential (primary) hypertension: Secondary | ICD-10-CM | POA: Diagnosis not present

## 2023-03-28 NOTE — Patient Instructions (Signed)

## 2023-03-28 NOTE — Progress Notes (Signed)
[facility]  Cap Wernicke, is a 62 y.o. male  Tony Dean  Tony Dean  DOB - 22-Apr-1961  Chief Complaint  Patient presents with   Hypertension    Took bp medication at 8am        Subjective:   Tony Dean is a 62 y.o. male here today for a follow up visit HTN.Patient has No headache, No chest pain, No abdominal pain - No Nausea, No new weakness tingling or numbness, No Cough - shortness of breath. Per patient seen nephrologist Tuesday referred for low GFR. Note not available.    No problems updated.  Comprehensive ROS Pertinent positive and negative noted in HPI   Allergies  Allergen Reactions   Lisinopril Cough    Past Medical History:  Diagnosis Date   Abnormal electrocardiogram (ECG) (EKG)    Dehydration    Dizziness    DM (diabetes mellitus) (HCC)    ED (erectile dysfunction)    Gout    Hypertension    Lymphangitis of groin    Scrotal pain    Sprain of left wrist    Urinary retention     Current Outpatient Medications on File Prior to Visit  Medication Sig Dispense Refill   allopurinol (ZYLOPRIM) 100 MG tablet Take 1 tablet (100 mg total) by mouth daily. 90 tablet 1   amLODipine (NORVASC) 10 MG tablet TAKE 1 TABLET BY MOUTH DAILY 90 tablet 1   atorvastatin (LIPITOR) 10 MG tablet TAKE 1 TABLET BY MOUTH DAILY 90 tablet 1   carvedilol (COREG) 6.25 MG tablet Take 1 tablet (6.25 mg total) by mouth 2 (two) times daily with a meal. 180 tablet 1   losartan (COZAAR) 50 MG tablet Take 1 tablet (50 mg total) by mouth daily. 90 tablet 1   oxyCODONE (OXY IR/ROXICODONE) 5 MG immediate release tablet Take 1 tablet (5 mg total) by mouth every 4 (four) hours as needed for moderate pain. 30 tablet 0   No current facility-administered medications on file prior to visit.   Health Maintenance  Topic Date Due   Eye exam for diabetics  Never done   Colon Cancer Screening  02/08/2018   COVID-19 Vaccine (4 - 2023-24 season) 12/23/2022   Yearly  kidney health urinalysis for diabetes  03/20/2023   Complete foot exam   06/26/2023   Hemoglobin A1C  06/26/2023   Yearly kidney function blood test for diabetes  12/27/2023   DTaP/Tdap/Td vaccine (2 - Td or Tdap) 07/02/2027   Flu Shot  Completed   Hepatitis C Screening  Completed   HIV Screening  Completed   Zoster (Shingles) Vaccine  Completed   HPV Vaccine  Aged Out    Objective:   Vitals:   03/28/23 0959 03/28/23 1010  BP: (!) 147/97 (!) 140/86  Pulse: 80   Resp: 16   SpO2: 98%   Weight: 222 lb 12.8 oz (101.1 kg)    BP Readings from Last 3 Encounters:  03/28/23 (!) 140/86  02/07/23 126/88  01/17/23 (!) 158/90      Physical Exam Vitals reviewed.  Constitutional:      Appearance: He is obese.  HENT:     Head: Normocephalic.     Right Ear: Tympanic membrane and external ear normal.     Left Ear: Tympanic membrane and external ear normal.     Nose: Nose normal.  Eyes:     Extraocular Movements: Extraocular movements intact.  Cardiovascular:     Rate and Rhythm: Normal rate and regular rhythm.  Pulmonary:     Effort: Pulmonary effort is normal.     Breath sounds: Normal breath sounds.  Abdominal:     General: Bowel sounds are normal.  Musculoskeletal:        General: Normal range of motion.     Cervical back: Normal range of motion and neck supple.  Skin:    General: Skin is warm and dry.  Neurological:     Mental Status: He is alert and oriented to person, place, and time.  Psychiatric:        Mood and Affect: Mood normal.        Behavior: Behavior normal.     Assessment & Plan   Ero was seen today for hypertension.  Diagnoses and all orders for this visit:  Encounter for immunization -     Flu vaccine trivalent PF, 6mos and older(Flulaval,Afluria,Fluarix,Fluzone)  Prediabetes -     Microalbumin / creatinine urine ratio  Essential hypertension BP goal - < `140/90  Explained that having normal blood pressure is the goal and medications are  helping to get to goal and maintain normal blood pressure. DIET: Limit salt intake, read nutrition labels to check salt content, limit fried and high fatty foods  Avoid using multisymptom OTC cold preparations that generally contain sudafed which can rise BP. Consult with pharmacist on best cold relief products to use for persons with HTN EXERCISE Discussed incorporating exercise such as walking - 30 minutes most days of the week and can do in 10 minute intervals     Nocturia  3-5 times urination at night -     PSA     Patient have been counseled extensively about nutrition and exercise. Other issues discussed during this visit include: low cholesterol diet, weight control and daily exercise, foot care, annual eye examinations at Ophthalmology, importance of adherence with medications and regular follow-up. We also discussed long term complications of uncontrolled diabetes and hypertension.   Return in about 3 months (around 06/26/2023) for fasting labs.  The patient was given clear instructions to go to ER or return to medical center if symptoms don't improve, worsen or new problems develop. The patient verbalized understanding. The patient was told to call to get lab results if they haven't heard anything in the next week.   This note has been created with Education officer, environmental. Any transcriptional errors are unintentional.   Grayce Sessions, NP 03/28/2023, 10:21 AM

## 2023-03-30 LAB — MICROALBUMIN / CREATININE URINE RATIO
Creatinine, Urine: 121.3 mg/dL
Microalb/Creat Ratio: 133 mg/g{creat} — ABNORMAL HIGH (ref 0–29)
Microalbumin, Urine: 161.6 ug/mL

## 2023-03-30 LAB — PSA: Prostate Specific Ag, Serum: 0.9 ng/mL (ref 0.0–4.0)

## 2023-04-11 ENCOUNTER — Other Ambulatory Visit (INDEPENDENT_AMBULATORY_CARE_PROVIDER_SITE_OTHER): Payer: BC Managed Care – PPO

## 2023-04-11 DIAGNOSIS — E119 Type 2 diabetes mellitus without complications: Secondary | ICD-10-CM

## 2023-04-11 DIAGNOSIS — I10 Essential (primary) hypertension: Secondary | ICD-10-CM | POA: Diagnosis not present

## 2023-04-11 DIAGNOSIS — E7841 Elevated Lipoprotein(a): Secondary | ICD-10-CM

## 2023-04-12 LAB — LIPID PANEL
Chol/HDL Ratio: 2.6 {ratio} (ref 0.0–5.0)
Cholesterol, Total: 149 mg/dL (ref 100–199)
HDL: 58 mg/dL (ref >39)
LDL Chol Calc (NIH): 69 mg/dL (ref 0–99)
Triglycerides: 127 mg/dL (ref 0–149)
VLDL Cholesterol Cal: 22 mg/dL (ref 5–40)

## 2023-04-12 LAB — CMP14+EGFR
ALT: 17 [IU]/L (ref 0–44)
AST: 16 [IU]/L (ref 0–40)
Albumin: 4.2 g/dL (ref 3.9–4.9)
Alkaline Phosphatase: 128 [IU]/L — ABNORMAL HIGH (ref 44–121)
BUN/Creatinine Ratio: 15 (ref 10–24)
BUN: 34 mg/dL — ABNORMAL HIGH (ref 8–27)
Bilirubin Total: 0.4 mg/dL (ref 0.0–1.2)
CO2: 21 mmol/L (ref 20–29)
Calcium: 9 mg/dL (ref 8.6–10.2)
Chloride: 100 mmol/L (ref 96–106)
Creatinine, Ser: 2.24 mg/dL — ABNORMAL HIGH (ref 0.76–1.27)
Globulin, Total: 3.8 g/dL (ref 1.5–4.5)
Glucose: 119 mg/dL — ABNORMAL HIGH (ref 70–99)
Potassium: 5.1 mmol/L (ref 3.5–5.2)
Sodium: 139 mmol/L (ref 134–144)
Total Protein: 8 g/dL (ref 6.0–8.5)
eGFR: 32 mL/min/{1.73_m2} — ABNORMAL LOW (ref >59)

## 2023-04-12 LAB — CBC WITH DIFFERENTIAL/PLATELET
Basophils Absolute: 0 10*3/uL (ref 0.0–0.2)
Basos: 1 %
EOS (ABSOLUTE): 0.2 10*3/uL (ref 0.0–0.4)
Eos: 3 %
Hematocrit: 39.1 % (ref 37.5–51.0)
Hemoglobin: 12.5 g/dL — ABNORMAL LOW (ref 13.0–17.7)
Immature Grans (Abs): 0 10*3/uL (ref 0.0–0.1)
Immature Granulocytes: 0 %
Lymphocytes Absolute: 1.7 10*3/uL (ref 0.7–3.1)
Lymphs: 26 %
MCH: 29.9 pg (ref 26.6–33.0)
MCHC: 32 g/dL (ref 31.5–35.7)
MCV: 94 fL (ref 79–97)
Monocytes Absolute: 0.9 10*3/uL (ref 0.1–0.9)
Monocytes: 14 %
Neutrophils Absolute: 3.7 10*3/uL (ref 1.4–7.0)
Neutrophils: 56 %
Platelets: 266 10*3/uL (ref 150–450)
RBC: 4.18 x10E6/uL (ref 4.14–5.80)
RDW: 13.6 % (ref 11.6–15.4)
WBC: 6.5 10*3/uL (ref 3.4–10.8)

## 2023-04-17 ENCOUNTER — Other Ambulatory Visit (INDEPENDENT_AMBULATORY_CARE_PROVIDER_SITE_OTHER): Payer: Self-pay | Admitting: Primary Care

## 2023-04-17 DIAGNOSIS — E7841 Elevated Lipoprotein(a): Secondary | ICD-10-CM

## 2023-05-29 ENCOUNTER — Ambulatory Visit (AMBULATORY_SURGERY_CENTER): Payer: 59

## 2023-05-29 VITALS — Ht 70.0 in | Wt 215.0 lb

## 2023-05-29 DIAGNOSIS — Z1211 Encounter for screening for malignant neoplasm of colon: Secondary | ICD-10-CM

## 2023-05-29 MED ORDER — SUFLAVE 178.7 G PO SOLR: 1.0000 | Freq: Once | ORAL | 0 refills | Status: DC

## 2023-05-29 MED ORDER — SUFLAVE 178.7 G PO SOLR: 1.0000 | Freq: Once | ORAL | 0 refills | Status: AC

## 2023-05-29 NOTE — Addendum Note (Signed)
 Addended by: Maryanne Smiles on: 05/29/2023 11:31 AM   Modules accepted: Orders

## 2023-05-29 NOTE — Progress Notes (Signed)
 No egg or soy allergy known to patient  No issues known to pt with past sedation with any surgeries or procedures Patient denies ever being told they had issues or difficulty with intubation  No FH of Malignant Hyperthermia Pt is not on diet pills Pt is not on home 02  Pt is not on blood thinners  Pt denies issues with chronic constipation  No A fib or A flutter Have any cardiac testing pending--no Patient's chart reviewed by Norleen Schillings CNRA prior to previsit and patient appropriate for the [facility].  Previsit completed and red dot placed by patient's name on their procedure day (on provider's schedule).   Ambulates independently

## 2023-06-10 ENCOUNTER — Telehealth: Payer: Self-pay | Admitting: Internal Medicine

## 2023-06-10 ENCOUNTER — Other Ambulatory Visit: Payer: Self-pay

## 2023-06-10 DIAGNOSIS — Z1211 Encounter for screening for malignant neoplasm of colon: Secondary | ICD-10-CM

## 2023-06-10 NOTE — Telephone Encounter (Signed)
 Patient sister called and stated that her brother is having a procedure done Wednesday and requesting a call back on weather or not her brother needs to take his prep medication starting at 6 am tomorrow. Please advise.

## 2023-06-10 NOTE — Progress Notes (Signed)
 Patient sister, Aram Beecham, called, concerned about her brother's procedure being on Wednesday, 06/12/23 with bad weather predicted. Sister desired for appointment to be changed. RN changed appointment date to 07/18/23 at 3:00. Updated instructions were completed and sent to patient My Chart, as requested by sister. 06/12/23 appointment was cancelled.

## 2023-06-12 ENCOUNTER — Encounter: Payer: 59 | Admitting: Internal Medicine

## 2023-06-20 ENCOUNTER — Telehealth (INDEPENDENT_AMBULATORY_CARE_PROVIDER_SITE_OTHER): Payer: Self-pay | Admitting: Primary Care

## 2023-06-20 NOTE — Telephone Encounter (Signed)
 Spoke to pt about atp.. Will be present

## 2023-06-24 ENCOUNTER — Other Ambulatory Visit (INDEPENDENT_AMBULATORY_CARE_PROVIDER_SITE_OTHER): Payer: Self-pay | Admitting: Primary Care

## 2023-06-24 DIAGNOSIS — I10 Essential (primary) hypertension: Secondary | ICD-10-CM

## 2023-06-24 DIAGNOSIS — Z760 Encounter for issue of repeat prescription: Secondary | ICD-10-CM

## 2023-06-26 ENCOUNTER — Telehealth (INDEPENDENT_AMBULATORY_CARE_PROVIDER_SITE_OTHER): Payer: Self-pay | Admitting: Primary Care

## 2023-06-26 NOTE — Telephone Encounter (Signed)
 Spoke to pt about atp.. Will be present

## 2023-06-27 ENCOUNTER — Ambulatory Visit (INDEPENDENT_AMBULATORY_CARE_PROVIDER_SITE_OTHER): Payer: Self-pay | Admitting: Primary Care

## 2023-06-27 ENCOUNTER — Other Ambulatory Visit (HOSPITAL_COMMUNITY)
Admission: RE | Admit: 2023-06-27 | Discharge: 2023-06-27 | Disposition: A | Discharge status: Home / Self Care | Source: Ambulatory Visit | Attending: Primary Care | Admitting: Primary Care

## 2023-06-27 ENCOUNTER — Encounter (INDEPENDENT_AMBULATORY_CARE_PROVIDER_SITE_OTHER): Payer: Self-pay | Admitting: Primary Care

## 2023-06-27 VITALS — BP 130/87 | HR 86 | Temp 98.1°F | Ht 70.0 in | Wt 220.2 lb

## 2023-06-27 DIAGNOSIS — N451 Epididymitis: Secondary | ICD-10-CM | POA: Insufficient documentation

## 2023-06-27 LAB — POCT URINALYSIS DIP (CLINITEK)
Bilirubin, UA: NEGATIVE
Glucose, UA: NEGATIVE mg/dL
Ketones, POC UA: NEGATIVE mg/dL
Leukocytes, UA: NEGATIVE
Nitrite, UA: NEGATIVE
POC PROTEIN,UA: 100 — AB
Spec Grav, UA: 1.02 (ref 1.010–1.025)
Urobilinogen, UA: 0.2 U/dL
pH, UA: 5.5 (ref 5.0–8.0)

## 2023-06-27 NOTE — Progress Notes (Signed)
 [facility]  Tony Dean, is a 63 y.o. male  ZOX:[id number]  WJX:[id number]  DOB - January 16, 1961  Chief Complaint  Patient presents with   Medical Management of Chronic Issues       Subjective:   Tony Dean is a 63 y.o. male here today for an acute visit.  He is concerned with when he is lifting he has a pain that comes from his groin specifically his scrotum.  He denies pain with urination, however he does have urinary hesitancy, incomplete emptying of the bladder and erectile dysfunction.  Blood pressure is controlled- Denies shortness of breath, headaches, chest pain or lower extremity edema, sudden onset, vision changes, unilateral weakness, dizziness, paresthesias   No problems updated.  Comprehensive ROS Pertinent positive and negative noted in HPI   Allergies  Allergen Reactions   Lisinopril Cough    Past Medical History:  Diagnosis Date   Abnormal electrocardiogram (ECG) (EKG)    Dehydration    Dizziness    DM (diabetes mellitus) (HCC)    ED (erectile dysfunction)    Gout    Hyperlipidemia    Hypertension    Lymphangitis of groin    Scrotal pain    Sprain of left wrist    Urinary retention     Current Outpatient Medications on File Prior to Visit  Medication Sig Dispense Refill   allopurinol (ZYLOPRIM) 100 MG tablet Take 1 tablet (100 mg total) by mouth daily. 90 tablet 1   amLODipine (NORVASC) 10 MG tablet TAKE 1 TABLET BY MOUTH DAILY 90 tablet 1   atorvastatin (LIPITOR) 10 MG tablet TAKE 1 TABLET BY MOUTH DAILY 90 tablet 1   carvedilol (COREG) 6.25 MG tablet Take 1 tablet (6.25 mg total) by mouth 2 (two) times daily with a meal. 180 tablet 1   losartan (COZAAR) 50 MG tablet Take 1 tablet (50 mg total) by mouth daily. 90 tablet 1   oxyCODONE (OXY IR/ROXICODONE) 5 MG immediate release tablet Take 1 tablet (5 mg total) by mouth every 4 (four) hours as needed for moderate pain. 30 tablet 0   No current facility-administered medications  on file prior to visit.   Health Maintenance  Topic Date Due   Pneumococcal Vaccination (1 of 2 - PCV) Never done   Eye exam for diabetics  Never done   Colon Cancer Screening  02/08/2018   COVID-19 Vaccine (4 - 2024-25 season) 12/23/2022   Complete foot exam   06/26/2023   Hemoglobin A1C  06/26/2023   Yearly kidney health urinalysis for diabetes  03/27/2024   Yearly kidney function blood test for diabetes  04/10/2024   DTaP/Tdap/Td vaccine (2 - Td or Tdap) 07/02/2027   Flu Shot  Completed   Hepatitis C Screening  Completed   HIV Screening  Completed   Zoster (Shingles) Vaccine  Completed   HPV Vaccine  Aged Out    Objective:   Vitals:   06/27/23 0936  BP: 130/87  Pulse: 86  Temp: 98.1 F (36.7 C)  TempSrc: Oral  SpO2: 99%  Weight: 220 lb 3.2 oz (99.9 kg)  Height: 5\' 10"  (1.778 m)   Physical Exam Vitals reviewed.  Constitutional:      Appearance: He is obese.  HENT:     Head: Normocephalic.     Right Ear: Tympanic membrane and external ear normal.     Left Ear: Tympanic membrane and external ear normal.     Nose: Nose normal.  Eyes:     Extraocular Movements: Extraocular  movements intact.     Pupils: Pupils are equal, round, and reactive to light.  Cardiovascular:     Rate and Rhythm: Normal rate and regular rhythm.  Pulmonary:     Effort: Pulmonary effort is normal.     Breath sounds: Normal breath sounds.  Abdominal:     General: Bowel sounds are normal. There is distension.     Palpations: Abdomen is soft.  Musculoskeletal:        General: Normal range of motion.  Skin:    General: Skin is warm and dry.  Neurological:     Mental Status: He is oriented to person, place, and time.  Psychiatric:        Mood and Affect: Mood normal.        Behavior: Behavior normal.        Thought Content: Thought content normal.        Judgment: Judgment normal.     Assessment & Plan  [place] was seen today for medical management of chronic issues.  Diagnoses and  all orders for this visit:  Bilateral epididymitis -     POCT URINALYSIS DIP (CLINITEK) -     Cytology (oral, anal, urethral) ancillary only   Patient have been counseled extensively about nutrition and exercise. Other issues discussed during this visit include: low cholesterol diet, weight control and daily exercise, foot care, annual eye examinations at Ophthalmology, importance of adherence with medications and regular follow-up. We also discussed long term complications of uncontrolled diabetes and hypertension.   Return in about 6 months (around 12/28/2023) for Prediabetes and HTN , fasting labs.  The patient was given clear instructions to go to ER or return to medical center if symptoms don't improve, worsen or new problems develop. The patient verbalized understanding. The patient was told to call to get lab results if they haven't heard anything in the next week.   This note has been created with Education officer, environmental. Any transcriptional errors are unintentional.   Tony Sessions, NP 07/05/2023, 12:27 PM

## 2023-06-27 NOTE — Progress Notes (Signed)
 ASPatient is asking for his privates to be checked.

## 2023-07-01 ENCOUNTER — Encounter (INDEPENDENT_AMBULATORY_CARE_PROVIDER_SITE_OTHER): Payer: Self-pay

## 2023-07-01 LAB — CYTOLOGY, (ORAL, ANAL, URETHRAL) ANCILLARY ONLY
Chlamydia: NEGATIVE
Comment: NEGATIVE
Comment: NEGATIVE
Comment: NORMAL
Neisseria Gonorrhea: NEGATIVE
Trichomonas: NEGATIVE

## 2023-07-18 ENCOUNTER — Encounter: Payer: Self-pay | Admitting: Internal Medicine

## 2023-07-18 ENCOUNTER — Ambulatory Visit: Payer: 59 | Admitting: Internal Medicine

## 2023-07-18 VITALS — BP 148/99 | HR 98 | Temp 98.0°F | Resp 21 | Ht 70.0 in | Wt 215.0 lb

## 2023-07-18 DIAGNOSIS — Z1211 Encounter for screening for malignant neoplasm of colon: Secondary | ICD-10-CM

## 2023-07-18 DIAGNOSIS — K648 Other hemorrhoids: Secondary | ICD-10-CM | POA: Diagnosis not present

## 2023-07-18 MED ORDER — SODIUM CHLORIDE 0.9 % IV SOLN
500.0000 mL | Freq: Once | INTRAVENOUS | Status: DC
Start: 2023-07-18 — End: 2023-07-18

## 2023-07-18 NOTE — Patient Instructions (Signed)
-  Handout on hemorrhoids provided -repeat colonoscopy for surveillance in 10 years recommended -Continue present medications    YOU HAD AN ENDOSCOPIC PROCEDURE TODAY AT THE [facility]:   Refer to the procedure report that was given to you for any specific questions about what was found during the examination.  If the procedure report does not answer your questions, please call your gastroenterologist to clarify.  If you requested that your care partner not be given the details of your procedure findings, then the procedure report has been included in a sealed envelope for you to review at your convenience later.  YOU SHOULD EXPECT: Some feelings of bloating in the abdomen. Passage of more gas than usual.  Walking can help get rid of the air that was put into your GI tract during the procedure and reduce the bloating. If you had a lower endoscopy (such as a colonoscopy or flexible sigmoidoscopy) you may notice spotting of blood in your stool or on the toilet paper. If you underwent a bowel prep for your procedure, you may not have a normal bowel movement for a few days.  Please Note:  You might notice some irritation and congestion in your nose or some drainage.  This is from the oxygen used during your procedure.  There is no need for concern and it should clear up in a day or so.  SYMPTOMS TO REPORT IMMEDIATELY:  Following lower endoscopy (colonoscopy or flexible sigmoidoscopy):  Excessive amounts of blood in the stool  Significant tenderness or worsening of abdominal pains  Swelling of the abdomen that is new, acute  Fever of 100F or higher  For urgent or emergent issues, a gastroenterologist can be reached at any hour by calling [phone]. Do not use MyChart messaging for urgent concerns.    DIET:  We do recommend a small meal at first, but then you may proceed to your regular diet.  Drink plenty of fluids but you should avoid alcoholic beverages for 24 hours.  ACTIVITY:   You should plan to take it easy for the rest of today and you should NOT DRIVE or use heavy machinery until tomorrow (because of the sedation medicines used during the test).    FOLLOW UP: Our staff will call the number listed on your records the next business day following your procedure.  We will call around 7:15- 8:00 am to check on you and address any questions or concerns that you may have regarding the information given to you following your procedure. If we do not reach you, we will leave a message.     If any biopsies were taken you will be contacted by phone or by letter within the next 1-3 weeks.  Please call us at [phone] if you have not heard about the biopsies in 3 weeks.    SIGNATURES/CONFIDENTIALITY: You and/or your care partner have signed paperwork which will be entered into your electronic medical record.  These signatures attest to the fact that that the information above on your After Visit Summary has been reviewed and is understood.  Full responsibility of the confidentiality of this discharge information lies with you and/or your care-partner.

## 2023-07-18 NOTE — Op Note (Signed)
 [facility] Patient Name: Tony Dean Procedure Date: 07/18/2023 4:15 PM MRN: [id number] Endoscopist: Beverley Fiedler , MD, [id number] Age: 63 Referring MD:  Date of Birth: 1960/06/03 Gender: Male Account #: [id number] Procedure:                Colonoscopy Indications:              Screening for colorectal malignant neoplasm, Last                            colonoscopy: October 2016 (fair prep) Medicines:                Monitored Anesthesia Care Procedure:                Pre-Anesthesia Assessment:                           - Prior to the procedure, a History and Physical                            was performed, and patient medications and                            allergies were reviewed. The patient's tolerance of                            previous anesthesia was also reviewed. The risks                            and benefits of the procedure and the sedation                            options and risks were discussed with the patient.                            All questions were answered, and informed consent                            was obtained. Prior Anticoagulants: The patient has                            taken no anticoagulant or antiplatelet agents. ASA                            Grade Assessment: II - A patient with mild systemic                            disease. After reviewing the risks and benefits,                            the patient was deemed in satisfactory condition to                            undergo the procedure.  After obtaining informed consent, the colonoscope                            was passed under direct vision. Throughout the                            procedure, the patient's blood pressure, pulse, and                            oxygen saturations were monitored continuously. The                            Olympus Scope SN: [id number] was introduced through                            the anus and advanced  to the cecum, identified by                            appendiceal orifice and ileocecal valve. The                            colonoscopy was performed without difficulty. The                            patient tolerated the procedure well. The quality                            of the bowel preparation was good. The ileocecal                            valve, appendiceal orifice, and rectum were                            photographed. Scope In: 4:33:34 PM Scope Out: 4:42:56 PM Scope Withdrawal Time: 0 hours 8 minutes 10 seconds  Total Procedure Duration: 0 hours 9 minutes 22 seconds  Findings:                 The digital rectal exam was normal.                           The colon (entire examined portion) appeared normal.                           Internal hemorrhoids were found during                            retroflexion. The hemorrhoids were small. Complications:            No immediate complications. Estimated Blood Loss:     Estimated blood loss: none. Impression:               - The entire examined colon is normal.                           - Small internal hemorrhoids.                           -  No specimens collected. Recommendation:           - Patient has a contact number available for                            emergencies. The signs and symptoms of potential                            delayed complications were discussed with the                            patient. Return to normal activities tomorrow.                            Written discharge instructions were provided to the                            patient.                           - Resume previous diet.                           - Continue present medications.                           - Repeat colonoscopy in 10 years for screening                            purposes. Beverley Fiedler, MD 07/18/2023 4:44:51 PM This report has been signed electronically.

## 2023-07-18 NOTE — Progress Notes (Signed)
 GASTROENTEROLOGY PROCEDURE H&P NOTE   Primary Care Physician: Grayce Sessions, NP    Reason for Procedure:  Colon cancer screening  Plan:    Colonoscopy  Patient is appropriate for endoscopic procedure(s) in the ambulatory ([facility]) setting.  The nature of the procedure, as well as the risks, benefits, and alternatives were carefully and thoroughly reviewed with the patient. Ample time for discussion and questions allowed. The patient understood, was satisfied, and agreed to proceed.     HPI: Tony Dean is a 63 y.o. male who presents for colonoscopy.  Medical history as below.  Tolerated the prep.  No recent chest pain or shortness of breath.  No abdominal pain today.  Past Medical History:  Diagnosis Date   Abnormal electrocardiogram (ECG) (EKG)    Dehydration    Dizziness    DM (diabetes mellitus) (HCC)    ED (erectile dysfunction)    Gout    Hyperlipidemia    Hypertension    Lymphangitis of groin    Scrotal pain    Sprain of left wrist    Urinary retention     Past Surgical History:  Procedure Laterality Date   APPENDECTOMY     COLONOSCOPY     INGUINAL HERNIA REPAIR  12/22/1988   right    Prior to Admission medications   Medication Sig Start Date End Date Taking? Authorizing Provider  allopurinol (ZYLOPRIM) 100 MG tablet Take 1 tablet (100 mg total) by mouth daily. 05/23/22  Yes Grayce Sessions, NP  amLODipine (NORVASC) 10 MG tablet TAKE 1 TABLET BY MOUTH DAILY 06/24/23  Yes Grayce Sessions, NP  atorvastatin (LIPITOR) 10 MG tablet TAKE 1 TABLET BY MOUTH DAILY 04/18/23  Yes Grayce Sessions, NP  carvedilol (COREG) 6.25 MG tablet Take 1 tablet (6.25 mg total) by mouth 2 (two) times daily with a meal. 06/26/22  Yes Grayce Sessions, NP  losartan (COZAAR) 50 MG tablet Take 1 tablet (50 mg total) by mouth daily. 01/17/23  Yes Grayce Sessions, NP  oxyCODONE (OXY IR/ROXICODONE) 5 MG immediate release tablet Take 1 tablet (5 mg total) by mouth every  4 (four) hours as needed for moderate pain. Patient not taking: Reported on 07/18/2023 12/23/21   Barnetta Chapel, MD    Current Outpatient Medications  Medication Sig Dispense Refill   allopurinol (ZYLOPRIM) 100 MG tablet Take 1 tablet (100 mg total) by mouth daily. 90 tablet 1   amLODipine (NORVASC) 10 MG tablet TAKE 1 TABLET BY MOUTH DAILY 90 tablet 1   atorvastatin (LIPITOR) 10 MG tablet TAKE 1 TABLET BY MOUTH DAILY 90 tablet 1   carvedilol (COREG) 6.25 MG tablet Take 1 tablet (6.25 mg total) by mouth 2 (two) times daily with a meal. 180 tablet 1   losartan (COZAAR) 50 MG tablet Take 1 tablet (50 mg total) by mouth daily. 90 tablet 1   oxyCODONE (OXY IR/ROXICODONE) 5 MG immediate release tablet Take 1 tablet (5 mg total) by mouth every 4 (four) hours as needed for moderate pain. (Patient not taking: Reported on 07/18/2023) 30 tablet 0   Current Facility-Administered Medications  Medication Dose Route Frequency Provider Last Rate Last Admin   0.9 %  sodium chloride infusion  500 mL Intravenous Once Milissa Fesperman, Carie Caddy, MD        Allergies as of 07/18/2023 - Review Complete 07/18/2023  Allergen Reaction Noted   Lisinopril Cough 08/20/2017    Family History  Problem Relation Age of Onset   Healthy Mother  Hypertension Father    Colon cancer Neg Hx    Esophageal cancer Neg Hx    Rectal cancer Neg Hx    Stomach cancer Neg Hx    Colon polyps Neg Hx     Social History   Socioeconomic History   Marital status: Legally Separated    Spouse name: Not on file   Number of children: Not on file   Years of education: Not on file   Highest education level: Not on file  Occupational History   Not on file  Tobacco Use   Smoking status: Never   Smokeless tobacco: Never  Vaping Use   Vaping status: Never Used  Substance and Sexual Activity   Alcohol use: Yes    Alcohol/week: 14.0 standard drinks of alcohol    Types: 14 Cans of beer per week    Comment: reports drinks daily   Drug  use: No   Sexual activity: Not Currently  Other Topics Concern   Not on file  Social History Narrative   Not on file   Social Drivers of Health   Financial Resource Strain: Not on file  Food Insecurity: No Food Insecurity (03/28/2023)   Hunger Vital Sign    Worried About Running Out of Food in the Last Year: Never true    Ran Out of Food in the Last Year: Never true  Transportation Needs: No Transportation Needs (03/28/2023)   PRAPARE - Administrator, Civil Service (Medical): No    Lack of Transportation (Non-Medical): No  Physical Activity: Not on file  Stress: Not on file  Social Connections: Not on file  Intimate Partner Violence: Not At Risk (03/28/2023)   Humiliation, Afraid, Rape, and Kick questionnaire    Fear of Current or Ex-Partner: No    Emotionally Abused: No    Physically Abused: No    Sexually Abused: No    Physical Exam: Vital signs in last 24 hours: @BP  (!) 146/90   Pulse (!) 112   Temp 98 F (36.7 C) (Skin)   Ht 5\' 10"  (1.778 m)   Wt 215 lb (97.5 kg)   SpO2 97%   BMI 30.85 kg/m  GEN: NAD EYE: Sclerae anicteric ENT: MMM CV: Non-tachycardic Pulm: CTA b/l GI: Soft, NT/ND NEURO:  Alert & Oriented x 3   Erick Blinks, MD [facility]  07/18/2023 4:18 PM

## 2023-07-18 NOTE — Progress Notes (Signed)
 Report to PACU, RN, vss, BBS= Clear.

## 2023-07-18 NOTE — Progress Notes (Signed)
 Pt's states no medical or surgical changes since previsit or office visit.

## 2023-07-19 ENCOUNTER — Telehealth: Payer: Self-pay | Admitting: *Deleted

## 2023-07-19 NOTE — Telephone Encounter (Signed)
  Follow up Call-     07/18/2023    2:59 PM  Call back number  Post procedure Call Back phone  # [phone]  Permission to leave phone message Yes     Patient questions:  Do you have a fever, pain , or abdominal swelling? No. Pain Score  0 *  Have you tolerated food without any problems? Yes.    Have you been able to return to your normal activities? Yes.    Do you have any questions about your discharge instructions: Diet   No. Medications  No. Follow up visit  No.  Do you have questions or concerns about your Care? No.  Actions: * If pain score is 4 or above: No action needed, pain <4.

## 2023-07-22 ENCOUNTER — Telehealth (INDEPENDENT_AMBULATORY_CARE_PROVIDER_SITE_OTHER): Payer: Self-pay | Admitting: Primary Care

## 2023-07-22 NOTE — Telephone Encounter (Signed)
 Spoke to pt about appt.. Will be present

## 2023-07-29 ENCOUNTER — Encounter (INDEPENDENT_AMBULATORY_CARE_PROVIDER_SITE_OTHER): Payer: Self-pay | Admitting: Primary Care

## 2023-07-29 ENCOUNTER — Ambulatory Visit (INDEPENDENT_AMBULATORY_CARE_PROVIDER_SITE_OTHER): Admitting: Primary Care

## 2023-07-29 VITALS — BP 136/100 | HR 95 | Resp 16 | Wt 211.4 lb

## 2023-07-29 DIAGNOSIS — I10 Essential (primary) hypertension: Secondary | ICD-10-CM

## 2023-07-29 DIAGNOSIS — E119 Type 2 diabetes mellitus without complications: Secondary | ICD-10-CM | POA: Diagnosis not present

## 2023-07-29 DIAGNOSIS — L600 Ingrowing nail: Secondary | ICD-10-CM | POA: Diagnosis not present

## 2023-07-29 NOTE — Progress Notes (Signed)
 [facility]  Tony Dean, is a 63 y.o. male  ZOX:[id number]  WJX:[id number]  DOB - 09/28/1960  Chief Complaint  Patient presents with   Diabetes   Hypertension       Subjective:   Tony Dean is a 63 y.o. male here today for a follow up visit for Bp ck. Patient forgot to take medication diastolic elevated. Patient has No headache, No chest pain, No abdominal pain - No Nausea, No new weakness tingling or numbness, No Cough - shortness of breath.  Patient informed me when he was at the urologist his blood pressure was normal.  Also since last visit he went to [facility] for significant swelling of his left hand for 2 weeks and unable to work.  He also stated he had swelling in his legs and feet but had improve.  He does have a diagnosis of gout. Diabetes  Hypertension    No problems updated.  Comprehensive ROS Pertinent positive and negative noted in HPI   Allergies  Allergen Reactions   Lisinopril Cough    Past Medical History:  Diagnosis Date   Abnormal electrocardiogram (ECG) (EKG)    Dehydration    Dizziness    DM (diabetes mellitus) (HCC)    ED (erectile dysfunction)    Gout    Hyperlipidemia    Hypertension    Lymphangitis of groin    Scrotal pain    Sprain of left wrist    Urinary retention     Current Outpatient Medications on File Prior to Visit  Medication Sig Dispense Refill   allopurinol (ZYLOPRIM) 100 MG tablet Take 1 tablet (100 mg total) by mouth daily. 90 tablet 1   amLODipine (NORVASC) 10 MG tablet TAKE 1 TABLET BY MOUTH DAILY 90 tablet 1   atorvastatin (LIPITOR) 10 MG tablet TAKE 1 TABLET BY MOUTH DAILY 90 tablet 1   carvedilol (COREG) 6.25 MG tablet Take 1 tablet (6.25 mg total) by mouth 2 (two) times daily with a meal. 180 tablet 1   losartan (COZAAR) 50 MG tablet Take 1 tablet (50 mg total) by mouth daily. 90 tablet 1   oxyCODONE (OXY IR/ROXICODONE) 5 MG immediate release tablet Take 1 tablet (5 mg total) by  mouth every 4 (four) hours as needed for moderate pain. (Patient not taking: Reported on 07/18/2023) 30 tablet 0   No current facility-administered medications on file prior to visit.   Health Maintenance  Topic Date Due   Pneumococcal Vaccination (1 of 2 - PCV) Never done   Eye exam for diabetics  Never done   COVID-19 Vaccine (4 - 2024-25 season) 12/23/2022   Hemoglobin A1C  06/26/2023   Flu Shot  11/22/2023   Yearly kidney health urinalysis for diabetes  03/27/2024   Yearly kidney function blood test for diabetes  04/10/2024   Complete foot exam   07/28/2024   DTaP/Tdap/Td vaccine (2 - Td or Tdap) 07/02/2027   Colon Cancer Screening  07/17/2033   Hepatitis C Screening  Completed   HIV Screening  Completed   Zoster (Shingles) Vaccine  Completed   HPV Vaccine  Aged Out    Objective:   Vitals:   07/29/23 1004 07/29/23 1006  BP: (!) 142/93 (!) 136/100  Pulse: 95   Resp: 16   SpO2: 96%   Weight: 211 lb 6.4 oz (95.9 kg)     Physical Exam Vitals reviewed.  HENT:     Right Ear: External ear normal.     Left Ear: External  ear normal.  Eyes:     Extraocular Movements: Extraocular movements intact.  Pulmonary:     Effort: Pulmonary effort is normal.     Breath sounds: Normal breath sounds.  Abdominal:     General: Bowel sounds are normal. There is distension.     Palpations: Abdomen is soft.  Musculoskeletal:        General: Normal range of motion.     Cervical back: Normal range of motion.  Skin:    General: Skin is warm and dry.  Neurological:     Mental Status: He is alert and oriented to person, place, and time.  Psychiatric:        Mood and Affect: Mood normal.        Behavior: Behavior normal.     Assessment & Plan  [place] was seen today for diabetes and hypertension.  Diagnoses and all orders for this visit:  Essential hypertension BP goal - < 140/90 Explained that having normal blood pressure is the goal and medications are helping to get to goal and  maintain normal blood pressure. DIET: Limit salt intake, read nutrition labels to check salt content, limit fried and high fatty foods  Avoid using multisymptom OTC cold preparations that generally contain sudafed which can rise BP. Consult with pharmacist on best cold relief products to use for persons with HTN EXERCISE Discussed incorporating exercise such as walking - 30 minutes most days of the week and can do in 10 minute intervals    -     CMP14+EGFR  Comprehensive diabetic foot examination, type 2 DM, encounter for [facility]) -     Ambulatory referral to Podiatry  Onychocryptosis -     Ambulatory referral to Podiatry  Type 2 diabetes mellitus without complication, without long-term current use of insulin (HCC) - educated on lifestyle modifications, including but not limited to diet choices and adding exercise to daily routine.   -     CBC with Differential/Platelet -     Lipid panel -     Hemoglobin A1c      Patient have been counseled extensively about nutrition and exercise. Other issues discussed during this visit include: low cholesterol diet, weight control and daily exercise, foot care, annual eye examinations at Ophthalmology, importance of adherence with medications and regular follow-up. We also discussed long term complications of uncontrolled diabetes and hypertension.   Return in about 3 months (around 10/28/2023) for medical conditions.  The patient was given clear instructions to go to ER or return to medical center if symptoms don't improve, worsen or new problems develop. The patient verbalized understanding. The patient was told to call to get lab results if they haven't heard anything in the next week.   This note has been created with Education officer, environmental. Any transcriptional errors are unintentional.   Grayce Sessions, NP 07/29/2023, 10:34 AM

## 2023-07-30 LAB — CMP14+EGFR
ALT: 18 IU/L (ref 0–44)
AST: 15 IU/L (ref 0–40)
Albumin: 4 g/dL (ref 3.9–4.9)
Alkaline Phosphatase: 107 IU/L (ref 44–121)
BUN/Creatinine Ratio: 13 (ref 10–24)
BUN: 25 mg/dL (ref 8–27)
Bilirubin Total: 0.2 mg/dL (ref 0.0–1.2)
CO2: 22 mmol/L (ref 20–29)
Calcium: 9.7 mg/dL (ref 8.6–10.2)
Chloride: 103 mmol/L (ref 96–106)
Creatinine, Ser: 1.86 mg/dL — ABNORMAL HIGH (ref 0.76–1.27)
Globulin, Total: 3.9 g/dL (ref 1.5–4.5)
Glucose: 132 mg/dL — ABNORMAL HIGH (ref 70–99)
Potassium: 5.2 mmol/L (ref 3.5–5.2)
Sodium: 143 mmol/L (ref 134–144)
Total Protein: 7.9 g/dL (ref 6.0–8.5)
eGFR: 40 mL/min/{1.73_m2} — ABNORMAL LOW (ref >59)

## 2023-07-30 LAB — CBC WITH DIFFERENTIAL/PLATELET
Basophils Absolute: 0.1 10*3/uL (ref 0.0–0.2)
Basos: 1 %
EOS (ABSOLUTE): 0.1 10*3/uL (ref 0.0–0.4)
Eos: 2 %
Hematocrit: 37.4 % — ABNORMAL LOW (ref 37.5–51.0)
Hemoglobin: 12.1 g/dL — ABNORMAL LOW (ref 13.0–17.7)
Immature Grans (Abs): 0 10*3/uL (ref 0.0–0.1)
Immature Granulocytes: 1 %
Lymphocytes Absolute: 1.9 10*3/uL (ref 0.7–3.1)
Lymphs: 25 %
MCH: 29.5 pg (ref 26.6–33.0)
MCHC: 32.4 g/dL (ref 31.5–35.7)
MCV: 91 fL (ref 79–97)
Monocytes Absolute: 0.6 10*3/uL (ref 0.1–0.9)
Monocytes: 9 %
Neutrophils Absolute: 4.6 10*3/uL (ref 1.4–7.0)
Neutrophils: 62 %
Platelets: 401 10*3/uL (ref 150–450)
RBC: 4.1 x10E6/uL — ABNORMAL LOW (ref 4.14–5.80)
RDW: 13.3 % (ref 11.6–15.4)
WBC: 7.3 10*3/uL (ref 3.4–10.8)

## 2023-07-30 LAB — LIPID PANEL
Chol/HDL Ratio: 2.8 ratio (ref 0.0–5.0)
Cholesterol, Total: 125 mg/dL (ref 100–199)
HDL: 45 mg/dL (ref >39)
LDL Chol Calc (NIH): 64 mg/dL (ref 0–99)
Triglycerides: 81 mg/dL (ref 0–149)
VLDL Cholesterol Cal: 16 mg/dL (ref 5–40)

## 2023-07-30 LAB — HEMOGLOBIN A1C
Est. average glucose Bld gHb Est-mCnc: 151 mg/dL
Hgb A1c MFr Bld: 6.9 % — ABNORMAL HIGH (ref 4.8–5.6)

## 2023-07-31 ENCOUNTER — Encounter (INDEPENDENT_AMBULATORY_CARE_PROVIDER_SITE_OTHER): Payer: Self-pay | Admitting: Primary Care

## 2023-08-19 ENCOUNTER — Ambulatory Visit (INDEPENDENT_AMBULATORY_CARE_PROVIDER_SITE_OTHER): Admitting: Primary Care

## 2023-08-19 ENCOUNTER — Encounter (INDEPENDENT_AMBULATORY_CARE_PROVIDER_SITE_OTHER): Payer: Self-pay | Admitting: Primary Care

## 2023-08-19 VITALS — BP 134/84 | HR 78 | Resp 16 | Wt 221.4 lb

## 2023-08-19 DIAGNOSIS — Z23 Encounter for immunization: Secondary | ICD-10-CM

## 2023-08-19 DIAGNOSIS — Z0130 Encounter for examination of blood pressure without abnormal findings: Secondary | ICD-10-CM | POA: Diagnosis not present

## 2023-08-19 DIAGNOSIS — N529 Male erectile dysfunction, unspecified: Secondary | ICD-10-CM | POA: Diagnosis not present

## 2023-08-19 DIAGNOSIS — L600 Ingrowing nail: Secondary | ICD-10-CM

## 2023-08-19 DIAGNOSIS — I10 Essential (primary) hypertension: Secondary | ICD-10-CM

## 2023-08-19 MED ORDER — TADALAFIL 10 MG PO TABS: 10.0000 mg | ORAL_TABLET | ORAL | 1 refills | Status: AC | PRN

## 2023-08-19 NOTE — Patient Instructions (Addendum)
 Good morning Mr. Soliman,   This is the [facility] reaching out to you in regards to a referral we have received from Madelyn Schick, NP to get you scheduled to be seen with one of our podiatrists again for an ingrown toenail. We can either schedule you with the podiatrist you last saw in 2023 or with one of our other podiatrists if you did not want to see the same one this time. Please call our office at [phone] to get this appointment scheduled.   Best,   Marval Slice Scheduling Dept. [facility]   Pneumococcal Conjugate Vaccine: What You Need to Know Many vaccine information statements are available in Spanish and other languages. See PromoAge.com.br. 1. Why get vaccinated? Pneumococcal conjugate vaccine can prevent pneumococcal disease. Pneumococcal disease refers to any illness caused by pneumococcal bacteria. These bacteria can cause many types of illnesses, including pneumonia, which is an infection of the lungs. Pneumococcal bacteria are one of the most common causes of pneumonia. Besides pneumonia, pneumococcal bacteria can also cause: Ear infections Sinus infections Meningitis (infection of the tissue covering the brain and spinal cord) Bacteremia (infection of the blood) Anyone can get pneumococcal disease, but children under 55 years old, people with certain medical conditions or other risk factors, and adults 65 years or older are at the highest risk. Most pneumococcal infections are mild. However, some can result in long-term problems, such as brain damage or hearing loss. Meningitis, bacteremia, and pneumonia caused by pneumococcal disease can be fatal. 2. Pneumococcal conjugate vaccine Pneumococcal conjugate vaccine helps protect against bacteria that cause pneumococcal disease. There are three pneumococcal conjugate vaccines (PCV13, PCV15, and PCV20). The different vaccines are recommended for different people based on age and medical  status. Your health care provider can help you determine which type of pneumococcal conjugate vaccine, and how many doses, you should receive. Infants and young children usually need 4 doses of pneumococcal conjugate vaccine. These doses are recommended at 2, 4, 6, and 9-62 months of age. Older children and adolescents might need pneumococcal conjugate vaccine depending on their age and medical conditions or other risk factors if they did not receive the recommended doses as infants or young children. Adults 19 through 104 years old with certain medical conditions or other risk factors who have not already received pneumococcal conjugate vaccine should receive pneumococcal conjugate vaccine. Adults 65 years or older who have not previously received pneumococcal conjugate vaccine should receive pneumococcal conjugate vaccine. Some people with certain medical conditions are also recommended to receive pneumococcal polysaccharide vaccine (a different type of pneumococcal vaccine known as PPSV23). Some adults who have previously received a pneumococcal conjugate vaccine may be recommended to receive another pneumococcal conjugate vaccine. 3. Talk with your health care provider Tell your vaccination provider if the person getting the vaccine: Has had an allergic reaction after a previous dose of any type of pneumococcal conjugate vaccine (PCV13, PCV15, PCV20, or an earlier pneumococcal conjugate vaccine known as PCV7), or to any vaccine containing diphtheria toxoid (for example, DTaP), or has any severe, life-threatening allergies In some cases, your health care provider may decide to postpone pneumococcal conjugate vaccination until a future visit. People with minor illnesses, such as a cold, may be vaccinated. People who are moderately or severely ill should usually wait until they recover. Your health care provider can give you more information. 4. Risks of a vaccine reaction Redness, swelling, pain, or  tenderness where the shot is given, and  fever, loss of appetite, fussiness (irritability), feeling tired, headache, muscle aches, joint pain, and chills can happen after pneumococcal conjugate vaccination. Young children may be at increased risk for seizures caused by fever after a pneumococcal conjugate vaccine if it is administered at the same time as inactivated influenza vaccine. Ask your health care provider for more information. People sometimes faint after medical procedures, including vaccination. Tell your provider if you feel dizzy or have vision changes or ringing in the ears. As with any medicine, there is a very remote chance of a vaccine causing a severe allergic reaction, other serious injury, or death. 5. What if there is a serious problem? An allergic reaction could occur after the vaccinated person leaves the clinic. If you see signs of a severe allergic reaction (hives, swelling of the face and throat, difficulty breathing, a fast heartbeat, dizziness, or weakness), call 9-1-1 and get the person to the nearest hospital. For other signs that concern you, call your health care provider. Adverse reactions should be reported to the Vaccine Adverse Event Reporting System (VAERS). Your health care provider will usually file this report, or you can do it yourself. Visit the VAERS website at www.vaers.LAgents.no or call 551-139-7089. VAERS is only for reporting reactions, and VAERS staff members do not give medical advice. 6. The National Vaccine Injury Compensation Program The [employer] Vaccine Injury Compensation Program (VICP) is a federal program that was created to compensate people who may have been injured by certain vaccines. Claims regarding alleged injury or death due to vaccination have a time limit for filing, which may be as short as two years. Visit the VICP website at SpiritualWord.at or call 270 629 0875 to learn about the program and about filing a claim. 7. How  can I learn more? Ask your health care provider. Call your local or state health department. Visit the website of the Food and Drug Administration (FDA) for vaccine package inserts and additional information at FinderList.no. Contact the Centers for Disease Control and Prevention (CDC): Call (925)812-6290 (1-800-CDC-INFO) or Visit CDC's website at PicCapture.uy. Source: CDC Vaccine Information Statement (Interim) Pneumococcal Conjugate Vaccine (09/01/2021) This same material is available at FootballExhibition.com.br for no charge. This information is not intended to replace advice given to you by your health care provider. Make sure you discuss any questions you have with your health care provider. Document Revised: 07/25/2022 Document Reviewed: 04/30/2022 Elsevier Patient Education  2024 [employer].

## 2023-08-19 NOTE — Progress Notes (Signed)
 [facility]   Tony Dean is a 63 y.o. male presents for hypertension evaluation/blood pressure recheck.  Previous visit patient was not taking blood pressure medication and is taking medication every day as prescribed blood pressure is wnl. He denies shortness of breath, headaches, chest pain or lower extremity edema, sudden onset, vision changes, unilateral weakness, dizziness, paresthesias  He is also concerned about bilateral foot pain and wondering what appointment be.  Note on chart states tried to reach patient on 08/01/2023.  Copied and pasted AVS for patient to call and schedule appointment  Patient reports adherence with medications.   Past Medical History:  Diagnosis Date   Abnormal electrocardiogram (ECG) (EKG)    Dehydration    Dizziness    DM (diabetes mellitus) (HCC)    ED (erectile dysfunction)    Gout    Hyperlipidemia    Hypertension    Lymphangitis of groin    Scrotal pain    Sprain of left wrist    Urinary retention    Past Surgical History:  Procedure Laterality Date   APPENDECTOMY     COLONOSCOPY     INGUINAL HERNIA REPAIR  12/22/1988   right   Allergies  Allergen Reactions   Lisinopril  Cough   Current Outpatient Medications on File Prior to Visit  Medication Sig Dispense Refill   allopurinol  (ZYLOPRIM ) 100 MG tablet Take 1 tablet (100 mg total) by mouth daily. 90 tablet 1   amLODipine  (NORVASC ) 10 MG tablet TAKE 1 TABLET BY MOUTH DAILY 90 tablet 1   atorvastatin  (LIPITOR) 10 MG tablet TAKE 1 TABLET BY MOUTH DAILY 90 tablet 1   carvedilol  (COREG ) 6.25 MG tablet Take 1 tablet (6.25 mg total) by mouth 2 (two) times daily with a meal. 180 tablet 1   losartan  (COZAAR ) 50 MG tablet Take 1 tablet (50 mg total) by mouth daily. 90 tablet 1   oxyCODONE  (OXY IR/ROXICODONE ) 5 MG immediate release tablet Take 1 tablet (5 mg total) by mouth every 4 (four) hours as needed for moderate pain. (Patient not taking: Reported on 08/19/2023) 30 tablet 0    No current facility-administered medications on file prior to visit.   Social History   Socioeconomic History   Marital status: Legally Separated    Spouse name: Not on file   Number of children: Not on file   Years of education: Not on file   Highest education level: Not on file  Occupational History   Not on file  Tobacco Use   Smoking status: Never   Smokeless tobacco: Never  Vaping Use   Vaping status: Never Used  Substance and Sexual Activity   Alcohol use: Yes    Alcohol/week: 14.0 standard drinks of alcohol    Types: 14 Cans of beer per week    Comment: reports drinks daily   Drug use: No   Sexual activity: Not Currently  Other Topics Concern   Not on file  Social History Narrative   Not on file   Social Drivers of Health   Financial Resource Strain: Not on file  Food Insecurity: No Food Insecurity (08/19/2023)   Hunger Vital Sign    Worried About Running Out of Food in the Last Year: Never true    Ran Out of Food in the Last Year: Never true  Transportation Needs: No Transportation Needs (08/19/2023)   PRAPARE - Administrator, Civil Service (Medical): No    Lack of Transportation (Non-Medical): No  Physical Activity: Not on file  Stress: Not on file  Social Connections: Not on file  Intimate Partner Violence: Not At Risk (08/19/2023)   Humiliation, Afraid, Rape, and Kick questionnaire    Fear of Current or Ex-Partner: No    Emotionally Abused: No    Physically Abused: No    Sexually Abused: No   Family History  Problem Relation Age of Onset   Healthy Mother    Hypertension Father    Colon cancer Neg Hx    Esophageal cancer Neg Hx    Rectal cancer Neg Hx    Stomach cancer Neg Hx    Colon polyps Neg Hx    Health Maintenance  Topic Date Due   OPHTHALMOLOGY EXAM  Never done   COVID-19 Vaccine (4 - 2024-25 season) 12/23/2022   INFLUENZA VACCINE  11/22/2023   HEMOGLOBIN A1C  01/28/2024   Diabetic kidney evaluation - Urine ACR   03/27/2024   Diabetic kidney evaluation - eGFR measurement  07/28/2024   FOOT EXAM  07/28/2024   DTaP/Tdap/Td (2 - Td or Tdap) 07/02/2027   Colonoscopy  07/17/2033   Pneumococcal Vaccine 50-59 Years old  Completed   Hepatitis C Screening  Completed   HIV Screening  Completed   Zoster Vaccines- Shingrix   Completed   HPV VACCINES  Aged Out   Meningococcal B Vaccine  Aged Out     OBJECTIVE:  Vitals:   08/19/23 1020 08/19/23 1021  BP: 134/89 134/84  Pulse: 78   Resp: 16   SpO2: 99%   Weight: 221 lb 6.4 oz (100.4 kg)     Physical Exam Vitals reviewed.  Constitutional:      Appearance: He is obese.  HENT:     Head: Normocephalic.  Cardiovascular:     Rate and Rhythm: Normal rate and regular rhythm.  Pulmonary:     Effort: Pulmonary effort is normal.     Breath sounds: Normal breath sounds.  Abdominal:     General: Bowel sounds are normal. There is distension.  Musculoskeletal:        General: Normal range of motion.     Cervical back: Normal range of motion.  Skin:    General: Skin is warm and dry.  Neurological:     Mental Status: He is alert and oriented to person, place, and time.  Psychiatric:        Mood and Affect: Mood normal.        Behavior: Behavior normal.      ROS  Last 3 Office BP readings: BP Readings from Last 3 Encounters:  08/19/23 134/84  07/29/23 (!) 136/100  07/18/23 (!) 148/99    BMET    Component Value Date/Time   NA 143 07/29/2023 1038   K 5.2 07/29/2023 1038   CL 103 07/29/2023 1038   CO2 22 07/29/2023 1038   GLUCOSE 132 (H) 07/29/2023 1038   GLUCOSE 259 (H) 12/23/2021 0104   BUN 25 07/29/2023 1038   CREATININE 1.86 (H) 07/29/2023 1038   CALCIUM  9.7 07/29/2023 1038   CALCIUM  10.2 03/27/2023 1016   GFRNONAA 36 (L) 12/23/2021 0104   GFRAA 48 (L) 05/12/2020 1534    Renal function: Estimated Creatinine Clearance: 48.9 mL/min (A) (by C-G formula based on SCr of 1.86 mg/dL (H)).  Clinical ASCVD: No  The ASCVD Risk score  (Arnett DK, et al., 2019) failed to calculate for the following reasons:   The valid total cholesterol range is 130 to 320 mg/dL  ASCVD risk factors include- [place]   ASSESSMENT & PLAN:  Tony Dean was seen today for blood pressure check.  Diagnoses and all orders for this visit:  Encounter for immunization -     Pneumococcal conjugate vaccine 20-valent   Blood pressure check 2/2 Essential hypertension -Counseled on lifestyle modifications for blood pressure control including reduced dietary sodium, increased exercise, weight reduction and adequate sleep. Also, educated patient about the risk for cardiovascular events, stroke and heart attack. Also counseled patient about the importance of medication adherence. If you participate in smoking, it is important to stop using tobacco as this will increase the risks associated with uncontrolled blood pressure.  Goal BP:  For patients younger than 60: Goal BP < 130/80. For patients 60 and older: Goal BP < 140/90. For patients with diabetes: Goal BP < 130/80. Your most recent BP: 134/84  Minimize salt intake. Minimize alcohol intake  Onychocryptosis  See HPI   Erectile dysfunction associated with vasculopathy -     tadalafil  (CIALIS ) 10 MG tablet; Take 1 tablet (10 mg total) by mouth every other day as needed for erectile dysfunction.     This note has been created with Education officer, environmental. Any transcriptional errors are unintentional.   Marius Siemens, NP 08/19/2023, 10:31 AM

## 2023-08-26 ENCOUNTER — Ambulatory Visit (INDEPENDENT_AMBULATORY_CARE_PROVIDER_SITE_OTHER): Admitting: Podiatry

## 2023-08-26 ENCOUNTER — Encounter: Payer: Self-pay | Admitting: Podiatry

## 2023-08-26 VITALS — Ht 70.0 in | Wt 221.4 lb

## 2023-08-26 DIAGNOSIS — E119 Type 2 diabetes mellitus without complications: Secondary | ICD-10-CM

## 2023-08-26 DIAGNOSIS — M10371 Gout due to renal impairment, right ankle and foot: Secondary | ICD-10-CM | POA: Diagnosis not present

## 2023-08-26 DIAGNOSIS — B351 Tinea unguium: Secondary | ICD-10-CM

## 2023-08-26 MED ORDER — TERBINAFINE HCL 250 MG PO TABS: 250.0000 mg | ORAL_TABLET | Freq: Every day | ORAL | 0 refills | Status: AC

## 2023-08-26 NOTE — Progress Notes (Signed)
 Subjective:  Patient ID: Tony Dean, male    DOB: 11/15/1960,  MRN: [id number]  Chief Complaint  Patient presents with   Nail Problem    Pt is here for [facility].    Discussed the use of AI scribe software for clinical note transcription with the patient, who gave verbal consent to proceed.  History of Present Illness Tony Dean is a 63 year old male with diabetes and gout who presents with foot pain and nail fungus.  He experiences pain in both ankles and knees, with a particular emphasis on the ankles. The pain is especially pronounced in one ankle, which is a new development, and occurs when pressure is applied. This pain impacts his ability to work, as he states it 'beats him up' when he goes to work. He has a history of gout, which could be contributing to the ankle pain. He is not currently on medications for gout but has had previous episodes of gout flares.  He has diabetes, which is relevant to his current foot issues. He is on medication for diabetes, although specific medications are not detailed. No history of liver or kidney issues and he is not currently on blood thinners. He is on cholesterol medication, although the specific medication is not named.  He has nail fungus, specifically on the right hallux nail, which he has not previously treated with medication. He mentions trimming the nail himself over the weekend but finds it difficult to manage. He has not experienced any treatment for the nail fungus prior to this visit.      Objective:    Physical Exam VASCULAR: DP and PT pulse palpable. Foot is warm and well-perfused. Capillary fill time is brisk. DERMATOLOGIC: Normal skin turgor, texture, and temperature. No open lesions, rashes, ulcerations, bruising, petechiae, purpura, or venous stasis changes. Thick and elongated right hallux nail with onychomycosis involving the distal lateral quadrant. NEUROLOGIC: Normal sensation to light touch and pressure. No  paresthesias on examination. ORTHOPEDIC: Edema and swelling around the right ankle. Smooth pain-free range of motion of all examined joints. No ecchymosis or bruising. No gross deformity.   No images are attached to the encounter.    Results Procedure: Nail trimming Description: Thick and elongated right hallux nail with onychomycosis involving the distal lateral quadrant was trimmed.  LABS Creatinine: 1.86 mg/dL (95/62/1308) Liver function tests: Normal (07/29/2023) A1c: 6.9% (07/29/2023)   Assessment:   1. Acute gout due to renal impairment involving right ankle   2. Onychomycosis   3. Encounter for diabetic foot exam [facility])      Plan:  Patient was evaluated and treated and all questions answered.  Assessment and Plan Assessment & Plan Onychomycosis Onychomycosis on the right hallux nail, involving the distal lateral quadrant. The condition appears chronic but is growing out. No prior antifungal treatment. Lamisil (terbinafine) is effective in most cases, with approximately 80% of patients not experiencing side effects. Potential side effects include nausea, cramping, and diarrhea. - Prescribe Lamisil (terbinafine) once daily for 90 days. - Instruct to discontinue medication and notify if side effects occur. - Trim the affected nail.  Gout flare Possible gout flare in the right ankle, presenting with edema and swelling. Gout is suspected due to elevated uric acid levels. Plan to confirm with uric acid level testing and treat with colchicine  if indicated. - Order uric acid level test. - Provide lab work paperwork for [facility] locations. - Prescribe colchicine  if uric acid levels confirm gout flare. - Consider x-rays if swelling does not  improve with medication.  Type 2 diabetes mellitus Type 2 diabetes mellitus with a recent A1c of 6.9%. No issues with blood sugar control during the visit. - Encourage continued management of blood sugar levels.      Return if symptoms  worsen or fail to improve.

## 2023-08-26 NOTE — Patient Instructions (Signed)
  VISIT SUMMARY: During your visit, we discussed your foot pain, nail fungus, and overall management of your diabetes and gout. We reviewed your symptoms and created a plan to address each of your concerns.  YOUR PLAN: -ONYCHOMYCOSIS: Onychomycosis is a fungal infection of the nail. We will treat this with Lamisil (terbinafine) once daily for 90 days. If you experience any side effects such as nausea, cramping, or diarrhea, please discontinue the medication and notify us  immediately. Additionally, continue to trim the affected nail.  -GOUT FLARE: A gout flare is a sudden onset of pain and swelling in the joints due to elevated uric acid levels. We suspect a gout flare in your right ankle. We will confirm this with a uric acid level test. If confirmed, we will prescribe colchicine  to manage the flare. You have been provided with lab work paperwork for [employer] [facility] locations. If the swelling does not improve with medication, we may consider x-rays.  -TYPE 2 DIABETES MELLITUS: Type 2 diabetes mellitus is a condition where your body does not use insulin  properly, leading to high blood sugar levels. Your recent A1c is 6.9%, which indicates good control. Continue managing your blood sugar levels as you have been.  INSTRUCTIONS: Please complete the uric acid level test at a [facility] location as soon as possible. If your uric acid levels confirm a gout flare, we will prescribe colchicine . If you experience any side effects from Lamisil, discontinue use and contact us  immediately. Continue to monitor and manage your blood sugar levels.                      Contains text generated by Abridge.                                 Contains text generated by Abridge.

## 2023-08-28 ENCOUNTER — Ambulatory Visit: Payer: Self-pay

## 2023-08-28 NOTE — Telephone Encounter (Signed)
 Copied from CRM [id number]. Topic: Clinical - Red Word Triage >> Aug 28, 2023  1:17 PM Rennis Case wrote: Red Word that prompted transfer to Nurse Triage: Out of work for two days, gout acting up. Pain in both feet, pain in right foot   Chief Complaint: Bilateral Foot Pain Symptoms: pain Frequency: last week Pertinent Negatives: Patient denies swelling of the calf muscles Disposition: [] ED /[] Urgent Care (no appt availability in office) / [x] Appointment(In office/virtual)/ []  [place] [facility]/ [] Home Care/ [] Refused Recommended Disposition /[] [place] [facility]/ []  Follow-up with PCP Additional Notes: Patient called and advised that he believes he is having a gout flare up in both feet and worse in the right. [facility] and wants to see his PCP due to it still bothering him. Patient did also say he was having some cramps in his calf muscles off and on but denies any swelling to either calf muscle. Appointment is made for tomorrow 08/29/2023 at 10:30 am with his PCP.  Patient is given Care Advice as per protocol and patient is also advised that if anything gets worse to go to the Emergency Room. Patient verbalized understanding.   Reason for Disposition . [1] MODERATE pain (e.g., interferes with normal activities, limping) AND [2] present > 3 days  Answer Assessment - Initial Assessment Questions 1. ONSET: "When did the pain start?"      A week ago 2. LOCATION: "Where is the pain located?"      Both feet 3. PAIN: "How bad is the pain?"    (Scale 1-10; or mild, moderate, severe)  - MILD (1-3): doesn't interfere with normal activities.   - MODERATE (4-7): interferes with normal activities (e.g., work or school) or awakens from sleep, limping.   - SEVERE (8-10): excruciating pain, unable to do any normal activities, unable to walk.      8 per patient 4. WORK OR EXERCISE: "Has there been any recent work or exercise that  involved this part of the body?"      Daily Use 5. CAUSE: "What do you think is causing the foot pain?"     Gout per patient 6. OTHER SYMPTOMS: "Do you have any other symptoms?" (e.g., leg pain, rash, fever, numbness)     Patient states some cramping in his calf muscles sometimes  Protocols used: Foot Pain-A-AH

## 2023-08-28 NOTE — Telephone Encounter (Signed)
 Will forward to provider

## 2023-08-29 ENCOUNTER — Encounter (INDEPENDENT_AMBULATORY_CARE_PROVIDER_SITE_OTHER): Payer: Self-pay | Admitting: Primary Care

## 2023-08-29 ENCOUNTER — Ambulatory Visit (INDEPENDENT_AMBULATORY_CARE_PROVIDER_SITE_OTHER): Payer: Self-pay | Admitting: Primary Care

## 2023-08-29 VITALS — BP 124/87 | HR 96 | Resp 16 | Wt 215.4 lb

## 2023-08-29 DIAGNOSIS — M25561 Pain in right knee: Secondary | ICD-10-CM

## 2023-08-29 DIAGNOSIS — G8929 Other chronic pain: Secondary | ICD-10-CM

## 2023-08-29 LAB — COMPREHENSIVE METABOLIC PANEL WITH GFR
ALT: 14 IU/L (ref 0–44)
AST: 17 IU/L (ref 0–40)
Albumin: 4.1 g/dL (ref 3.9–4.9)
Alkaline Phosphatase: 132 IU/L — ABNORMAL HIGH (ref 44–121)
BUN/Creatinine Ratio: 12 (ref 10–24)
BUN: 24 mg/dL (ref 8–27)
Bilirubin Total: 0.7 mg/dL (ref 0.0–1.2)
CO2: 22 mmol/L (ref 20–29)
Calcium: 9.4 mg/dL (ref 8.6–10.2)
Chloride: 103 mmol/L (ref 96–106)
Creatinine, Ser: 2.05 mg/dL — ABNORMAL HIGH (ref 0.76–1.27)
Globulin, Total: 3.9 g/dL (ref 1.5–4.5)
Glucose: 124 mg/dL — ABNORMAL HIGH (ref 70–99)
Potassium: 4.5 mmol/L (ref 3.5–5.2)
Sodium: 142 mmol/L (ref 134–144)
Total Protein: 8 g/dL (ref 6.0–8.5)
eGFR: 36 mL/min/{1.73_m2} — ABNORMAL LOW (ref >59)

## 2023-08-29 LAB — URIC ACID: Uric Acid: 10.9 mg/dL — ABNORMAL HIGH (ref 3.8–8.4)

## 2023-08-29 NOTE — Progress Notes (Signed)
 [facility]  Tony Dean, is a 63 y.o. male  ZOX:[id number]  [id number]  DOB - 1960-12-11  Chief Complaint  Patient presents with   Knee Pain    right       Subjective:   Tony Dean is a 63 y.o. male here today for an acute visit.  Knee Pain  The incident occurred more than 1 week ago (right knee and right and left ankles). The pain is present in the right knee (bilateral ankles). The quality of the pain is described as aching, burning, cramping, shooting and stabbing. The pain is at a severity of 9/10. The pain is severe. The pain has been Constant since onset. Associated symptoms include an inability to bear weight, a loss of motion, numbness and tingling. Associated symptoms comments: limping. The symptoms are aggravated by movement and weight bearing. He has tried rest and heat for the symptoms. The treatment provided mild relief.    No problems updated.  Comprehensive ROS Pertinent positive and negative noted in HPI   Allergies  Allergen Reactions   Lisinopril  Cough    Past Medical History:  Diagnosis Date   Abnormal electrocardiogram (ECG) (EKG)    Dehydration    Dizziness    DM (diabetes mellitus) (HCC)    ED (erectile dysfunction)    Gout    Hyperlipidemia    Hypertension    Lymphangitis of groin    Scrotal pain    Sprain of left wrist    Urinary retention     Current Outpatient Medications on File Prior to Visit  Medication Sig Dispense Refill   allopurinol  (ZYLOPRIM ) 100 MG tablet Take 1 tablet (100 mg total) by mouth daily. 90 tablet 1   amLODipine  (NORVASC ) 10 MG tablet TAKE 1 TABLET BY MOUTH DAILY 90 tablet 1   atorvastatin  (LIPITOR) 10 MG tablet TAKE 1 TABLET BY MOUTH DAILY 90 tablet 1   carvedilol  (COREG ) 6.25 MG tablet Take 1 tablet (6.25 mg total) by mouth 2 (two) times daily with a meal. 180 tablet 1   losartan  (COZAAR ) 50 MG tablet Take 1 tablet (50 mg total) by mouth daily. 90 tablet 1   methylPREDNISolone   (MEDROL  DOSEPAK) 4 MG TBPK tablet 6 day dose pack - take as directed 21 tablet 0   tadalafil  (CIALIS ) 10 MG tablet Take 1 tablet (10 mg total) by mouth every other day as needed for erectile dysfunction. 10 tablet 1   terbinafine  (LAMISIL ) 250 MG tablet Take 1 tablet (250 mg total) by mouth daily. 90 tablet 0   No current facility-administered medications on file prior to visit.   Health Maintenance  Topic Date Due   Eye exam for diabetics  Never done   COVID-19 Vaccine (4 - 2024-25 season) 12/23/2022   Flu Shot  11/22/2023   Hemoglobin A1C  01/28/2024   Yearly kidney health urinalysis for diabetes  03/27/2024   Complete foot exam   07/28/2024   Yearly kidney function blood test for diabetes  08/27/2024   DTaP/Tdap/Td vaccine (2 - Td or Tdap) 07/02/2027   Colon Cancer Screening  07/17/2033   Pneumococcal Vaccination  Completed   Hepatitis C Screening  Completed   HIV Screening  Completed   Zoster (Shingles) Vaccine  Completed   HPV Vaccine  Aged Out   Meningitis B Vaccine  Aged Out    Objective:   Vitals:   08/29/23 1135 08/29/23 1141  BP: (!) 132/92 124/87  Pulse: 96   Resp: 16   SpO2: 97%  Weight: 215 lb 6.4 oz (97.7 kg)    BP Readings from Last 3 Encounters:  08/29/23 124/87  08/19/23 134/84  07/29/23 (!) 136/100   Physical Exam Constitutional:      Appearance: He is obese.  HENT:     Head: Normocephalic.     Right Ear: External ear normal.     Left Ear: External ear normal.  Eyes:     Extraocular Movements: Extraocular movements intact.  Cardiovascular:     Rate and Rhythm: Normal rate and regular rhythm.  Pulmonary:     Effort: Pulmonary effort is normal.     Breath sounds: Normal breath sounds.  Abdominal:     General: Bowel sounds are normal. There is distension.     Palpations: Abdomen is soft.  Musculoskeletal:     Cervical back: Normal range of motion.     Comments: Decrease ROM right side and weakness  Skin:    General: Skin is warm and dry.   Neurological:     Mental Status: He is alert and oriented to person, place, and time.  Psychiatric:        Mood and Affect: Mood normal.        Behavior: Behavior normal.    Assessment & Plan   Gant was seen today for knee pain.  Diagnoses and all orders for this visit:  Chronic pain of right knee -     AMB referral to orthopedics  Patient have been counseled extensively about nutrition and exercise. Other issues discussed during this visit include: low cholesterol diet, weight control and daily exercise, foot care, annual eye examinations at Ophthalmology, importance of adherence with medications and regular follow-up. We also discussed long term complications of uncontrolled diabetes and hypertension.   Return in about 3 months (around 11/29/2023) for HTN.  The patient was given clear instructions to go to ER or return to medical center if symptoms don't improve, worsen or new problems develop. The patient verbalized understanding. The patient was told to call to get lab results if they haven't heard anything in the next week.   This note has been created with Education officer, environmental. Any transcriptional errors are unintentional.   Tony Siemens, NP 09/02/2023, 9:14 PM

## 2023-08-30 NOTE — Telephone Encounter (Signed)
 Pt had an appt this week with provider

## 2023-09-02 ENCOUNTER — Encounter: Payer: Self-pay | Admitting: Podiatry

## 2023-09-02 ENCOUNTER — Other Ambulatory Visit (INDEPENDENT_AMBULATORY_CARE_PROVIDER_SITE_OTHER): Payer: Self-pay | Admitting: Primary Care

## 2023-09-02 DIAGNOSIS — Z760 Encounter for issue of repeat prescription: Secondary | ICD-10-CM

## 2023-09-02 DIAGNOSIS — I10 Essential (primary) hypertension: Secondary | ICD-10-CM

## 2023-09-02 MED ORDER — METHYLPREDNISOLONE 4 MG PO TBPK: ORAL_TABLET | ORAL | 0 refills | Status: DC

## 2023-09-02 NOTE — Addendum Note (Signed)
 Addended byMichalene Agee, Delrick Dehart R on: 09/02/2023 09:44 AM   Modules accepted: Orders

## 2023-09-04 NOTE — Telephone Encounter (Signed)
 Requested Prescriptions  Pending Prescriptions Disp Refills   carvedilol  (COREG ) 6.25 MG tablet [Pharmacy Med Name: CARVEDILOL  6.25 MG TABLET] 180 tablet 0    Sig: TAKE 1 TABLET BY MOUTH TWICE A DAY WITH A MEAL     Cardiovascular: Beta Blockers 3 Failed - 09/04/2023 11:24 AM      Failed - Cr in normal range and within 360 days    Creatinine, Ser  Date Value Ref Range Status  08/28/2023 2.05 (H) 0.76 - 1.27 mg/dL Final   Creatinine, POC  Date Value Ref Range Status  03/27/2023 422.6 mg/dL Final    Comment:    ABSTRACTED BY HIM         Passed - AST in normal range and within 360 days    AST  Date Value Ref Range Status  08/28/2023 17 0 - 40 IU/L Final         Passed - ALT in normal range and within 360 days    ALT  Date Value Ref Range Status  08/28/2023 14 0 - 44 IU/L Final         Passed - Last BP in normal range    BP Readings from Last 1 Encounters:  08/29/23 124/87         Passed - Last Heart Rate in normal range    Pulse Readings from Last 1 Encounters:  08/29/23 96         Passed - Valid encounter within last 6 months    Recent Outpatient Visits           6 days ago Chronic pain of right knee   [place] [facility] Marius Siemens, NP   2 weeks ago Essential hypertension   [place] [facility] Marius Siemens, NP   1 month ago Essential hypertension   [place] [facility] Marius Siemens, NP   2 months ago Bilateral epididymitis   [place] [facility] Marius Siemens, NP   5 months ago Encounter for immunization   [facility] Marius Siemens, NP

## 2023-09-26 ENCOUNTER — Ambulatory Visit (INDEPENDENT_AMBULATORY_CARE_PROVIDER_SITE_OTHER): Admitting: Primary Care

## 2023-09-26 ENCOUNTER — Encounter (INDEPENDENT_AMBULATORY_CARE_PROVIDER_SITE_OTHER): Payer: Self-pay | Admitting: Primary Care

## 2023-09-26 VITALS — BP 148/96 | HR 97 | Resp 16 | Wt 210.2 lb

## 2023-09-26 DIAGNOSIS — I10 Essential (primary) hypertension: Secondary | ICD-10-CM | POA: Diagnosis not present

## 2023-09-26 DIAGNOSIS — Z8739 Personal history of other diseases of the musculoskeletal system and connective tissue: Secondary | ICD-10-CM

## 2023-09-26 DIAGNOSIS — Z760 Encounter for issue of repeat prescription: Secondary | ICD-10-CM

## 2023-09-26 DIAGNOSIS — M109 Gout, unspecified: Secondary | ICD-10-CM

## 2023-09-26 MED ORDER — CARVEDILOL 6.25 MG PO TABS
6.2500 mg | ORAL_TABLET | Freq: Two times a day (BID) | ORAL | 1 refills | Status: DC
Start: 2023-09-26 — End: 2023-11-18

## 2023-09-26 MED ORDER — ALLOPURINOL 100 MG PO TABS: 100.0000 mg | ORAL_TABLET | Freq: Every day | ORAL | 1 refills | Status: DC

## 2023-09-26 NOTE — Progress Notes (Signed)
 [facility]  Tony Dean, is a 63 y.o. male  QIO:[id number]  [id number]  DOB - Nov 17, 1960  Chief Complaint  Patient presents with   Edema    Hand    Gout       Subjective:   Tony Dean is a 63 y.o. male here today for an acute visit. Explained again that gout is an inflammatory arthritis uric acid being too high. Every time you drink your beers increase your attacks. .Decrease red meats and seafoods, alcohol smoking, and obesity greater than 30  Medication is prescribed but only can do so much when you have increase intake of purines.  No problems updated.  Comprehensive ROS Pertinent positive and negative noted in HPI   Allergies  Allergen Reactions   Lisinopril  Cough    Past Medical History:  Diagnosis Date   Abnormal electrocardiogram (ECG) (EKG)    Dehydration    Dizziness    DM (diabetes mellitus) (HCC)    ED (erectile dysfunction)    Gout    Hyperlipidemia    Hypertension    Lymphangitis of groin    Scrotal pain    Sprain of left wrist    Urinary retention     Current Outpatient Medications on File Prior to Visit  Medication Sig Dispense Refill   allopurinol  (ZYLOPRIM ) 100 MG tablet Take 1 tablet (100 mg total) by mouth daily. 90 tablet 1   amLODipine  (NORVASC ) 10 MG tablet TAKE 1 TABLET BY MOUTH DAILY 90 tablet 1   atorvastatin  (LIPITOR) 10 MG tablet TAKE 1 TABLET BY MOUTH DAILY 90 tablet 1   carvedilol  (COREG ) 6.25 MG tablet TAKE 1 TABLET BY MOUTH TWICE A DAY WITH A MEAL 180 tablet 0   losartan  (COZAAR ) 50 MG tablet Take 1 tablet (50 mg total) by mouth daily. 90 tablet 1   tadalafil  (CIALIS ) 10 MG tablet Take 1 tablet (10 mg total) by mouth every other day as needed for erectile dysfunction. 10 tablet 1   terbinafine  (LAMISIL ) 250 MG tablet Take 1 tablet (250 mg total) by mouth daily. 90 tablet 0   No current facility-administered medications on file prior to visit.   Health Maintenance  Topic Date Due   Eye exam for  diabetics  Never done   COVID-19 Vaccine (4 - 2024-25 season) 12/23/2022   Flu Shot  11/22/2023   Hemoglobin A1C  01/28/2024   Yearly kidney health urinalysis for diabetes  03/27/2024   Complete foot exam   07/28/2024   Yearly kidney function blood test for diabetes  08/27/2024   DTaP/Tdap/Td vaccine (2 - Td or Tdap) 07/02/2027   Colon Cancer Screening  07/17/2033   Pneumococcal Vaccination  Completed   Hepatitis C Screening  Completed   HIV Screening  Completed   Zoster (Shingles) Vaccine  Completed   HPV Vaccine  Aged Out   Meningitis B Vaccine  Aged Out    Objective:   Vitals:   09/26/23 1513  BP: (!) 149/100  Pulse: 97  Resp: 16  SpO2: 100%  Weight: 210 lb 3.2 oz (95.3 kg)   BP Readings from Last 3 Encounters:  09/26/23 (!) 149/100  08/29/23 124/87  08/19/23 134/84      Physical Exam Vitals reviewed.  Constitutional:      Appearance: Normal appearance. He is obese.  HENT:     Head: Normocephalic.     Right Ear: External ear normal.     Left Ear: External ear normal.  Eyes:     Extraocular  Movements: Extraocular movements intact.  Cardiovascular:     Rate and Rhythm: Normal rate and regular rhythm.  Pulmonary:     Effort: Pulmonary effort is normal.     Breath sounds: Normal breath sounds.  Abdominal:     General: Bowel sounds are normal. There is distension.     Palpations: Abdomen is soft.  Musculoskeletal:     Comments: Right hand swollen painful warm  Skin:    General: Skin is warm.     Findings: Erythema present.  Neurological:     Mental Status: He is oriented to person, place, and time.  Psychiatric:        Mood and Affect: Mood normal.        Behavior: Behavior normal.     Assessment & Plan  Horatio was seen today for edema and gout.  Diagnoses and all orders for this visit:  Acute gout of right hand, unspecified cause -     Uric acid -     allopurinol  (ZYLOPRIM ) 100 MG tablet; Take 1 tablet (100 mg total) by mouth daily.  Essential  hypertension DIET: Limit salt intake, read nutrition labels to check salt content, limit fried and high fatty foods  Avoid using multisymptom OTC cold preparations that generally contain sudafed which can rise BP. Consult with pharmacist on best cold relief products to use for persons with HTN EXERCISE Discussed incorporating exercise such as walking - 30 minutes most days of the week and can do in 10 minute intervals     History of gout -     allopurinol  (ZYLOPRIM ) 100 MG tablet; Take 1 tablet (100 mg total) by mouth daily.      Patient have been counseled extensively about nutrition and exercise. Other issues discussed during this visit include: low cholesterol diet, weight control and daily exercise, foot care, annual eye examinations at Ophthalmology, importance of adherence with medications and regular follow-up. We also discussed long term complications of uncontrolled diabetes and hypertension.   Keep schedule appt  The patient was given clear instructions to go to ER or return to medical center if symptoms don't improve, worsen or new problems develop. The patient verbalized understanding. The patient was told to call to get lab results if they haven't heard anything in the next week.   This note has been created with Education officer, environmental. Any transcriptional errors are unintentional.   Marius Siemens, NP 09/26/2023, 3:39 PM

## 2023-09-26 NOTE — Patient Instructions (Signed)
 Gout  Gout is painful swelling of your joints. Gout is a type of arthritis. It is caused by having too much uric acid in your body. Uric acid is a chemical that is made when your body breaks down substances called purines. If your body has too much uric acid, sharp crystals can form and build up in your joints. This causes pain and swelling. Gout attacks can happen quickly and be very painful (acute gout). Over time, the attacks can affect more joints and happen more often (chronic gout). What are the causes? Gout is caused by too much uric acid in your blood. This can happen because: Your kidneys do not remove enough uric acid from your blood. Your body makes too much uric acid. You eat too many foods that are high in purines. These foods include organ meats, some seafood, and beer. Trauma or stress can bring on an attack. What increases the risk? Having a family history of gout. Being male and middle-aged. Being male and having gone through menopause. Having an organ transplant. Taking certain medicines. Having certain conditions, such as: Being very overweight (obese). Lead poisoning. Kidney disease. A skin condition called psoriasis. Other risks include: Losing weight too quickly. Not having enough water in the body (being dehydrated). Drinking alcohol, especially beer. Drinking beverages that are sweetened with a type of sugar called fructose. What are the signs or symptoms? An attack of acute gout often starts at night and usually happens in just one joint. The most common place is the big toe. Other joints that may be affected include joints of the feet, ankle, knee, fingers, wrist, or elbow. Symptoms may include: Very bad pain. Warmth. Swelling. Stiffness. Tenderness. The affected joint may be very painful to touch. Shiny, red, or purple skin. Chills and fever. Chronic gout may cause symptoms more often. More joints may be involved. You may also have white or yellow lumps  (tophi) on your hands or feet or in other areas near your joints. How is this treated? Treatment for an acute attack may include medicines for pain and swelling, such as: NSAIDs, such as ibuprofen. Steroids taken by mouth or injected into a joint. Colchicine. This can be given by mouth or through an IV tube. Treatment to prevent future attacks may include: Taking small doses of NSAIDs or colchicine daily. Using a medicine that reduces uric acid levels in your blood, such as allopurinol. Making changes to your diet. You may need to see a food expert (dietitian) about what to eat and drink to prevent gout. Follow these instructions at home: During a gout attack  If told, put ice on the painful area. To do this: Put ice in a plastic bag. Place a towel between your skin and the bag. Leave the ice on for 20 minutes, 2-3 times a day. Take off the ice if your skin turns bright red. This is very important. If you cannot feel pain, heat, or cold, you have a greater risk of damage to the area. Raise the painful joint above the level of your heart as often as you can. Rest the joint as much as possible. If the joint is in your leg, you may be given crutches. Follow instructions from your doctor about what you cannot eat or drink. Avoiding future gout attacks Eat a low-purine diet. Avoid foods and drinks such as: Liver. Kidney. Anchovies. Asparagus. Herring. Mushrooms. Mussels. Beer. Stay at a healthy weight. If you want to lose weight, talk with your doctor. Do not  lose weight too fast. Start or continue an exercise plan as told by your doctor. Eating and drinking Avoid drinks sweetened by fructose. Drink enough fluids to keep your pee (urine) pale yellow. If you drink alcohol: Limit how much you have to: 0-1 drink a day for women who are not pregnant. 0-2 drinks a day for men. Know how much alcohol is in a drink. In the [place] one drink equals one 12 oz bottle of beer (355 mL), one 5 oz  glass of wine (148 mL), or one 1 oz glass of hard liquor (44 mL). General instructions Take over-the-counter and prescription medicines only as told by your doctor. Ask your doctor if you should avoid driving or using machines while you are taking your medicine. Return to your normal activities when your doctor says that it is safe. Keep all follow-up visits. Where to find more information [employer]: www.niams.http://www.myers.net/ Contact a doctor if: You have another gout attack. You still have symptoms of a gout attack after 10 days of treatment. You have problems (side effects) because of your medicines. You have chills or a fever. You have burning pain when you pee (urinate). You have pain in your lower back or belly. Get help right away if: You have very bad pain. Your pain cannot be controlled. You cannot pee. Summary Gout is painful swelling of the joints. The most common site of pain is the big toe, but it can affect other joints. Medicines and avoiding some foods can help to prevent and treat gout attacks. This information is not intended to replace advice given to you by your health care provider. Make sure you discuss any questions you have with your health care provider. Document Revised: 01/11/2021 Document Reviewed: 01/11/2021 Elsevier Patient Education  2024 [employer].

## 2023-10-20 ENCOUNTER — Other Ambulatory Visit (INDEPENDENT_AMBULATORY_CARE_PROVIDER_SITE_OTHER): Payer: Self-pay | Admitting: Primary Care

## 2023-10-20 DIAGNOSIS — E7841 Elevated Lipoprotein(a): Secondary | ICD-10-CM

## 2023-10-22 NOTE — Telephone Encounter (Signed)
 Requested Prescriptions  Pending Prescriptions Disp Refills   atorvastatin  (LIPITOR) 10 MG tablet [Pharmacy Med Name: ATORVASTATIN  10 MG TABLET] 90 tablet 2    Sig: TAKE 1 TABLET BY MOUTH DAILY     Cardiovascular:  Antilipid - Statins Failed - 10/22/2023 12:45 PM      Failed - Lipid Panel in normal range within the last 12 months    Cholesterol, Total  Date Value Ref Range Status  07/29/2023 125 100 - 199 mg/dL Final   LDL Chol Calc (NIH)  Date Value Ref Range Status  07/29/2023 64 0 - 99 mg/dL Final   HDL  Date Value Ref Range Status  07/29/2023 45 >39 mg/dL Final   Triglycerides  Date Value Ref Range Status  07/29/2023 81 0 - 149 mg/dL Final         Passed - Patient is not pregnant      Passed - Valid encounter within last 12 months    Recent Outpatient Visits           3 weeks ago Acute gout of right hand, unspecified cause    [place] [facility] Celestia Rosaline SQUIBB, NP   1 month ago Chronic pain of right knee   [place] [facility] Celestia Rosaline SQUIBB, NP   2 months ago Essential hypertension   [place] [facility] Celestia Rosaline SQUIBB, NP   2 months ago Essential hypertension   [place] [facility] Celestia Rosaline SQUIBB, NP   3 months ago Bilateral epididymitis   [place] [facility] Celestia Rosaline SQUIBB, NP

## 2023-10-28 ENCOUNTER — Ambulatory Visit (INDEPENDENT_AMBULATORY_CARE_PROVIDER_SITE_OTHER): Payer: Self-pay | Admitting: Primary Care

## 2023-11-13 ENCOUNTER — Telehealth (INDEPENDENT_AMBULATORY_CARE_PROVIDER_SITE_OTHER): Payer: Self-pay | Admitting: Primary Care

## 2023-11-13 NOTE — Telephone Encounter (Signed)
 Called pt to confirm appt. Pt did not answer and LVM

## 2023-11-18 ENCOUNTER — Ambulatory Visit (INDEPENDENT_AMBULATORY_CARE_PROVIDER_SITE_OTHER): Admitting: Primary Care

## 2023-11-18 ENCOUNTER — Other Ambulatory Visit (HOSPITAL_COMMUNITY): Payer: Self-pay

## 2023-11-18 ENCOUNTER — Encounter (INDEPENDENT_AMBULATORY_CARE_PROVIDER_SITE_OTHER): Payer: Self-pay | Admitting: Primary Care

## 2023-11-18 VITALS — BP 148/88 | HR 84 | Resp 16 | Wt 214.0 lb

## 2023-11-18 DIAGNOSIS — E7841 Elevated Lipoprotein(a): Secondary | ICD-10-CM

## 2023-11-18 DIAGNOSIS — Z760 Encounter for issue of repeat prescription: Secondary | ICD-10-CM

## 2023-11-18 DIAGNOSIS — M109 Gout, unspecified: Secondary | ICD-10-CM

## 2023-11-18 DIAGNOSIS — I10 Essential (primary) hypertension: Secondary | ICD-10-CM

## 2023-11-18 DIAGNOSIS — F1020 Alcohol dependence, uncomplicated: Secondary | ICD-10-CM | POA: Diagnosis not present

## 2023-11-18 DIAGNOSIS — E119 Type 2 diabetes mellitus without complications: Secondary | ICD-10-CM | POA: Diagnosis not present

## 2023-11-18 DIAGNOSIS — Z8739 Personal history of other diseases of the musculoskeletal system and connective tissue: Secondary | ICD-10-CM

## 2023-11-18 MED ORDER — CARVEDILOL 6.25 MG PO TABS
6.2500 mg | ORAL_TABLET | Freq: Two times a day (BID) | ORAL | 1 refills | Status: DC
  Filled 2023-11-18: qty 180, 90d supply, fill #0

## 2023-11-18 MED ORDER — AMLODIPINE BESYLATE 10 MG PO TABS
10.0000 mg | ORAL_TABLET | Freq: Every day | ORAL | 1 refills | Status: DC
  Filled 2023-11-18: qty 90, 90d supply, fill #0

## 2023-11-18 MED ORDER — ALLOPURINOL 100 MG PO TABS
100.0000 mg | ORAL_TABLET | Freq: Every day | ORAL | 1 refills | Status: AC
  Filled 2023-11-18: qty 90, 90d supply, fill #0

## 2023-11-18 MED ORDER — LOSARTAN POTASSIUM 50 MG PO TABS
50.0000 mg | ORAL_TABLET | Freq: Every day | ORAL | 1 refills | Status: DC
  Filled 2023-11-18: qty 90, 90d supply, fill #0

## 2023-11-18 MED ORDER — ATORVASTATIN CALCIUM 10 MG PO TABS
10.0000 mg | ORAL_TABLET | Freq: Every day | ORAL | 2 refills | Status: AC
  Filled 2023-11-18: qty 90, 90d supply, fill #0

## 2023-11-18 NOTE — Progress Notes (Signed)
 [facility]  Tony Dean, is a 63 y.o. male  RDW:[id number]  FMW:[id number]  DOB - 03/15/1961  Chief Complaint  Patient presents with   Diabetes   Hypertension       Subjective:   Tony Dean is a 63 y.o. male here today for a follow up management of hypertension states he is compliant with taking medication and took it this morning.  Elevated 155/97.  Patient has No headache, No chest pain, No abdominal pain - No Nausea, No new weakness tingling or numbness, No Cough - shortness of breath Type 2 diabetes-Denies polyuria, polydipsia, polyphasia or vision changes.  Does not check blood sugars at home.    No problems updated.  Comprehensive ROS Pertinent positive and negative noted in HPI   Allergies  Allergen Reactions   Lisinopril  Cough    Past Medical History:  Diagnosis Date   Abnormal electrocardiogram (ECG) (EKG)    Dehydration    Dizziness    DM (diabetes mellitus) (HCC)    ED (erectile dysfunction)    Gout    Hyperlipidemia    Hypertension    Lymphangitis of groin    Scrotal pain    Sprain of left wrist    Urinary retention     Current Outpatient Medications on File Prior to Visit  Medication Sig Dispense Refill   tadalafil  (CIALIS ) 10 MG tablet Take 1 tablet (10 mg total) by mouth every other day as needed for erectile dysfunction. 10 tablet 1   No current facility-administered medications on file prior to visit.   Health Maintenance  Topic Date Due   Eye exam for diabetics  Never done   COVID-19 Vaccine (4 - 2024-25 season) 12/23/2022   Flu Shot  11/22/2023   Yearly kidney health urinalysis for diabetes  03/27/2024   Hemoglobin A1C  05/20/2024   Complete foot exam   07/28/2024   Yearly kidney function blood test for diabetes  11/17/2024   DTaP/Tdap/Td vaccine (2 - Td or Tdap) 07/02/2027   Colon Cancer Screening  07/17/2033   Pneumococcal Vaccine for high risk medical condition  Completed   Pneumococcal Vaccine for age  over 51  Completed   Hepatitis C Screening  Completed   HIV Screening  Completed   Zoster (Shingles) Vaccine  Completed   Hepatitis B Vaccine  Aged Out   HPV Vaccine  Aged Out   Meningitis B Vaccine  Aged Out    Objective:   Vitals:   11/18/23 1051 11/18/23 1052 11/18/23 1123  BP: (!) 152/106 (!) 155/97 (!) 148/88  Pulse: 84    Resp: 16    SpO2: 99%    Weight: 214 lb (97.1 kg)     BP Readings from Last 3 Encounters:  11/18/23 (!) 148/88  09/26/23 (!) 148/96  08/29/23 124/87      Physical Exam Vitals reviewed.  Constitutional:      Appearance: He is obese.  HENT:     Head: Normocephalic.     Right Ear: Tympanic membrane and external ear normal.     Left Ear: Tympanic membrane and external ear normal.     Nose: Nose normal.  Eyes:     Extraocular Movements: Extraocular movements intact.     Pupils: Pupils are equal, round, and reactive to light.  Cardiovascular:     Rate and Rhythm: Normal rate and regular rhythm.  Pulmonary:     Effort: Pulmonary effort is normal.     Breath sounds: Normal breath sounds.  Abdominal:  General: Bowel sounds are normal.     Palpations: Abdomen is soft.  Musculoskeletal:        General: Normal range of motion.     Cervical back: Normal range of motion.  Skin:    General: Skin is warm and dry.  Neurological:     Mental Status: He is oriented to person, place, and time.  Psychiatric:        Mood and Affect: Mood normal.        Behavior: Behavior normal.        Thought Content: Thought content normal.        Judgment: Judgment normal.      Assessment & Plan  Tony Dean was seen today for diabetes and hypertension.  Diagnoses and all orders for this visit:  Type 2 diabetes mellitus without complication, without long-term current use of insulin  (HCC) -     CBC with Differential/Platelet -     Hemoglobin A1c -     Ambulatory referral to Ophthalmology  Hypertension, unspecified type -     CMP14+EGFR -     losartan  (COZAAR )  50 MG tablet; Take 1 tablet (50 mg total) by mouth daily. -     carvedilol  (COREG ) 6.25 MG tablet; Take 1 tablet (6.25 mg total) by mouth 2 (two) times daily with a meal. -     amLODipine  (NORVASC ) 10 MG tablet; Take 1 tablet (10 mg total) by mouth daily.  Elevated lipoprotein(a) -     Lipid panel -     atorvastatin  (LIPITOR) 10 MG tablet; Take 1 tablet (10 mg total) by mouth daily.  Alcoholism (HCC)  Medication refill -     losartan  (COZAAR ) 50 MG tablet; Take 1 tablet (50 mg total) by mouth daily. -     carvedilol  (COREG ) 6.25 MG tablet; Take 1 tablet (6.25 mg total) by mouth 2 (two) times daily with a meal. -     amLODipine  (NORVASC ) 10 MG tablet; Take 1 tablet (10 mg total) by mouth daily.  Acute gout of right hand, unspecified cause -     allopurinol  (ZYLOPRIM ) 100 MG tablet; Take 1 tablet (100 mg total) by mouth daily.  History of gout -     allopurinol  (ZYLOPRIM ) 100 MG tablet; Take 1 tablet (100 mg total) by mouth daily.     Patient have been counseled extensively about nutrition and exercise. Other issues discussed during this visit include: low cholesterol diet, weight control and daily exercise, foot care, annual eye examinations at Ophthalmology, importance of adherence with medications and regular follow-up. We also discussed long term complications of uncontrolled diabetes and hypertension.   Return in about 4 weeks (around 12/16/2023) for re-check blood pressure.  The patient was given clear instructions to go to ER or return to medical center if symptoms don't improve, worsen or new problems develop. The patient verbalized understanding. The patient was told to call to get lab results if they haven't heard anything in the next week.   This note has been created with Education officer, environmental. Any transcriptional errors are unintentional.   Rosaline SHAUNNA Bohr, NP 11/25/2023, 2:53 AM

## 2023-11-19 LAB — CMP14+EGFR
ALT: 13 IU/L (ref 0–44)
AST: 17 IU/L (ref 0–40)
Albumin: 4.3 g/dL (ref 3.9–4.9)
Alkaline Phosphatase: 134 IU/L — ABNORMAL HIGH (ref 44–121)
BUN/Creatinine Ratio: 13 (ref 10–24)
BUN: 23 mg/dL (ref 8–27)
Bilirubin Total: 0.5 mg/dL (ref 0.0–1.2)
CO2: 20 mmol/L (ref 20–29)
Calcium: 9.4 mg/dL (ref 8.6–10.2)
Chloride: 102 mmol/L (ref 96–106)
Creatinine, Ser: 1.78 mg/dL — ABNORMAL HIGH (ref 0.76–1.27)
Globulin, Total: 4.3 g/dL (ref 1.5–4.5)
Glucose: 96 mg/dL (ref 70–99)
Potassium: 5.2 mmol/L (ref 3.5–5.2)
Sodium: 140 mmol/L (ref 134–144)
Total Protein: 8.6 g/dL — ABNORMAL HIGH (ref 6.0–8.5)
eGFR: 43 mL/min/1.73 — ABNORMAL LOW (ref >59)

## 2023-11-19 LAB — CBC WITH DIFFERENTIAL/PLATELET
Basophils Absolute: 0.1 x10E3/uL (ref 0.0–0.2)
Basos: 1 %
EOS (ABSOLUTE): 0.1 x10E3/uL (ref 0.0–0.4)
Eos: 1 %
Hematocrit: 41.2 % (ref 37.5–51.0)
Hemoglobin: 13.1 g/dL (ref 13.0–17.7)
Immature Grans (Abs): 0 x10E3/uL (ref 0.0–0.1)
Immature Granulocytes: 0 %
Lymphocytes Absolute: 1.7 x10E3/uL (ref 0.7–3.1)
Lymphs: 22 %
MCH: 30 pg (ref 26.6–33.0)
MCHC: 31.8 g/dL (ref 31.5–35.7)
MCV: 94 fL (ref 79–97)
Monocytes Absolute: 0.7 x10E3/uL (ref 0.1–0.9)
Monocytes: 9 %
Neutrophils Absolute: 5.2 x10E3/uL (ref 1.4–7.0)
Neutrophils: 67 %
Platelets: 283 x10E3/uL (ref 150–450)
RBC: 4.37 x10E6/uL (ref 4.14–5.80)
RDW: 15.2 % (ref 11.6–15.4)
WBC: 7.6 x10E3/uL (ref 3.4–10.8)

## 2023-11-19 LAB — LIPID PANEL
Chol/HDL Ratio: 2.1 ratio (ref 0.0–5.0)
Cholesterol, Total: 184 mg/dL (ref 100–199)
HDL: 87 mg/dL (ref >39)
LDL Chol Calc (NIH): 86 mg/dL (ref 0–99)
Triglycerides: 58 mg/dL (ref 0–149)
VLDL Cholesterol Cal: 11 mg/dL (ref 5–40)

## 2023-11-19 LAB — HEMOGLOBIN A1C
Est. average glucose Bld gHb Est-mCnc: 137 mg/dL
Hgb A1c MFr Bld: 6.4 % — ABNORMAL HIGH (ref 4.8–5.6)

## 2023-11-20 ENCOUNTER — Other Ambulatory Visit (HOSPITAL_COMMUNITY): Payer: Self-pay

## 2023-11-21 ENCOUNTER — Ambulatory Visit: Payer: Self-pay | Admitting: Primary Care

## 2023-12-16 ENCOUNTER — Telehealth (INDEPENDENT_AMBULATORY_CARE_PROVIDER_SITE_OTHER): Payer: Self-pay | Admitting: Primary Care

## 2023-12-16 NOTE — Telephone Encounter (Signed)
 Called pt to confirm appt.Pt did not answer and could not LVM.

## 2023-12-17 ENCOUNTER — Ambulatory Visit (INDEPENDENT_AMBULATORY_CARE_PROVIDER_SITE_OTHER): Admitting: Primary Care

## 2023-12-17 ENCOUNTER — Encounter (INDEPENDENT_AMBULATORY_CARE_PROVIDER_SITE_OTHER): Payer: Self-pay | Admitting: Primary Care

## 2023-12-17 VITALS — BP 156/105 | HR 79 | Temp 98.1°F | Resp 16 | Ht 70.0 in | Wt 212.0 lb

## 2023-12-17 DIAGNOSIS — K029 Dental caries, unspecified: Secondary | ICD-10-CM | POA: Diagnosis not present

## 2023-12-17 DIAGNOSIS — I10 Essential (primary) hypertension: Secondary | ICD-10-CM | POA: Diagnosis not present

## 2023-12-17 DIAGNOSIS — F1020 Alcohol dependence, uncomplicated: Secondary | ICD-10-CM | POA: Diagnosis not present

## 2023-12-17 DIAGNOSIS — M109 Gout, unspecified: Secondary | ICD-10-CM

## 2023-12-17 NOTE — Progress Notes (Signed)
 [facility]  Tony Dean, is a 63 y.o. male  RDW:[id number]  FMW:[id number]  DOB - Jan 28, 1961  Chief Complaint  Patient presents with   Medication Refill    Allopurinol        Subjective:   Tony Dean is a 63 y.o. male here today for a follow up visit.  For blood pressure reevaluation on his medication and has not had any alcohol in the last 2 days.  Blood pressure remains elevated and states adherence with taking his medication.  But not adherent with alcohol use.  He is requesting medication for his gout which has already been sent.  Patient is aware that alcohol all causes gout flareup, red meats, sea foods  obesity, and foods high in purines Gout is an inflammatory arthritis related to a hyperuricemia.  Risk factors greater than 40, male gender, increase in purines red meats and seafoods, alcohol smoking, and obesity .  Patient has No headache, No chest pain, No abdominal pain - No Nausea, No new weakness tingling or numbness, No Cough - shortness of breath Medication Refill    No problems updated.  Comprehensive ROS Pertinent positive and negative noted in HPI   Allergies  Allergen Reactions   Lisinopril  Cough    Past Medical History:  Diagnosis Date   Abnormal electrocardiogram (ECG) (EKG)    Dehydration    Dizziness    DM (diabetes mellitus) (HCC)    ED (erectile dysfunction)    Gout    Hyperlipidemia    Hypertension    Lymphangitis of groin    Scrotal pain    Sprain of left wrist    Urinary retention     Current Outpatient Medications on File Prior to Visit  Medication Sig Dispense Refill   amLODipine  (NORVASC ) 10 MG tablet Take 1 tablet (10 mg total) by mouth daily. 90 tablet 1   atorvastatin  (LIPITOR) 10 MG tablet Take 1 tablet (10 mg total) by mouth daily. 90 tablet 2   carvedilol  (COREG ) 6.25 MG tablet Take 1 tablet (6.25 mg total) by mouth 2 (two) times daily with a meal. 180 tablet 1   losartan  (COZAAR ) 50 MG tablet Take 1  tablet (50 mg total) by mouth daily. 90 tablet 1   allopurinol  (ZYLOPRIM ) 100 MG tablet Take 1 tablet (100 mg total) by mouth daily. 90 tablet 1   tadalafil  (CIALIS ) 10 MG tablet Take 1 tablet (10 mg total) by mouth every other day as needed for erectile dysfunction. 10 tablet 1   No current facility-administered medications on file prior to visit.   Health Maintenance  Topic Date Due   Eye exam for diabetics  Never done   COVID-19 Vaccine (4 - 2024-25 season) 12/23/2022   Flu Shot  11/22/2023   Yearly kidney health urinalysis for diabetes  03/27/2024   Hemoglobin A1C  05/20/2024   Complete foot exam   07/28/2024   Yearly kidney function blood test for diabetes  11/17/2024   DTaP/Tdap/Td vaccine (2 - Td or Tdap) 07/02/2027   Colon Cancer Screening  07/17/2033   Pneumococcal Vaccine for age over 16  Completed   Hepatitis C Screening  Completed   HIV Screening  Completed   Zoster (Shingles) Vaccine  Completed   Hepatitis B Vaccine  Aged Out   HPV Vaccine  Aged Out   Meningitis B Vaccine  Aged Out    Objective:   Vitals:   12/17/23 1109  BP: (!) 156/105  Pulse: 79  Resp: 16  Temp: 98.1  F (36.7 C)  TempSrc: Oral  SpO2: 98%  Weight: 212 lb (96.2 kg)  Height: 5' 10 (1.778 m)   BP Readings from Last 3 Encounters:  12/17/23 (!) 156/105  11/18/23 (!) 148/88  09/26/23 (!) 148/96      Physical Exam Vitals reviewed.  Constitutional:      Appearance: He is obese.  HENT:     Head: Normocephalic.     Comments: Poor dentition     Right Ear: Tympanic membrane and external ear normal.     Left Ear: Tympanic membrane and external ear normal.     Nose: Nose normal.  Eyes:     Extraocular Movements: Extraocular movements intact.  Cardiovascular:     Rate and Rhythm: Normal rate and regular rhythm.  Pulmonary:     Effort: Pulmonary effort is normal.     Breath sounds: Normal breath sounds.  Abdominal:     General: Bowel sounds are normal. There is distension.      Palpations: Abdomen is soft.  Musculoskeletal:        General: Normal range of motion.     Cervical back: Normal range of motion.  Skin:    General: Skin is warm and dry.  Neurological:     Mental Status: He is oriented to person, place, and time.  Psychiatric:        Mood and Affect: Mood normal.        Behavior: Behavior normal.        Thought Content: Thought content normal.        Judgment: Judgment normal.      Assessment & Plan  Tony Dean was seen today for medication refill.  Diagnoses and all orders for this visit:  Alcoholism (HCC) Continues mostly daily   Hypertension, unspecified type Unable to control factor against tx refer to clinical pharmacist  Dental caries -     Ambulatory referral to Dentistry  Acute gout of right hand, unspecified cause Knows what causes flare up!!  He has allopurinol  daily       Patient have been counseled extensively about nutrition and exercise. Other issues discussed during this visit include: low cholesterol diet, weight control and daily exercise, foot care, annual eye examinations at Ophthalmology, importance of adherence with medications and regular follow-up. We also discussed long term complications of uncontrolled diabetes and hypertension.     The patient was given clear instructions to go to ER or return to medical center if symptoms don't improve, worsen or new problems develop. The patient verbalized understanding. The patient was told to call to get lab results if they haven't heard anything in the next week.   This note has been created with Education officer, environmental. Any transcriptional errors are unintentional.   Tony SHAUNNA Bohr, NP 12/17/2023, 11:40 AM

## 2024-01-23 ENCOUNTER — Telehealth: Payer: Self-pay | Admitting: Primary Care

## 2024-01-23 NOTE — Telephone Encounter (Signed)
 Called pt to confirm appt. Pt will be present.

## 2024-01-28 ENCOUNTER — Ambulatory Visit: Attending: Family Medicine | Admitting: Pharmacist

## 2024-01-28 ENCOUNTER — Encounter: Payer: Self-pay | Admitting: Pharmacist

## 2024-01-28 VITALS — BP 129/87 | HR 101

## 2024-01-28 DIAGNOSIS — E119 Type 2 diabetes mellitus without complications: Secondary | ICD-10-CM | POA: Diagnosis not present

## 2024-01-28 DIAGNOSIS — I10 Essential (primary) hypertension: Secondary | ICD-10-CM

## 2024-01-28 DIAGNOSIS — Z79899 Other long term (current) drug therapy: Secondary | ICD-10-CM | POA: Diagnosis not present

## 2024-01-28 MED ORDER — LOSARTAN POTASSIUM 100 MG PO TABS: 100.0000 mg | ORAL_TABLET | Freq: Every day | ORAL | 1 refills | Status: DC

## 2024-01-28 NOTE — Progress Notes (Signed)
 S:     No chief complaint on file.  63 y.o. male who presents for diabetes & HTN evaluation, education, and management. Patient arrives in good spirits and presents without any assistance.   Patient was referred and last seen by Primary Care Provider, Rosaline Bohr, on 12/17/2023. At last visit, BP was 156/105 mmHg. Of note, A1c on 11/18/2023 showed good DM control at 6.4%. CMP done at that visit showed improved kidney function (Scr 1.78 compared to 2.05 in May of this year).  PMH is significant for HTN, T2DM w/ neuropathy, gout with hyperuricemia (UA 10.9 on 08/28/23).   DM: controlled via lifestyle. Does not check sugars at home. Has some neuropathy and vision changes at baseline but no changes at this time.   HTN: denies any chest pain, shortness of breath. Denies any headache or lightheadedness. Takes amlodipine  and losartan  once daily. Only takes the AM dose of carvedilol . No hx of clinical ASCVD, CHF, or stroke. Does have a hx of CKD.   Family/Social History:  -Fhx: HTN -Tobacco: never smoker  -Alcohol: per chart, patient has a hx of alcohol use. At his most recent PCP appt, he endorsed complete abstinence for a couple of days prior to that appt.   Current diabetes medications include: none; controlled via lifestyle Current hypertension medications include: amlodipine  10 mg daily, losartan  50 mg daily, carvedilol  6.25 mg BID (only taking once daily) Current hyperlipidemia medications include: atorvastatin  10 mg  Patient reports adherence to taking all medications as prescribed. Has taken his blood pressure medication  Insurance coverage: [employer]  Patient denies hypoglycemic events.  Reported home fasting blood sugars: none  Reported 2 hour post-meal/random blood sugars: none.  Patient reports polyuria. Patient reports neuropathy (nerve pain). Patient reports baseline visual changes. Patient denies self foot exams.   Patient reported dietary habits:  -Drinks coffee in the  morning.  -Endorses compliance with sodium restriction   Patient-reported exercise habits:  -Walks about ~10-20 minutes daily  O:   Lab Results  Component Value Date   HGBA1C 6.4 (H) 11/18/2023   Vitals:   01/28/24 0848  BP: 129/87  Pulse: (!) 101   Lipid Panel     Component Value Date/Time   CHOL 184 11/18/2023 1125   TRIG 58 11/18/2023 1125   HDL 87 11/18/2023 1125   CHOLHDL 2.1 11/18/2023 1125   LDLCALC 86 11/18/2023 1125   Clinical Atherosclerotic Cardiovascular Disease (ASCVD): No  The 10-year ASCVD risk score (Arnett DK, et al., 2019) is: 22%   Values used to calculate the score:     Age: 10 years     Clincally relevant sex: Male     Is Non-Hispanic African American: Yes     Diabetic: Yes     Tobacco smoker: No     Systolic Blood Pressure: 129 mmHg     Is BP treated: Yes     HDL Cholesterol: 87 mg/dL     Total Cholesterol: 184 mg/dL   Patient is participating in a Managed Medicaid Plan:  Yes   A/P: Diabetes longstanding currently controlled via lifestyle. Commended him for this. He is not currently symptomatic but is able to verbalize appropriate hypoglycemia management plan.  -Extensively discussed pathophysiology of diabetes, recommended lifestyle interventions, dietary effects on blood sugar control.  -Counseled on s/sx of and management of hypoglycemia.  -Next A1c anticipated with next PCP visit.    ASCVD risk - primary prevention in patient with diabetes. Last LDL is not at goal of <70  mg/dL. ASCVD risk score is >79%, PREVENT score: 22.8% for 10 yr CVD risk, 10.8% 10-yr ASCVD risk. In this setting, I recommend high intensity statin. He is currently only on mod intensity. We prioritized HTN care at this time. Will follow-up on this next visit.   -Continued atorvastatin  10 mg daily for now.   Hypertension longstanding currently above goal but improved. Blood pressure goal of <130/80 mmHg. Medication adherence reported to amlodipine  and losartan . We will  increase dose of losartan  from 50 mg to 100 mg daily. This should also help with reducing uric acid levels and preserve remaining kidney function.  -Continued amlodipine  10 mg. -Increase losartan  from 50 to 100 mg daily. Will check CMP in 1 month. -Continue carvedilol  6.25 mg BID   Written patient instructions provided. Patient verbalized understanding of treatment plan.  Total time in face to face counseling 20 minutes.    Follow-up:  Pharmacist in 1 month.  Herlene Fleeta Morris, PharmD, JAQUELINE, CPP Clinical Pharmacist [facility] [phone]

## 2024-01-30 LAB — OPHTHALMOLOGY REPORT-SCANNED

## 2024-03-01 NOTE — Progress Notes (Signed)
 S:     No chief complaint on file.  63 y.o. male who presents for diabetes & HTN evaluation, education, and management. Patient arrives in good spirits and presents without any assistance.   Patient was referred and last seen by Primary Care Provider, Rosaline Bohr, on 12/17/2023. At last visit, BP was 156/105 mmHg. Of note, A1c on 11/18/2023 showed good DM control at 6.4%. CMP done at that visit showed improved kidney function (Scr 1.78 compared to 2.05 in May of this year).  Patient last saw pharmacy on 01/28/24. At last visit, patient reported doing well. BP in clinic was 129/87 mmHg, which is an improvement from recent BP! Losartan  50 mg was increased to 100 mg. Pharmacy was considering increasing atorvastatin  based on ASCVD/CVD risk score and LDL not at goal.  PMH is significant for HTN, T2DM w/ neuropathy, gout with hyperuricemia (UA 10.9 on 08/28/23).   DM: controlled via lifestyle. Does not check sugars at home. Has some neuropathy and vision changes at baseline but no changes at this time.   HTN: patient denies any chest pain, shortness of breath. Denies any headache or lightheadedness. No hx of clinical ASCVD, CHF, or stroke. Does have a hx of CKD.   Patient reports taking his medications daily. He is unable to verbalize which medications he takes. Admits taking carvedilol  once a day and twice when he remembers. Losartan  was changed to 100 mg, but patient reports only taking one tablet of losartan . Patient endorses medications being picked up recently. Per dispense reports, patient has not picked up BP medications since July/August. Denies checking BP at home as he does not have a BP monitor. BP elevated in clinic today.  Family/Social History:  -Fhx: HTN -Tobacco: never smoker  -Alcohol: per chart, patient has a hx of alcohol use. At his most recent PCP appt, he endorsed complete abstinence for a couple of days prior to that appt.   Current diabetes medications include: none;  controlled via lifestyle Current hypertension medications include: amlodipine  10 mg daily, losartan  100 mg daily (unable to tell if he is taking 50 or 100 mg), carvedilol  6.25 mg BID (only taking once daily) Current hyperlipidemia medications include: atorvastatin  10 mg  Patient reports adherence to taking all medications as prescribed. Has taken his blood pressure medication  Insurance coverage: A[employer]na  Patient denies hypoglycemic events.  Reported home fasting blood sugars: none  Reported 2 hour post-meal/random blood sugars: none.  Patient reports polyuria. Patient reports neuropathy (nerve pain). Patient reports baseline visual changes. Patient denies self foot exams.   Patient reported dietary habits:  -Drinks coffee in the morning.  -Endorses compliance with sodium restriction   Patient-reported exercise habits:  -Walks about ~10-20 minutes daily  O:   Lab Results  Component Value Date   HGBA1C 6.4 (H) 11/18/2023   Vitals:   03/02/24 0937  BP: (!) 153/102  Pulse: 98   Lipid Panel     Component Value Date/Time   CHOL 184 11/18/2023 1125   TRIG 58 11/18/2023 1125   HDL 87 11/18/2023 1125   CHOLHDL 2.1 11/18/2023 1125   LDLCALC 86 11/18/2023 1125   Clinical Atherosclerotic Cardiovascular Disease (ASCVD): No  The 10-year ASCVD risk score (Arnett DK, et al., 2019) is: 30.1%   Values used to calculate the score:     Age: 88 years     Clincally relevant sex: Male     Is Non-Hispanic African American: Yes     Diabetic: Yes  Tobacco smoker: No     Systolic Blood Pressure: 153 mmHg     Is BP treated: Yes     HDL Cholesterol: 87 mg/dL     Total Cholesterol: 184 mg/dL   Patient is participating in a Managed Medicaid Plan:  Yes   A/P: Diabetes longstanding currently controlled via lifestyle. Commended him for this. He is not currently symptomatic but is able to verbalize appropriate hypoglycemia management plan.  -Extensively discussed pathophysiology of  diabetes, recommended lifestyle interventions, dietary effects on blood sugar control.  -Counseled on s/sx of and management of hypoglycemia.  -Next A1c anticipated with next PCP visit.    ASCVD risk - primary prevention in patient with diabetes. Last LDL is not at goal of <70 mg/dL. ASCVD risk score is >20%, PREVENT score: 22.8% for 10 yr CVD risk, 10.8% 10-yr ASCVD risk. In this setting, I recommend high intensity statin. He is currently only on mod intensity. We prioritized HTN care at this time. Will follow-up on this next visit.   -Continued atorvastatin  10 mg daily for now.   Hypertension longstanding currently above goal. Blood pressure goal of <130/80 mmHg. Medication adherence reported to amlodipine  and losartan , but dispense records to not show adherence/compliance. CMP was collected today to assess kidney function. Based on BP and lack of compliance via dispense report, plan to combine amlodipine  with ARB. Combination pill will promote adherence and compliance. Recommended patient pick up a BP machine from pharmacy and start checking BP at home. -INITIATE Exforge (amlodipine -valsartan 10-320 mg) once daily. Plan on checking CMP in 1 month with restarting ARB. -DISCONTINUE amlodipine  10 mg. -DISCONTINUE losartan  100 mg -Continue carvedilol  6.25 mg BID - recommended that he start taking this as prescribe  Written patient instructions provided. Patient verbalized understanding of treatment plan.  Total time in face to face counseling 30 minutes.    Follow-up:  PCP visit on 03/18/24 with Rosaline Bohr, NP Pharmacist on 04/02/2024  Jenkins Graces, PharmD PGY1 Pharmacy Resident [phone]

## 2024-03-02 ENCOUNTER — Ambulatory Visit (INDEPENDENT_AMBULATORY_CARE_PROVIDER_SITE_OTHER): Admitting: Primary Care

## 2024-03-02 ENCOUNTER — Other Ambulatory Visit: Payer: Self-pay

## 2024-03-02 ENCOUNTER — Encounter: Payer: Self-pay | Admitting: Pharmacist

## 2024-03-02 ENCOUNTER — Ambulatory Visit: Attending: Primary Care | Admitting: Pharmacist

## 2024-03-02 VITALS — BP 153/102 | HR 98

## 2024-03-02 DIAGNOSIS — Z760 Encounter for issue of repeat prescription: Secondary | ICD-10-CM

## 2024-03-02 DIAGNOSIS — I10 Essential (primary) hypertension: Secondary | ICD-10-CM

## 2024-03-02 MED ORDER — CARVEDILOL 6.25 MG PO TABS
6.2500 mg | ORAL_TABLET | Freq: Two times a day (BID) | ORAL | 1 refills | Status: AC
  Filled 2024-03-02: qty 180, 90d supply, fill #0

## 2024-03-02 MED ORDER — AMLODIPINE BESYLATE-VALSARTAN 10-320 MG PO TABS
1.0000 | ORAL_TABLET | Freq: Every day | ORAL | 1 refills | Status: AC
  Filled 2024-03-02: qty 90, 90d supply, fill #0

## 2024-03-03 LAB — CMP14+EGFR
ALT: 10 IU/L (ref 0–44)
AST: 13 IU/L (ref 0–40)
Albumin: 4 g/dL (ref 3.9–4.9)
Alkaline Phosphatase: 110 IU/L (ref 47–123)
BUN/Creatinine Ratio: 13 (ref 10–24)
BUN: 25 mg/dL (ref 8–27)
Bilirubin Total: 0.3 mg/dL (ref 0.0–1.2)
CO2: 19 mmol/L — ABNORMAL LOW (ref 20–29)
Calcium: 9.6 mg/dL (ref 8.6–10.2)
Chloride: 108 mmol/L — ABNORMAL HIGH (ref 96–106)
Creatinine, Ser: 1.94 mg/dL — ABNORMAL HIGH (ref 0.76–1.27)
Globulin, Total: 4.2 g/dL (ref 1.5–4.5)
Glucose: 105 mg/dL — ABNORMAL HIGH (ref 70–99)
Potassium: 5 mmol/L (ref 3.5–5.2)
Sodium: 145 mmol/L — ABNORMAL HIGH (ref 134–144)
Total Protein: 8.2 g/dL (ref 6.0–8.5)
eGFR: 38 mL/min/1.73 — ABNORMAL LOW (ref >59)

## 2024-03-04 ENCOUNTER — Other Ambulatory Visit: Payer: Self-pay

## 2024-03-12 ENCOUNTER — Ambulatory Visit (INDEPENDENT_AMBULATORY_CARE_PROVIDER_SITE_OTHER): Payer: Self-pay | Admitting: Primary Care

## 2024-03-12 ENCOUNTER — Other Ambulatory Visit (INDEPENDENT_AMBULATORY_CARE_PROVIDER_SITE_OTHER): Payer: Self-pay | Admitting: Primary Care

## 2024-03-12 DIAGNOSIS — R944 Abnormal results of kidney function studies: Secondary | ICD-10-CM

## 2024-03-17 ENCOUNTER — Telehealth (INDEPENDENT_AMBULATORY_CARE_PROVIDER_SITE_OTHER): Payer: Self-pay | Admitting: Primary Care

## 2024-03-17 NOTE — Telephone Encounter (Signed)
Pt will be present.

## 2024-03-18 ENCOUNTER — Encounter (INDEPENDENT_AMBULATORY_CARE_PROVIDER_SITE_OTHER): Payer: Self-pay | Admitting: Primary Care

## 2024-03-18 ENCOUNTER — Ambulatory Visit (INDEPENDENT_AMBULATORY_CARE_PROVIDER_SITE_OTHER): Payer: Self-pay | Admitting: Primary Care

## 2024-03-18 VITALS — BP 134/84 | HR 89 | Resp 16 | Wt 215.8 lb

## 2024-03-18 DIAGNOSIS — I10 Essential (primary) hypertension: Secondary | ICD-10-CM | POA: Diagnosis not present

## 2024-03-18 DIAGNOSIS — E119 Type 2 diabetes mellitus without complications: Secondary | ICD-10-CM | POA: Diagnosis not present

## 2024-03-18 DIAGNOSIS — E7841 Elevated Lipoprotein(a): Secondary | ICD-10-CM | POA: Diagnosis not present

## 2024-03-18 DIAGNOSIS — Z23 Encounter for immunization: Secondary | ICD-10-CM | POA: Diagnosis not present

## 2024-03-18 DIAGNOSIS — Z760 Encounter for issue of repeat prescription: Secondary | ICD-10-CM

## 2024-03-18 NOTE — Progress Notes (Signed)
 [facility]  Tony Dean, is a 63 y.o. male  RDW:[id number]  FMW:[id number]  DOB - 1961-04-17  Chief Complaint  Patient presents with   Hypertension       Subjective:   Tony Dean is a 63 y.o. male here today for a follow up visit. HTN well controlled. Patient has No headache, No chest pain, No abdominal pain - No Nausea, No new weakness tingling or numbness, No Cough - shortness of breath   No problems updated.  Comprehensive ROS Pertinent positive and negative noted in HPI   Allergies  Allergen Reactions   Lisinopril  Cough    Past Medical History:  Diagnosis Date   Abnormal electrocardiogram (ECG) (EKG)    Dehydration    Dizziness    DM (diabetes mellitus) (HCC)    ED (erectile dysfunction)    Gout    Hyperlipidemia    Hypertension    Lymphangitis of groin    Scrotal pain    Sprain of left wrist    Urinary retention     Current Outpatient Medications on File Prior to Visit  Medication Sig Dispense Refill   allopurinol  (ZYLOPRIM ) 100 MG tablet Take 1 tablet (100 mg total) by mouth daily. 90 tablet 1   amLODipine -valsartan  (EXFORGE ) 10-320 MG tablet Take 1 tablet by mouth daily. 90 tablet 1   atorvastatin  (LIPITOR) 10 MG tablet Take 1 tablet (10 mg total) by mouth daily. 90 tablet 2   carvedilol  (COREG ) 6.25 MG tablet Take 1 tablet (6.25 mg total) by mouth 2 (two) times daily with a meal. 180 tablet 1   tadalafil  (CIALIS ) 10 MG tablet Take 1 tablet (10 mg total) by mouth every other day as needed for erectile dysfunction. 10 tablet 1   No current facility-administered medications on file prior to visit.   Health Maintenance  Topic Date Due   COVID-19 Vaccine (4 - 2025-26 season) 12/23/2023   Yearly kidney health urinalysis for diabetes  03/27/2024   Hemoglobin A1C  05/20/2024   Complete foot exam   07/28/2024   Eye exam for diabetics  01/29/2025   Yearly kidney function blood test for diabetes  03/02/2025   DTaP/Tdap/Td vaccine  (2 - Td or Tdap) 07/02/2027   Colon Cancer Screening  07/17/2033   Pneumococcal Vaccine for age over 52  Completed   Flu Shot  Completed   Hepatitis C Screening  Completed   HIV Screening  Completed   Zoster (Shingles) Vaccine  Completed   Hepatitis B Vaccine  Aged Out   HPV Vaccine  Aged Out   Meningitis B Vaccine  Aged Out    Objective:   Vitals:   03/18/24 0848  BP: 134/84  Pulse: 89  Resp: 16  SpO2: 97%  Weight: 215 lb 12.8 oz (97.9 kg)   BP Readings from Last 3 Encounters:  03/18/24 134/84  03/02/24 (!) 153/102  01/28/24 129/87      Physical Exam Vitals reviewed.  Constitutional:      Appearance: He is obese.  HENT:     Head: Normocephalic.     Right Ear: Tympanic membrane, ear canal and external ear normal.     Left Ear: Tympanic membrane, ear canal and external ear normal.     Nose: Nose normal.  Eyes:     Extraocular Movements: Extraocular movements intact.     Pupils: Pupils are equal, round, and reactive to light.  Cardiovascular:     Rate and Rhythm: Normal rate and regular rhythm.  Pulmonary:  Effort: Pulmonary effort is normal.     Breath sounds: Normal breath sounds.  Abdominal:     General: Bowel sounds are normal. There is distension.     Palpations: Abdomen is soft.  Musculoskeletal:        General: Normal range of motion.     Cervical back: Normal range of motion.  Skin:    General: Skin is warm and dry.  Neurological:     Mental Status: He is alert and oriented to person, place, and time.  Psychiatric:        Mood and Affect: Mood normal.        Behavior: Behavior normal.        Thought Content: Thought content normal.        Judgment: Judgment normal.       Assessment & Plan  Tony Dean was seen today for hypertension.  Diagnoses and all orders for this visit:  Encounter for immunization -     Flu vaccine trivalent PF, 6mos and older(Flulaval,Afluria,Fluarix,Fluzone)  Essential hypertension DIET: Limit salt intake, read  nutrition labels to check salt content, limit fried and high fatty foods  Avoid using multisymptom OTC cold preparations that generally contain sudafed which can rise BP. Consult with pharmacist on best cold relief products to use for persons with HTN EXERCISE Discussed incorporating exercise such as walking - 30 minutes most days of the week and can do in 10 minute intervals     Type 2 diabetes mellitus without complication, without long-term current use of insulin  (HCC) monitor carbohydrates -rice, potatoes, tortillas, [employer] Flour, breads, pasta, sweets, sodas.  Increase exercising to help maintain appropriate weight.  -     CBC with Differential/Platelet; Future -     Hemoglobin A1c; Future -     Microalbumin / creatinine urine ratio; Future  Elevated lipoprotein(a) -     Lipid panel; Future      Patient have been counseled extensively about nutrition and exercise. Other issues discussed during this visit include: low cholesterol diet, weight control and daily exercise, foot care, annual eye examinations at Ophthalmology, importance of adherence with medications and regular follow-up. We also discussed long term complications of uncontrolled diabetes and hypertension.   Return in about 3 months (around 06/18/2024) for re-check blood pressure.  The patient was given clear instructions to go to ER or return to medical center if symptoms don't improve, worsen or new problems develop. The patient verbalized understanding. The patient was told to call to get lab results if they haven't heard anything in the next week.   This note has been created with Education officer, environmental. Any transcriptional errors are unintentional.   Tony SHAUNNA Bohr, NP 03/18/2024, 9:29 AM

## 2024-03-18 NOTE — Patient Instructions (Signed)

## 2024-03-27 ENCOUNTER — Telehealth: Payer: Self-pay | Admitting: Primary Care

## 2024-03-27 NOTE — Telephone Encounter (Signed)
 Copied from CRM #[id number]. Topic: Appointments - Scheduling Inquiry for Clinic >> Mar 27, 2024 12:20 PM Larissa S wrote:  Reason for CRM: Patient's sister, Montie,  requesting to cancel appointment for 04/02/24. States she will call back to reschedule.

## 2024-03-27 NOTE — Telephone Encounter (Signed)
Noted, appointment has been canceled.

## 2024-04-02 ENCOUNTER — Ambulatory Visit: Admitting: Pharmacist

## 2024-06-22 ENCOUNTER — Ambulatory Visit (INDEPENDENT_AMBULATORY_CARE_PROVIDER_SITE_OTHER): Admitting: Primary Care
